# Patient Record
Sex: Female | Born: 1935 | Race: White | Hispanic: No | Smoking: Current every day smoker
Health system: Southern US, Community
[De-identification: ages and names within clinical notes are randomized; demographics above are authoritative.]

## PROBLEM LIST (undated history)

## (undated) DIAGNOSIS — I10 Essential (primary) hypertension: Secondary | ICD-10-CM

## (undated) DIAGNOSIS — R32 Unspecified urinary incontinence: Secondary | ICD-10-CM

## (undated) DIAGNOSIS — I2510 Atherosclerotic heart disease of native coronary artery without angina pectoris: Secondary | ICD-10-CM

## (undated) DIAGNOSIS — E1149 Type 2 diabetes mellitus with other diabetic neurological complication: Secondary | ICD-10-CM

## (undated) DIAGNOSIS — M4800 Spinal stenosis, site unspecified: Secondary | ICD-10-CM

## (undated) DIAGNOSIS — I8390 Asymptomatic varicose veins of unspecified lower extremity: Secondary | ICD-10-CM

## (undated) DIAGNOSIS — K219 Gastro-esophageal reflux disease without esophagitis: Secondary | ICD-10-CM

## (undated) DIAGNOSIS — E785 Hyperlipidemia, unspecified: Secondary | ICD-10-CM

## (undated) DIAGNOSIS — M545 Low back pain: Secondary | ICD-10-CM

## (undated) DIAGNOSIS — G459 Transient cerebral ischemic attack, unspecified: Secondary | ICD-10-CM

## (undated) DIAGNOSIS — E876 Hypokalemia: Secondary | ICD-10-CM

## (undated) DIAGNOSIS — Z720 Tobacco use: Secondary | ICD-10-CM

## (undated) DIAGNOSIS — M1990 Unspecified osteoarthritis, unspecified site: Secondary | ICD-10-CM

## (undated) DIAGNOSIS — F0390 Unspecified dementia without behavioral disturbance: Secondary | ICD-10-CM

## (undated) DIAGNOSIS — F329 Major depressive disorder, single episode, unspecified: Secondary | ICD-10-CM

## (undated) HISTORY — DX: Atherosclerotic heart disease of native coronary artery without angina pectoris: I25.10

## (undated) HISTORY — PX: ABDOMINAL HYSTERECTOMY: SHX81

## (undated) HISTORY — PX: VITRECTOMY: SHX106

## (undated) HISTORY — DX: Major depressive disorder, single episode, unspecified: F32.9

## (undated) HISTORY — DX: Unspecified osteoarthritis, unspecified site: M19.90

## (undated) HISTORY — PX: TOTAL KNEE ARTHROPLASTY: SHX125

## (undated) HISTORY — PX: CORONARY STENT PLACEMENT: SHX1402

## (undated) HISTORY — DX: Low back pain: M54.5

## (undated) HISTORY — DX: Gastro-esophageal reflux disease without esophagitis: K21.9

## (undated) HISTORY — DX: Unspecified urinary incontinence: R32

## (undated) HISTORY — DX: Essential (primary) hypertension: I10

## (undated) HISTORY — DX: Hyperlipidemia, unspecified: E78.5

## (undated) HISTORY — PX: VARICOSE VEIN SURGERY: SHX832

---

## 2010-07-20 ENCOUNTER — Encounter: Payer: Self-pay | Admitting: Internal Medicine

## 2010-07-20 ENCOUNTER — Ambulatory Visit
Admission: RE | Admit: 2010-07-20 | Discharge: 2010-07-20 | Payer: Self-pay | Source: Home / Self Care | Attending: Internal Medicine | Admitting: Internal Medicine

## 2010-07-20 DIAGNOSIS — E119 Type 2 diabetes mellitus without complications: Secondary | ICD-10-CM | POA: Insufficient documentation

## 2010-07-20 DIAGNOSIS — I10 Essential (primary) hypertension: Secondary | ICD-10-CM

## 2010-07-20 DIAGNOSIS — M545 Low back pain, unspecified: Secondary | ICD-10-CM

## 2010-07-20 DIAGNOSIS — M1990 Unspecified osteoarthritis, unspecified site: Secondary | ICD-10-CM | POA: Insufficient documentation

## 2010-07-20 DIAGNOSIS — F329 Major depressive disorder, single episode, unspecified: Secondary | ICD-10-CM | POA: Insufficient documentation

## 2010-07-20 DIAGNOSIS — K219 Gastro-esophageal reflux disease without esophagitis: Secondary | ICD-10-CM | POA: Insufficient documentation

## 2010-07-20 DIAGNOSIS — R32 Unspecified urinary incontinence: Secondary | ICD-10-CM

## 2010-07-20 DIAGNOSIS — F3289 Other specified depressive episodes: Secondary | ICD-10-CM

## 2010-07-20 DIAGNOSIS — E1149 Type 2 diabetes mellitus with other diabetic neurological complication: Secondary | ICD-10-CM

## 2010-07-20 DIAGNOSIS — E785 Hyperlipidemia, unspecified: Secondary | ICD-10-CM

## 2010-07-20 DIAGNOSIS — I2510 Atherosclerotic heart disease of native coronary artery without angina pectoris: Secondary | ICD-10-CM | POA: Insufficient documentation

## 2010-07-20 HISTORY — DX: Unspecified osteoarthritis, unspecified site: M19.90

## 2010-07-20 HISTORY — DX: Type 2 diabetes mellitus with other diabetic neurological complication: E11.49

## 2010-07-20 HISTORY — DX: Essential (primary) hypertension: I10

## 2010-07-20 HISTORY — DX: Hyperlipidemia, unspecified: E78.5

## 2010-07-20 HISTORY — DX: Major depressive disorder, single episode, unspecified: F32.9

## 2010-07-20 HISTORY — DX: Unspecified urinary incontinence: R32

## 2010-07-20 HISTORY — DX: Low back pain, unspecified: M54.50

## 2010-07-20 HISTORY — DX: Other specified depressive episodes: F32.89

## 2010-07-20 HISTORY — DX: Atherosclerotic heart disease of native coronary artery without angina pectoris: I25.10

## 2010-07-20 HISTORY — DX: Gastro-esophageal reflux disease without esophagitis: K21.9

## 2010-07-20 LAB — CONVERTED CEMR LAB: Blood Glucose, Fingerstick: 155

## 2010-08-05 NOTE — Letter (Signed)
Summary: PATIENT HX FORM  PATIENT HX FORM   Imported By: Georgian Co 07/20/2010 15:13:27  _____________________________________________________________________  External Attachment:    Type:   Image     Comment:   External Document

## 2010-08-05 NOTE — Assessment & Plan Note (Signed)
Summary: to be re-est/njr   Vital Signs:  Patient profile:   77 year old female Height:      61.5 inches Weight:      150 pounds BMI:     27.98 O2 Sat:      97 % on Room air Temp:     98.0 degrees F oral Pulse rate:   90 / minute BP sitting:   160 / 90  (right arm) Cuff size:   regular  Vitals Entered By: Duard Brady LPN (July 20, 2010 9:31 AM)  O2 Flow:  Room air CC: re-establish - doing well Is Patient Diabetic? Yes CBG Result 155   CC:  re-establish - doing well.  History of Present Illness: 77 -year-old patient who is seen today to reestablish with our practice.  For the past 12 years.  She has been living in [place] and has relocated with her daughter.  Medical problems include coronary artery disease.  She is status post stent in 2008.  Her chief complaint is arthritis with left knee pain and back pain.  She states that she has a history of spinal stenosis.  She has treated hypertension, type 2 diabetes, and dyslipidemia.  She denies any exertional chest pain.  Her glycemic control has been excellent  Here for Medicare AWV:  1.   Risk factors based on Past M, S, F history:  patient has hypertension, dyslipidemia, and known coronary artery disease 2.   Physical Activities: limited due to her advanced arthritis 3.   Depression/mood: history depression, presently on Cymbalta 4.   Hearing: no serious deficits 5.   ADL's: limited but no serious limitations, and independent in aspects of daily living 6.   Fall Risk: moderate due to arthritis and left knee pain 7.   Home Safety: the problems identified 8.   Height, weight, &visual acuity:height and weight stable.  No change in visual acuity is made exam 9.   Counseling: heart healthy diet modest weight loss, and increase activity.  All encouraged 10.   Labs ordered based on risk factors: will check labs next month, when it is time for a hemoglobin A1c 11.           Referral Coordination- refer for diabetic eye  exam 12.           Care Plan- heart healthy diet modest weight loss and exercise.  All encouraged 13.            Cognitive Assessment- alert and appropriate  with normal affect.  No cognitive dysfunction   Preventive Screening-Counseling & Management  Alcohol-Tobacco     Smoking Status: never  Caffeine-Diet-Exercise     Does Patient Exercise: no  Allergies (verified): 1)  ! Penicillin  Past History:  Past Medical History: Coronary artery disease Hyperlipidemia Hypertension Low back pain Osteoarthritis Urinary incontinence Diabetes mellitus, type II Depression GERD history of TIA  Past Surgical History: status post heart catheterization and stent placement 2008 right total knee replacement surgery hysterectomy 1981 vitrectomy  colonoscopy none  Family History: Reviewed history and no changes required. positive for alcoholism arthritis, dyslipidemia, coronary artery disease, Struble, vascular disease, hypertension, and diabetes  Social History: Reviewed history and no changes required. Retired Conservation officer, nature Regular exercise-no Never Smoked Smoking Status:  never Does Patient Exercise:  no  Review of Systems       The patient complains of weight gain, muscle weakness, difficulty walking, and depression.  The patient denies anorexia, fever, weight loss, vision loss, decreased hearing, hoarseness,  chest pain, syncope, dyspnea on exertion, peripheral edema, prolonged cough, headaches, hemoptysis, abdominal pain, melena, hematochezia, severe indigestion/heartburn, hematuria, incontinence, genital sores, suspicious skin lesions, transient blindness, unusual weight change, abnormal bleeding, enlarged lymph nodes, angioedema, and breast masses.    Physical Exam  General:  overweight-appearing.  140/80 Head:  Normocephalic and atraumatic without obvious abnormalities. No apparent alopecia or balding. Eyes:  No corneal or conjunctival inflammation noted. EOMI. Perrla.  Funduscopic exam benign, without hemorrhages, exudates or papilledema. Vision grossly normal. Ears:  External ear exam shows no significant lesions or deformities.  Otoscopic examination reveals clear canals, tympanic membranes are intact bilaterally without bulging, retraction, inflammation or discharge. Hearing is grossly normal bilaterally. Nose:  External nasal examination shows no deformity or inflammation. Nasal mucosa are pink and moist without lesions or exudates. Mouth:  Oral mucosa and oropharynx without lesions or exudates.  Teeth in good repair. Neck:  No deformities, masses, or tenderness noted. Chest Wall:  No deformities, masses, or tenderness noted. Lungs:  Normal respiratory effort, chest expands symmetrically. Lungs are clear to auscultation, no crackles or wheezes. Heart:  Normal rate and regular rhythm. S1 and S2 normal without gallop, murmur, click, rub or other extra sounds. Abdomen:  Bowel sounds positive,abdomen soft and non-tender without masses, organomegaly or hernias noted. Msk:  No deformity or scoliosis noted of thoracic or lumbar spine.   Pulses:  R and L carotid,radial,femoral,dorsalis pedis and posterior tibial pulses are full and equal bilaterally Extremities:  No clubbing, cyanosis, edema, or deformity noted with normal full range of motion of all joints.   Neurologic:  No cranial nerve deficits noted. Station and gait are normal. Plantar reflexes are down-going bilaterally. DTRs are symmetrical throughout. Sensory, motor and coordinative functions appear intact. Skin:  Intact without suspicious lesions or rashes Cervical Nodes:  No lymphadenopathy noted Axillary Nodes:  No palpable lymphadenopathy Inguinal Nodes:  No significant adenopathy Psych:  Cognition and judgment appear intact. Alert and cooperative with normal attention span and concentration. No apparent delusions, illusions, hallucinations  Diabetes Management Exam:    Foot Exam (with socks and/or  shoes not present):       Sensory-Pinprick/Light touch:          Left medial foot (L-4): normal          Left dorsal foot (L-5): normal          Left lateral foot (S-1): normal          Right medial foot (L-4): normal          Right dorsal foot (L-5): normal          Right lateral foot (S-1): normal       Sensory-Monofilament:          Left foot: normal          Right foot: normal       Inspection:          Left foot: normal          Right foot: normal    Eye Exam:       Eye Exam done here today          Results: normal   Impression & Recommendations:  Problem # 1:  DEPRESSION (ICD-311)  Her updated medication list for this problem includes:    Trazodone Hcl 50 Mg Tabs (Trazodone hcl) .Marland Kitchen... At bedtime as needed sleep    Cymbalta 60 Mg Cpep (Duloxetine hcl) ..... Qam  Problem # 2:  DIABETES MELLITUS, TYPE II (  ICD-250.00)  Her updated medication list for this problem includes:    Aspir-low 81 Mg Tbec (Aspirin) ..... Qd    Metformin Hcl 500 Mg Xr24h-tab (Metformin hcl) ..... Qd    Lisinopril-hydrochlorothiazide 20-25 Mg Tabs (Lisinopril-hydrochlorothiazide) ..... Qd  Problem # 3:  CORONARY ARTERY DISEASE (ICD-414.00)  Her updated medication list for this problem includes:    Felodipine 10 Mg Xr24h-tab (Felodipine) ..... Qd    Aspir-low 81 Mg Tbec (Aspirin) ..... Qd    Metoprolol Tartrate 50 Mg Tabs (Metoprolol tartrate) ..... Qd    Lisinopril-hydrochlorothiazide 20-25 Mg Tabs (Lisinopril-hydrochlorothiazide) ..... Qd  Complete Medication List: 1)  Vitamin D (ergocalciferol) 50000 Unit Caps (Ergocalciferol) .... Qmonth 2)  Felodipine 10 Mg Xr24h-tab (Felodipine) .... Qd 3)  Trazodone Hcl 50 Mg Tabs (Trazodone hcl) .... At bedtime as needed sleep 4)  Vesicare 5 Mg Tabs (Solifenacin succinate) .... Qhs 5)  Aspir-low 81 Mg Tbec (Aspirin) .... Qd 6)  Metoprolol Tartrate 50 Mg Tabs (Metoprolol tartrate) .... Qd 7)  Metformin Hcl 500 Mg Xr24h-tab (Metformin hcl) .... Qd 8)   Lisinopril-hydrochlorothiazide 20-25 Mg Tabs (Lisinopril-hydrochlorothiazide) .... Qd 9)  Cymbalta 60 Mg Cpep (Duloxetine hcl) .... Qam 10)  Simvastatin 20 Mg Tabs (Simvastatin) .... Qd 11)  Meloxicam 15 Mg Tabs (Meloxicam) .... Qd  Other Orders: Medicare -1st Annual Wellness Visit [phone])  Patient Instructions: 1)  Please schedule a follow-up appointment in 1 month. 2)  It is important that you exercise regularly at least 20 minutes 5 times a week. If you develop chest pain, have severe difficulty breathing, or feel very tired , stop exercising immediately and seek medical attention. 3)  You need to lose weight. Consider a lower calorie diet and regular exercise.  4)  Check your blood sugars regularly. If your readings are usually above : or below 70 you should contact our office. 5)  It is important that your Diabetic A1c level is checked every 3 months. 6)  See your eye doctor yearly to check for diabetic eye damage. Prescriptions: MELOXICAM 15 MG TABS (MELOXICAM) qd  #90 x 3   Entered and Authorized by:   Gordy Savers  MD   Signed by:   Gordy Savers  MD on 07/20/2010   Method used:   Print then Give to Patient   RxID:   [id number] SIMVASTATIN 20 MG TABS (SIMVASTATIN) qd  #90 x 3   Entered and Authorized by:   Gordy Savers  MD   Signed by:   Gordy Savers  MD on 07/20/2010   Method used:   Print then Give to Patient   RxID:   [id number] CYMBALTA 60 MG CPEP (DULOXETINE HCL) qam  #90 x 3   Entered and Authorized by:   Gordy Savers  MD   Signed by:   Gordy Savers  MD on 07/20/2010   Method used:   Print then Give to Patient   RxID:   [id number] LISINOPRIL-HYDROCHLOROTHIAZIDE 20-25 MG TABS (LISINOPRIL-HYDROCHLOROTHIAZIDE) qd  #90 x 3   Entered and Authorized by:   Gordy Savers  MD   Signed by:   Gordy Savers  MD on 07/20/2010   Method used:   Print then Give to Patient   RxID:   [id number] METFORMIN HCL  500 MG XR24H-TAB (METFORMIN HCL) qd  #90 x 3   Entered and Authorized by:   Gordy Savers  MD   Signed by:   Gordy Savers  MD on 07/20/2010  Method used:   Print then Give to Patient   RxID:   [id number] METOPROLOL TARTRATE 50 MG TABS (METOPROLOL TARTRATE) qd  #90 x 3   Entered and Authorized by:   Gordy Savers  MD   Signed by:   Gordy Savers  MD on 07/20/2010   Method used:   Print then Give to Patient   RxID:   [id number] VESICARE 5 MG TABS (SOLIFENACIN SUCCINATE) qhs  #90 x 3   Entered and Authorized by:   Gordy Savers  MD   Signed by:   Gordy Savers  MD on 07/20/2010   Method used:   Print then Give to Patient   RxID:   [id number] TRAZODONE HCL 50 MG TABS (TRAZODONE HCL) at bedtime as needed sleep  #90 x 3   Entered and Authorized by:   Gordy Savers  MD   Signed by:   Gordy Savers  MD on 07/20/2010   Method used:   Print then Give to Patient   RxID:   [id number] FELODIPINE 10 MG XR24H-TAB (FELODIPINE) qd  #90 x 3   Entered and Authorized by:   Gordy Savers  MD   Signed by:   Gordy Savers  MD on 07/20/2010   Method used:   Print then Give to Patient   RxID:   [id number]    Orders Added: 1)  Medicare -1st Annual Wellness Visit [G0438] 2)  New Patient Level III [phone]

## 2010-08-18 ENCOUNTER — Encounter: Payer: Self-pay | Admitting: Internal Medicine

## 2010-08-20 ENCOUNTER — Ambulatory Visit: Payer: Self-pay | Admitting: Internal Medicine

## 2011-04-05 ENCOUNTER — Other Ambulatory Visit: Payer: Self-pay

## 2011-04-05 MED ORDER — DULOXETINE HCL 60 MG PO CPEP: 60.0000 mg | ORAL_CAPSULE | Freq: Every day | ORAL | Status: DC

## 2011-04-05 MED ORDER — METFORMIN HCL ER 500 MG PO TB24: 500.0000 mg | ORAL_TABLET | Freq: Every day | ORAL | Status: DC

## 2011-04-05 NOTE — Telephone Encounter (Signed)
[facility] - will need to be seen - last seen 07/2010 with instructions to rov in 1 mos

## 2011-04-05 NOTE — Telephone Encounter (Signed)
[facility] - 90day only - was instructed to make 1 mos rov at 07/2010 appt.

## 2011-07-27 ENCOUNTER — Other Ambulatory Visit: Payer: Self-pay

## 2011-07-27 NOTE — Telephone Encounter (Signed)
Rx request for metoprolol tartarte 50 mg #90.

## 2011-07-28 ENCOUNTER — Ambulatory Visit (INDEPENDENT_AMBULATORY_CARE_PROVIDER_SITE_OTHER): Admitting: Internal Medicine

## 2011-07-28 ENCOUNTER — Encounter: Payer: Self-pay | Admitting: Internal Medicine

## 2011-07-28 DIAGNOSIS — E785 Hyperlipidemia, unspecified: Secondary | ICD-10-CM

## 2011-07-28 DIAGNOSIS — I2510 Atherosclerotic heart disease of native coronary artery without angina pectoris: Secondary | ICD-10-CM

## 2011-07-28 DIAGNOSIS — I10 Essential (primary) hypertension: Secondary | ICD-10-CM

## 2011-07-28 DIAGNOSIS — M545 Low back pain, unspecified: Secondary | ICD-10-CM

## 2011-07-28 DIAGNOSIS — E119 Type 2 diabetes mellitus without complications: Secondary | ICD-10-CM

## 2011-07-28 DIAGNOSIS — M1990 Unspecified osteoarthritis, unspecified site: Secondary | ICD-10-CM

## 2011-07-28 LAB — CBC WITH DIFFERENTIAL/PLATELET
Basophils Relative: 0.4 % (ref 0.0–3.0)
Eosinophils Absolute: 0.2 10*3/uL (ref 0.0–0.7)
Eosinophils Relative: 2.2 % (ref 0.0–5.0)
Hemoglobin: 13.8 g/dL (ref 12.0–15.0)
Lymphocytes Relative: 33.2 % (ref 12.0–46.0)
MCHC: 34.7 g/dL (ref 30.0–36.0)
Monocytes Relative: 7.9 % (ref 3.0–12.0)
Neutro Abs: 5.4 10*3/uL (ref 1.4–7.7)
Neutrophils Relative %: 56.3 % (ref 43.0–77.0)
RBC: 4.75 Mil/uL (ref 3.87–5.11)
WBC: 9.7 10*3/uL (ref 4.5–10.5)

## 2011-07-28 LAB — TSH: TSH: 0.46 u[IU]/mL (ref 0.35–5.50)

## 2011-07-28 LAB — COMPREHENSIVE METABOLIC PANEL
AST: 19 U/L (ref 0–37)
BUN: 21 mg/dL (ref 6–23)
Calcium: 9 mg/dL (ref 8.4–10.5)
Chloride: 102 mEq/L (ref 96–112)
Creatinine, Ser: 0.9 mg/dL (ref 0.4–1.2)
Glucose, Bld: 120 mg/dL — ABNORMAL HIGH (ref 70–99)

## 2011-07-28 MED ORDER — METOPROLOL SUCCINATE ER 100 MG PO TB24: 100.0000 mg | ORAL_TABLET | Freq: Every day | ORAL | Status: DC

## 2011-07-28 MED ORDER — MELOXICAM 15 MG PO TABS: 15.0000 mg | ORAL_TABLET | Freq: Every day | ORAL | Status: DC

## 2011-07-28 MED ORDER — LISINOPRIL-HYDROCHLOROTHIAZIDE 20-25 MG PO TABS: 1.0000 | ORAL_TABLET | Freq: Every day | ORAL | Status: DC

## 2011-07-28 MED ORDER — FELODIPINE ER 10 MG PO TB24: 10.0000 mg | ORAL_TABLET | Freq: Every day | ORAL | Status: DC

## 2011-07-28 MED ORDER — SOLIFENACIN SUCCINATE 10 MG PO TABS: 10.0000 mg | ORAL_TABLET | Freq: Every day | ORAL | Status: DC

## 2011-07-28 MED ORDER — SIMVASTATIN 20 MG PO TABS: 20.0000 mg | ORAL_TABLET | Freq: Every day | ORAL | Status: DC

## 2011-07-28 MED ORDER — TRAMADOL HCL 50 MG PO TABS: 50.0000 mg | ORAL_TABLET | Freq: Three times a day (TID) | ORAL | Status: AC | PRN

## 2011-07-28 MED ORDER — TRAZODONE HCL 50 MG PO TABS: 50.0000 mg | ORAL_TABLET | Freq: Every day | ORAL | Status: DC

## 2011-07-28 MED ORDER — METFORMIN HCL ER 500 MG PO TB24: 500.0000 mg | ORAL_TABLET | Freq: Every day | ORAL | Status: DC

## 2011-07-28 NOTE — Progress Notes (Signed)
  Subjective:    Patient ID: Glenda Underwood, female    DOB: 02-Nov-1932, 78 y.o.   MRN: [id number]  HPI  78 year old patient who resides in [place] and has not been seen here in one year. She has multiple medical problems including coronary artery disease. She has not been taking her aspirin faithfully she denies any chest pain. She has a history of type 2 diabetes but does track her blood sugar daily. She is maintained vasculitis in the control she has treated hypertension and dyslipidemia. Her main complaint today is low back pain which has worsened over the past 2 months. She does have a history of spinal stenosis. She requires medication refills today  Review of Systems  Constitutional: Negative for fever, appetite change, fatigue and unexpected weight change.  HENT: Negative for hearing loss, ear pain, nosebleeds, congestion, sore throat, mouth sores, trouble swallowing, neck stiffness, dental problem, voice change, sinus pressure and tinnitus.   Eyes: Negative for photophobia, pain, redness and visual disturbance.  Respiratory: Negative for cough, chest tightness and shortness of breath.   Cardiovascular: Negative for chest pain, palpitations and leg swelling.  Gastrointestinal: Negative for nausea, vomiting, abdominal pain, diarrhea, constipation, blood in stool, abdominal distention and rectal pain.  Genitourinary: Negative for dysuria, urgency, frequency, hematuria, flank pain, vaginal bleeding, vaginal discharge, difficulty urinating, genital sores, vaginal pain, menstrual problem and pelvic pain.  Musculoskeletal: Positive for back pain and arthralgias.  Skin: Negative for rash.  Neurological: Negative for dizziness, syncope, speech difficulty, weakness, light-headedness, numbness and headaches.  Hematological: Negative for adenopathy. Does not bruise/bleed easily.  Psychiatric/Behavioral: Negative for suicidal ideas, behavioral problems, self-injury, dysphoric mood and agitation. The  patient is not nervous/anxious.        Objective:   Physical Exam  Constitutional: She is oriented to person, place, and time. She appears well-developed and well-nourished.  HENT:  Head: Normocephalic.  Right Ear: External ear normal.  Left Ear: External ear normal.  Mouth/Throat: Oropharynx is clear and moist.  Eyes: Conjunctivae and EOM are normal. Pupils are equal, round, and reactive to light.  Neck: Normal range of motion. Neck supple. No thyromegaly present.       Right supraclavicular bruit  Cardiovascular: Normal rate, regular rhythm, normal heart sounds and intact distal pulses.   Pulmonary/Chest: Effort normal and breath sounds normal.  Abdominal: Soft. Bowel sounds are normal. She exhibits no mass. There is no tenderness.  Musculoskeletal: Normal range of motion.  Lymphadenopathy:    She has no cervical adenopathy.  Neurological: She is alert and oriented to person, place, and time.       Diabetic foot exam unremarkable and intact to monofilament testing and vibratory sensation  Skin: Skin is warm and dry. No rash noted.  Psychiatric: She has a normal mood and affect. Her behavior is normal.          Assessment & Plan:    Low back pain/spinal stenosis. We'll give a prescription for tramadol to assist with her pain. We'll maintain on meloxicam as well as Cymbalta Coronary artery disease. We'll resume aspirin Hypertension well controlled Dyslipidemia simvastatin refilled Diabetes mellitus. We'll check a hemoglobin A1c recheck 3 months

## 2011-07-28 NOTE — Patient Instructions (Signed)
Most patients with low back pain will improve with time over the next two to 6 weeks.  Keep active but avoid any activities that cause pain.  Apply moist heat to the low back area several times daily. Use tramadol for pain   Please check your hemoglobin A1c every 3 months Limit your sodium (Salt) intake

## 2011-07-28 NOTE — Telephone Encounter (Signed)
Rx sent to pharmacy electronically.   

## 2011-11-29 ENCOUNTER — Ambulatory Visit: Payer: Medicare Other | Admitting: Internal Medicine

## 2012-01-10 ENCOUNTER — Ambulatory Visit: Payer: Medicare Other | Admitting: Internal Medicine

## 2012-01-10 ENCOUNTER — Other Ambulatory Visit: Payer: Self-pay | Admitting: Internal Medicine

## 2012-01-10 MED ORDER — DULOXETINE HCL 60 MG PO CPEP: 60.0000 mg | ORAL_CAPSULE | Freq: Every day | ORAL | Status: DC

## 2012-01-10 MED ORDER — METFORMIN HCL ER 500 MG PO TB24: 500.0000 mg | ORAL_TABLET | Freq: Every day | ORAL | Status: DC

## 2012-01-10 MED ORDER — METOPROLOL SUCCINATE ER 100 MG PO TB24: 100.0000 mg | ORAL_TABLET | Freq: Every day | ORAL | Status: DC

## 2012-01-10 NOTE — Telephone Encounter (Signed)
Re sent to [facility] in [place]

## 2012-01-10 NOTE — Telephone Encounter (Signed)
Daughter called back. It is the [facility] on [address] in [place]. Thanks!!

## 2012-01-10 NOTE — Telephone Encounter (Signed)
[facility]s to [facility] [address] - I tried to call number given - VM - unable toleave msg - mailbox full. [facility] are 2 [facility] in [place] - is this really where they need meds sent and if so - which one - I need street name or phone #

## 2012-01-10 NOTE — Addendum Note (Signed)
Addended by: Duard Brady I on: 01/10/2012 12:53 PM   Modules accepted: Orders

## 2012-01-10 NOTE — Telephone Encounter (Signed)
Pt could not make it to appt today but is going to be out of medication. Pt requesting a one month refill on  DULoxetine (CYMBALTA) 60 MG capsule, metoprolol succinate (TOPROL-XL) 100 MG 24 hr tablet And metFORMIN (GLUCOPHAGE-XR) 500 MG 24 hr tablet  Please send to [facility] * pt will call back to sched appt for next week

## 2012-01-20 ENCOUNTER — Telehealth: Payer: Self-pay | Admitting: Internal Medicine

## 2012-01-20 MED ORDER — TRAZODONE HCL 50 MG PO TABS: 50.0000 mg | ORAL_TABLET | Freq: Every evening | ORAL | Status: DC | PRN

## 2012-01-20 NOTE — Telephone Encounter (Signed)
Please advise - last seen 07/28/11  Last written 07/28/11 #90 1RF Please advise

## 2012-01-20 NOTE — Telephone Encounter (Signed)
ok 

## 2012-01-20 NOTE — Telephone Encounter (Signed)
Daughter/Jackie calling today 01/20/12 regarding prescription for Trazadone 50 mg, needs a refill called to [facility] [phone], would like a 90 day supply.  Hereafter she needs all prescriptions sent to [facility].  Daughter said she will bring Mom in for a check up in next couple of months.  Mom lives in [place].  PLEASE CALL DAUGHTER BACK AT [phone] TO LET HER KNOW THIS HAS BEEN DONE.

## 2012-02-02 ENCOUNTER — Telehealth: Payer: Self-pay | Admitting: Internal Medicine

## 2012-02-02 MED ORDER — METOPROLOL SUCCINATE ER 100 MG PO TB24: 100.0000 mg | ORAL_TABLET | Freq: Every day | ORAL | Status: DC

## 2012-02-02 MED ORDER — SOLIFENACIN SUCCINATE 10 MG PO TABS: 10.0000 mg | ORAL_TABLET | Freq: Every day | ORAL | Status: DC

## 2012-02-02 MED ORDER — MELOXICAM 15 MG PO TABS: 15.0000 mg | ORAL_TABLET | Freq: Every day | ORAL | Status: DC

## 2012-02-02 MED ORDER — DULOXETINE HCL 60 MG PO CPEP: 60.0000 mg | ORAL_CAPSULE | Freq: Every day | ORAL | Status: DC

## 2012-02-02 MED ORDER — FELODIPINE ER 10 MG PO TB24: 10.0000 mg | ORAL_TABLET | Freq: Every day | ORAL | Status: DC

## 2012-02-02 MED ORDER — LISINOPRIL-HYDROCHLOROTHIAZIDE 20-25 MG PO TABS: 1.0000 | ORAL_TABLET | Freq: Every day | ORAL | Status: DC

## 2012-02-02 MED ORDER — METFORMIN HCL ER 500 MG PO TB24: 500.0000 mg | ORAL_TABLET | Freq: Every day | ORAL | Status: DC

## 2012-02-02 MED ORDER — SIMVASTATIN 20 MG PO TABS: 20.0000 mg | ORAL_TABLET | Freq: Every day | ORAL | Status: DC

## 2012-02-02 NOTE — Telephone Encounter (Signed)
Spoke with [place] - informed i spoke with [facility] - rx for trazodone is there and on hold - I will sent 1 -90day rx in on other meds - but she needs to be seen for future refills- last seen 07/2011 , she will call back and schedule rov

## 2012-02-02 NOTE — Telephone Encounter (Signed)
Pts daughter called and she checked with the [facility] on [address] in [place] re: traZODone (DESYREL) 50 MG tablet for 90 day supply , and was told that no med had been sent or called in for this.  Pls call in asap.   The following meds, all need refills and to be sent to [facility] 90 day supply.  DULoxetine (CYMBALTA) 60 MG capsule  felodipine (PLENDIL) 10 MG 24 hr tablet  lisinopril-hydrochlorothiazide (PRINZIDE,ZESTORETIC) 20-25 MG per tablet meloxicam (MOBIC) 15 MG tablet  metFORMIN (GLUCOPHAGE-XR) 500 MG 24 hr tablet  metoprolol succinate (TOPROL-XL) 100 MG 24 hr tablet  simvastatin (ZOCOR) 20 MG tablet  solifenacin (VESICARE) 10 MG tablet

## 2012-02-21 ENCOUNTER — Ambulatory Visit: Payer: Medicare Other | Admitting: Internal Medicine

## 2012-02-27 ENCOUNTER — Ambulatory Visit: Payer: Medicare Other | Admitting: Internal Medicine

## 2012-03-13 ENCOUNTER — Ambulatory Visit (INDEPENDENT_AMBULATORY_CARE_PROVIDER_SITE_OTHER): Payer: Medicare Other | Admitting: Internal Medicine

## 2012-03-13 ENCOUNTER — Encounter: Payer: Self-pay | Admitting: Internal Medicine

## 2012-03-13 VITALS — BP 150/80 | Temp 98.1°F | Wt 150.0 lb

## 2012-03-13 DIAGNOSIS — M1990 Unspecified osteoarthritis, unspecified site: Secondary | ICD-10-CM

## 2012-03-13 DIAGNOSIS — E119 Type 2 diabetes mellitus without complications: Secondary | ICD-10-CM

## 2012-03-13 DIAGNOSIS — F329 Major depressive disorder, single episode, unspecified: Secondary | ICD-10-CM

## 2012-03-13 DIAGNOSIS — I2510 Atherosclerotic heart disease of native coronary artery without angina pectoris: Secondary | ICD-10-CM

## 2012-03-13 DIAGNOSIS — E785 Hyperlipidemia, unspecified: Secondary | ICD-10-CM

## 2012-03-13 DIAGNOSIS — I10 Essential (primary) hypertension: Secondary | ICD-10-CM

## 2012-03-13 DIAGNOSIS — R32 Unspecified urinary incontinence: Secondary | ICD-10-CM

## 2012-03-13 LAB — POCT URINALYSIS DIPSTICK
Nitrite, UA: NEGATIVE
pH, UA: 5

## 2012-03-13 LAB — COMPREHENSIVE METABOLIC PANEL
ALT: 17 U/L (ref 0–35)
AST: 20 U/L (ref 0–37)
CO2: 28 mEq/L (ref 19–32)
Creatinine, Ser: 1 mg/dL (ref 0.4–1.2)
GFR: 59.11 mL/min — ABNORMAL LOW (ref >60.00)
Total Bilirubin: 0.3 mg/dL (ref 0.3–1.2)

## 2012-03-13 MED ORDER — SIMVASTATIN 20 MG PO TABS: 20.0000 mg | ORAL_TABLET | Freq: Every day | ORAL | Status: DC

## 2012-03-13 MED ORDER — DULOXETINE HCL 60 MG PO CPEP: 60.0000 mg | ORAL_CAPSULE | Freq: Every day | ORAL | Status: DC

## 2012-03-13 MED ORDER — LISINOPRIL-HYDROCHLOROTHIAZIDE 20-25 MG PO TABS: 1.0000 | ORAL_TABLET | Freq: Every day | ORAL | Status: DC

## 2012-03-13 MED ORDER — SOLIFENACIN SUCCINATE 10 MG PO TABS: 10.0000 mg | ORAL_TABLET | Freq: Every day | ORAL | Status: DC

## 2012-03-13 MED ORDER — TRAZODONE HCL 50 MG PO TABS: 50.0000 mg | ORAL_TABLET | Freq: Every evening | ORAL | Status: DC | PRN

## 2012-03-13 MED ORDER — CIPROFLOXACIN HCL 500 MG PO TABS: 500.0000 mg | ORAL_TABLET | Freq: Two times a day (BID) | ORAL | Status: DC

## 2012-03-13 MED ORDER — FELODIPINE ER 10 MG PO TB24: 10.0000 mg | ORAL_TABLET | Freq: Every day | ORAL | Status: DC

## 2012-03-13 MED ORDER — METOPROLOL SUCCINATE ER 100 MG PO TB24: 100.0000 mg | ORAL_TABLET | Freq: Every day | ORAL | Status: DC

## 2012-03-13 MED ORDER — CIPROFLOXACIN HCL 500 MG PO TABS: 500.0000 mg | ORAL_TABLET | Freq: Two times a day (BID) | ORAL | Status: AC

## 2012-03-13 MED ORDER — TRAMADOL HCL 50 MG PO TABS: 50.0000 mg | ORAL_TABLET | Freq: Three times a day (TID) | ORAL | Status: DC | PRN

## 2012-03-13 MED ORDER — MELOXICAM 15 MG PO TABS: 15.0000 mg | ORAL_TABLET | Freq: Every day | ORAL | Status: DC

## 2012-03-13 MED ORDER — METFORMIN HCL ER 500 MG PO TB24: 500.0000 mg | ORAL_TABLET | Freq: Every day | ORAL | Status: DC

## 2012-03-13 NOTE — Patient Instructions (Addendum)
Please check your hemoglobin A1c every 3 months  Limit your sodium (Salt) intake    It is important that you exercise regularly, at least 20 minutes 3 to 4 times per week.  If you develop chest pain or shortness of breath seek  medical attention.  Take your antibiotic as prescribed until ALL of it is gone, but stop if you develop a rash, swelling, or any side effects of the medication.  Contact our office as soon as possible if  there are side effects of the medication.

## 2012-03-13 NOTE — Progress Notes (Signed)
Subjective:    Patient ID: Glenda Underwood, female    DOB: Nov 09, 1932, 78 y.o.   MRN: [id number]  HPI  78 year old patient who is seen today for followup. She has been living out of town until recently and has not been seen here since January. She has a history of type 2 diabetes and hemoglobin A1c was 6.8 at that time. She has osteoarthritis he complains of worsening left knee pain. She has treated hypertension dyslipidemia and a history of coronary artery disease. He complains of some increasing depression. She also has some urinary frequency and urgency  Past Medical History  Diagnosis Date  . CORONARY ARTERY DISEASE 07/20/2010  . DEPRESSION 07/20/2010  . DIABETES MELLITUS, TYPE II 07/20/2010  . GERD 07/20/2010  . HYPERLIPIDEMIA 07/20/2010  . HYPERTENSION 07/20/2010  . LOW BACK PAIN 07/20/2010  . OSTEOARTHRITIS 07/20/2010  . URINARY INCONTINENCE 07/20/2010    History   Social History  . Marital Status: Widowed    Spouse Name: N/A    Number of Children: N/A  . Years of Education: N/A   Occupational History  . Not on file.   Social History Main Topics  . Smoking status: Former Games developer  . Smokeless tobacco: Never Used  . Alcohol Use: No  . Drug Use: Not on file  . Sexually Active: Not on file   Other Topics Concern  . Not on file   Social History Narrative  . No narrative on file    Past Surgical History  Procedure Date  . Abdominal hysterectomy   . Vitrectomy   . Total knee arthroplasty   . Coronary stent placement     No family history on file.  Allergies  Allergen Reactions  . Penicillins     Current Outpatient Prescriptions on File Prior to Visit  Medication Sig Dispense Refill  . aspirin 81 MG tablet Take 81 mg by mouth daily.        . DULoxetine (CYMBALTA) 60 MG capsule Take 1 capsule (60 mg total) by mouth daily.  90 capsule  0  . ergocalciferol (VITAMIN D2) 50000 UNITS capsule Take 50,000 Units by mouth once a week.        . felodipine (PLENDIL) 10 MG 24 hr  tablet Take 1 tablet (10 mg total) by mouth daily.  90 tablet  0  . lisinopril-hydrochlorothiazide (PRINZIDE,ZESTORETIC) 20-25 MG per tablet Take 1 tablet by mouth daily.  90 tablet  0  . meloxicam (MOBIC) 15 MG tablet Take 1 tablet (15 mg total) by mouth daily.  90 tablet  0  . metFORMIN (GLUCOPHAGE-XR) 500 MG 24 hr tablet Take 1 tablet (500 mg total) by mouth daily with breakfast.  90 tablet  0  . metoprolol succinate (TOPROL-XL) 100 MG 24 hr tablet Take 1 tablet (100 mg total) by mouth daily.  90 tablet  0  . simvastatin (ZOCOR) 20 MG tablet Take 1 tablet (20 mg total) by mouth daily.  90 tablet  0  . solifenacin (VESICARE) 10 MG tablet Take 1 tablet (10 mg total) by mouth daily.  90 tablet  0  . DISCONTD: traZODone (DESYREL) 50 MG tablet Take 1 tablet (50 mg total) by mouth at bedtime as needed for sleep.  90 tablet  0    BP 150/80  Temp 98.1 F (36.7 C) (Oral)  Wt 150 lb (68.04 kg)       Review of Systems  HENT: Negative for hearing loss, congestion, sore throat, rhinorrhea, dental problem, sinus pressure and tinnitus.  Eyes: Negative for pain, discharge and visual disturbance.  Respiratory: Negative for cough and shortness of breath.   Cardiovascular: Negative for chest pain, palpitations and leg swelling.  Gastrointestinal: Negative for nausea, vomiting, abdominal pain, diarrhea, constipation, blood in stool and abdominal distention.  Genitourinary: Positive for urgency and frequency. Negative for dysuria, hematuria, flank pain, vaginal bleeding, vaginal discharge, difficulty urinating, vaginal pain and pelvic pain.  Musculoskeletal: Positive for arthralgias and gait problem. Negative for joint swelling.  Skin: Negative for rash.  Neurological: Positive for weakness. Negative for dizziness, syncope, speech difficulty, numbness and headaches.  Hematological: Negative for adenopathy.  Psychiatric/Behavioral: Positive for dysphoric mood. Negative for behavioral problems and  agitation. The patient is not nervous/anxious.        Objective:   Physical Exam  Constitutional: She is oriented to person, place, and time. She appears well-developed and well-nourished.  HENT:  Head: Normocephalic.  Right Ear: External ear normal.  Left Ear: External ear normal.  Mouth/Throat: Oropharynx is clear and moist.  Eyes: Conjunctivae and EOM are normal. Pupils are equal, round, and reactive to light.  Neck: Normal range of motion. Neck supple. No thyromegaly present.  Cardiovascular: Normal rate, regular rhythm, normal heart sounds and intact distal pulses.   Pulmonary/Chest: Effort normal and breath sounds normal.  Abdominal: Soft. Bowel sounds are normal. She exhibits no mass. There is no tenderness.  Musculoskeletal: Normal range of motion.  Lymphadenopathy:    She has no cervical adenopathy.  Neurological: She is alert and oriented to person, place, and time.  Skin: Skin is warm and dry. No rash noted.  Psychiatric: She has a normal mood and affect. Her behavior is normal.          Assessment & Plan:  Diabetes mellitus. Will check a hemoglobin A1c Hypertension reasonable control. Repeat blood pressure 140/72 Dyslipidemia Osteoarthritis with left knee pain. The patient wishes to defer orthopedic evaluation at this time  Recheck 3 months All medications refilled

## 2012-03-25 ENCOUNTER — Emergency Department (HOSPITAL_COMMUNITY): Payer: Medicare Other

## 2012-03-25 ENCOUNTER — Encounter (HOSPITAL_COMMUNITY): Payer: Self-pay | Admitting: Emergency Medicine

## 2012-03-25 ENCOUNTER — Emergency Department (HOSPITAL_COMMUNITY)
Admission: EM | Admit: 2012-03-25 | Discharge: 2012-03-25 | Disposition: A | Discharge status: Home / Self Care | Payer: Medicare Other | Attending: Emergency Medicine | Admitting: Emergency Medicine

## 2012-03-25 DIAGNOSIS — K219 Gastro-esophageal reflux disease without esophagitis: Secondary | ICD-10-CM | POA: Insufficient documentation

## 2012-03-25 DIAGNOSIS — Z8673 Personal history of transient ischemic attack (TIA), and cerebral infarction without residual deficits: Secondary | ICD-10-CM | POA: Insufficient documentation

## 2012-03-25 DIAGNOSIS — E785 Hyperlipidemia, unspecified: Secondary | ICD-10-CM | POA: Insufficient documentation

## 2012-03-25 DIAGNOSIS — I2510 Atherosclerotic heart disease of native coronary artery without angina pectoris: Secondary | ICD-10-CM | POA: Insufficient documentation

## 2012-03-25 DIAGNOSIS — F17200 Nicotine dependence, unspecified, uncomplicated: Secondary | ICD-10-CM | POA: Insufficient documentation

## 2012-03-25 DIAGNOSIS — M1710 Unilateral primary osteoarthritis, unspecified knee: Secondary | ICD-10-CM | POA: Insufficient documentation

## 2012-03-25 DIAGNOSIS — M25569 Pain in unspecified knee: Secondary | ICD-10-CM

## 2012-03-25 DIAGNOSIS — I10 Essential (primary) hypertension: Secondary | ICD-10-CM | POA: Insufficient documentation

## 2012-03-25 DIAGNOSIS — Z880 Allergy status to penicillin: Secondary | ICD-10-CM | POA: Insufficient documentation

## 2012-03-25 DIAGNOSIS — M19039 Primary osteoarthritis, unspecified wrist: Secondary | ICD-10-CM | POA: Insufficient documentation

## 2012-03-25 DIAGNOSIS — F329 Major depressive disorder, single episode, unspecified: Secondary | ICD-10-CM | POA: Insufficient documentation

## 2012-03-25 DIAGNOSIS — M1990 Unspecified osteoarthritis, unspecified site: Secondary | ICD-10-CM

## 2012-03-25 DIAGNOSIS — M899 Disorder of bone, unspecified: Secondary | ICD-10-CM | POA: Insufficient documentation

## 2012-03-25 DIAGNOSIS — F3289 Other specified depressive episodes: Secondary | ICD-10-CM | POA: Insufficient documentation

## 2012-03-25 DIAGNOSIS — E119 Type 2 diabetes mellitus without complications: Secondary | ICD-10-CM | POA: Insufficient documentation

## 2012-03-25 DIAGNOSIS — M25539 Pain in unspecified wrist: Secondary | ICD-10-CM

## 2012-03-25 HISTORY — DX: Transient cerebral ischemic attack, unspecified: G45.9

## 2012-03-25 MED ORDER — ACETAMINOPHEN 325 MG PO TABS
975.0000 mg | ORAL_TABLET | Freq: Once | ORAL | Status: AC
  Administered 2012-03-25: 975 mg via ORAL
  Filled 2012-03-25: qty 3

## 2012-03-25 NOTE — ED Provider Notes (Signed)
Medical screening examination/treatment/procedure(s) were performed by non-physician practitioner and as supervising physician I was immediately available for consultation/collaboration.   Richardean Canal, MD 03/25/12 [phone]

## 2012-03-25 NOTE — ED Provider Notes (Signed)
History     CSN: [id number]  Arrival date & time 03/25/12  1349   First MD Initiated Contact with Patient 03/25/12 1516      Chief Complaint  Patient presents with  . Knee Injury  . Wrist Pain  . Back Pain    (Consider location/radiation/quality/duration/timing/severity/associated sxs/prior treatment) The history is provided by the patient, a relative and medical records.    Glenda Underwood is a 78 y.o. female presents to the emergency department complaining of right knee and wrist pain.  The onset of the symptoms was  gradual starting 10 hours ago.  The patient has associated stiffness in the joints.  The symptoms have been  persistent, stabilized.  Nothing makes the symptoms worse and nothing makes symptoms better.  The patient denies fever, chills, headache, slurred speech, changes in vision, facial droop, back pain, weakness, syncope, lightheadedness.  Daughter states the patient took a bath in the bathtub last night and rolled onto her hands and knees to push herself up out of the bathtub. Daughter is concerned that this is what aggravated the arthritis in her right hand and knee. Daughter states an intermittent episodes of confusion over the last month, no changes in that today. This is being evaluated by her primary care physician. Daughter states the patient is alert and oriented to her norm today.  Daughter states concerns over the patient not ambulating as well as she should have this morning.  Patient response this by saying that it hurts to walk.  She takes meloxicam, trazodone and tramadol at night for pain. She states this helps her sleep but does not help her during the day.  Past Medical History  Diagnosis Date  . CORONARY ARTERY DISEASE 07/20/2010  . DEPRESSION 07/20/2010  . DIABETES MELLITUS, TYPE II 07/20/2010  . GERD 07/20/2010  . HYPERLIPIDEMIA 07/20/2010  . HYPERTENSION 07/20/2010  . LOW BACK PAIN 07/20/2010  . OSTEOARTHRITIS 07/20/2010  . URINARY INCONTINENCE 07/20/2010    . TIA (transient ischemic attack)     Past Surgical History  Procedure Date  . Abdominal hysterectomy   . Vitrectomy   . Total knee arthroplasty   . Coronary stent placement     No family history on file.  History  Substance Use Topics  . Smoking status: Current Every Day Smoker  . Smokeless tobacco: Never Used  . Alcohol Use: No    OB History    Grav Para Term Preterm Abortions TAB SAB Ect Mult Living                  Review of Systems  Constitutional: Negative for fever, diaphoresis, appetite change, fatigue and unexpected weight change.  HENT: Negative for mouth sores and neck stiffness.   Eyes: Negative for visual disturbance.  Respiratory: Negative for cough, chest tightness, shortness of breath and wheezing.   Cardiovascular: Negative for chest pain.  Gastrointestinal: Negative for nausea, vomiting, abdominal pain, diarrhea and constipation.  Genitourinary: Negative for dysuria, urgency, frequency and hematuria.  Skin: Negative for rash.  Neurological: Negative for syncope, light-headedness and headaches.  Psychiatric/Behavioral: Negative for disturbed wake/sleep cycle. The patient is not nervous/anxious.     Allergies  Penicillins  Home Medications   Current Outpatient Rx  Name Route Sig Dispense Refill  . ASPIRIN 81 MG PO TABS Oral Take 81 mg by mouth daily.      . DULOXETINE HCL 60 MG PO CPEP Oral Take 1 capsule (60 mg total) by mouth daily. 90 capsule 0  Needs to be seen- last seen 07/2010 instructed to b ...  . FELODIPINE ER 10 MG PO TB24 Oral Take 1 tablet (10 mg total) by mouth daily. 90 tablet 0  . LISINOPRIL-HYDROCHLOROTHIAZIDE 20-25 MG PO TABS Oral Take 1 tablet by mouth daily. 90 tablet 0  . MELOXICAM 15 MG PO TABS Oral Take 1 tablet (15 mg total) by mouth daily. 90 tablet 0  . METFORMIN HCL ER 500 MG PO TB24 Oral Take 1 tablet (500 mg total) by mouth daily with breakfast. 90 tablet 0    Needs to be seen  . METOPROLOL SUCCINATE ER 100 MG PO  TB24 Oral Take 1 tablet (100 mg total) by mouth daily. 90 tablet 0  . SIMVASTATIN 20 MG PO TABS Oral Take 1 tablet (20 mg total) by mouth daily. 90 tablet 0  . SOLIFENACIN SUCCINATE 10 MG PO TABS Oral Take 1 tablet (10 mg total) by mouth daily. 90 tablet 0  . TRAMADOL HCL 50 MG PO TABS Oral Take 1 tablet (50 mg total) by mouth every 8 (eight) hours as needed. 60 tablet 4  . TRAZODONE HCL 50 MG PO TABS Oral Take 1 tablet (50 mg total) by mouth at bedtime as needed. 90 tablet 4  . ERGOCALCIFEROL 50000 UNITS PO CAPS Oral Take 50,000 Units by mouth once a week.        BP 115/43  Pulse 64  Temp 99.2 F (37.3 C) (Oral)  Resp 23  SpO2 96%  Physical Exam  Nursing note and vitals reviewed. Constitutional: She appears well-developed and well-nourished. No distress.  HENT:  Head: Normocephalic and atraumatic.  Mouth/Throat: Oropharynx is clear and moist. No oropharyngeal exudate.  Eyes: Conjunctivae normal and EOM are normal. Pupils are equal, round, and reactive to light. No scleral icterus.  Neck: Normal range of motion. Neck supple.       Full range of motion without pain  Cardiovascular: Normal rate, regular rhythm, normal heart sounds and intact distal pulses.  Exam reveals no gallop and no friction rub.   No murmur heard. Pulmonary/Chest: Effort normal and breath sounds normal. No respiratory distress. She has no wheezes.  Abdominal: Soft. Bowel sounds are normal. She exhibits no mass. There is no tenderness. There is no rebound and no guarding.  Musculoskeletal: Normal range of motion. She exhibits no edema.       Full range of motion with mild pain of the knees and wrists  Neurological: She is alert.       Speech is clear and goal oriented, follows commands Major Cranial nerves without deficit, no facial droop Normal strength in upper and lower extremities bilaterally including dorsiflexion and plantar flexion, strong and equal grip strength Sensation normal to light and sharp  touch Moves extremities without ataxia, coordination intact Normal finger to nose and rapid alternating movements Neg romberg, no pronator drift Normal gait with minor assistance  Skin: Skin is warm and dry. She is not diaphoretic.  Psychiatric: She has a normal mood and affect.    ED Course  Procedures (including critical care time)  Labs Reviewed - No data to display Dg Wrist Complete Right  03/25/2012  *RADIOLOGY REPORT*  Clinical Data: Fall.  Wrist pain.  RIGHT WRIST - COMPLETE 3+ VIEW  Comparison: None.  Findings: Degenerative arthropathy noted especially the first carpometacarpal junction, with chronically fragmented osteophytes in this vicinity.  No fracture or acute bony findings observed.  IMPRESSION: 1.  Prominent trapeziometacarpal degenerative arthropathy, with osteophytes and chronically fragmented osteophytes.  No acute bony findings observed.   Original Report Authenticated By: Dellia Cloud, M.D.    Dg Knee Complete 4 Views Right  03/25/2012  *RADIOLOGY REPORT*  Clinical Data: Knee injury, back pain  RIGHT KNEE - COMPLETE 4+ VIEW  Comparison: None.  Findings: Four views of the right knee submitted. Diffuse osteopenia.  There is a right knee prosthesis in anatomic alignment.  No evidence of prosthesis loosening. No acute fracture or subluxation.  IMPRESSION: No acute fracture or subluxation.  Diffuse osteopenia.  Right knee prosthesis  in anatomic alignment.  No evidence of prosthesis loosening.   Original Report Authenticated By: Natasha Mead, M.D.      1. Knee pain   2. Wrist pain   3. Arthritis pain       MDM  Glenda Underwood presents with R knee and wrist pain without injury.  Patient alert and oriented, nontoxic nonseptic appearing, neurologically intact.  X-rays without acute fracture or subluxation of the knee prosthesis in anatomic alignment. Diffuse osteopenia is seen.  Degenerative arthropathy found and the wrist.  This appears to be an exacerbation of the  patient's arthritis. I suggested treatment with Tylenol on a regular basis.  Tylenol given here in the emergency department with significant improvement in pain. Patient able to ambulate without assistance while here in the emergency department.  No evidence of TIA or other neurologic dysfunction.  I discussed these findings and my recommendations with the patient and her daughter. I have also discussed reasons to return immediately to the ER.  Patient expresses understanding and agrees with plan.  1. Medications: usual home medications, take tylenol 650mg  every 6 hours for arthritis pain 2. Treatment: rest, compression, elevation, ice 3. Follow Up: with your primary care doctor       Dierdre Forth, PA-C 03/25/12 1733

## 2012-03-25 NOTE — ED Notes (Signed)
Pt presents to ED c/o R knee pain, R wrist and upper back pain after getting out of bathtub last night. Pt did not fall, just thinks she may have strained. Pt has hx of R knee replacement(2007).

## 2012-03-25 NOTE — ED Notes (Signed)
Pt ambulated in hallway, states "My knee feels better but still hurts, I'm walking better than I was this morning". Pt walking w/o difficulty, steady w/ 1 person assist.

## 2012-03-25 NOTE — ED Notes (Signed)
Pt states she's having bil knee pain, R knee pain worse than L, R wrist pain and upper spine pain. Pt states when getting out of the bath tub last night she got on both knees and put pressure on her R wrist, did not fall though. Pt was ambulating unassisted yesterday and today is having to use walker w/ 1 person assist d/t pain in R knee. Pt is resting in at this time.

## 2012-03-26 ENCOUNTER — Telehealth: Payer: Self-pay | Admitting: Internal Medicine

## 2012-03-26 NOTE — Telephone Encounter (Signed)
[facility] Triage Call Report Triage Record Num: [id number] Operator: Griselda Miner Patient Name: Glenda Underwood Call Date & Time: 03/25/2012 12:34:30PM Patient Phone: [phone] PCP: Gordy Savers Patient Gender: Female PCP Fax : [phone] Patient DOB: [birth year] [facility] Name: [facility]  Reason for Call: Caller: Brynda Rim; PCP: Eleonore Chiquito (Family Practice); CB#: [phone]; Call regarding Unable to walk this morning; Took tub bath and got on knees to get out of tub 03/24/12 and pushed up with both arm. Right knee is the replacement knee-daughter feels like she may have damaged it bearing weight. Also complaining of pain in left knee and both wrists;. Can walk with assistance but very limited and then gives out. Daughter is afraid mother will fall. Thinks the bathtub bath last night and way she got up caused the knee and wrist problems. Notes mother was incontinent during the night (related to pain); Daughter also notes she is a little more confused; Right knee is red and swollen to what it has been this morning some bruising red dots where the pressure would have been. Daughter rates pain at rest as a 7-8/10 and daughter notes pain on knee 10/10. Triaged using Knee Injury with a disposition to go to ED due to knee locked, unbearable pain, and inability to take unassisted weight bearing steps. Care advice given-daughter demonstrated understanding and will take her to the ER.  Protocol(s) Used: Knee Injury Recommended Outcome per Protocol: See ED Immediately  Reason for Outcome: New onset of inability to take unassisted weight-bearing steps

## 2012-03-26 NOTE — Telephone Encounter (Signed)
Caller: Jackie Hildreth/Child; Phone: [phone]; Reason for Call: Daughter calling to speak with Dr.  Amador Cunas regarding a referral to an Orthopaedic doctor.  States mom was seen yesterday in the ED b/c her artificial knee was red, swollen and hurting.  States she was getting out of the tub and put her weight on the artificial knee.  Still having pain today.  Please call daughter back.  Thanks

## 2012-04-02 ENCOUNTER — Telehealth: Payer: Self-pay | Admitting: Family Medicine

## 2012-04-02 DIAGNOSIS — M25562 Pain in left knee: Secondary | ICD-10-CM

## 2012-04-02 NOTE — Telephone Encounter (Signed)
Daughter calling in requesting referral to Ortho for bilat knee OA.  R has been relplaced, L has not. Please put in. Thank you. Pt's daughter spoke with [facility] on 9/23 and was under the impression that there would be an Ortho referral put in and we would call daughter back. I don't see documentation to that effect within the call though. Joni Reining, once referral is in, can you try to get w/ the daughter right away? Her name and # are on this note. Thanks.

## 2012-04-02 NOTE — Telephone Encounter (Signed)
Please schedule orthopedic referral due  to knee arthritis;  patient has had total knee replacement surgery  and orthopedic referral should be to this physician unless family has another preference

## 2012-04-02 NOTE — Telephone Encounter (Signed)
Done and forwarded to [facility]

## 2012-04-02 NOTE — Telephone Encounter (Signed)
Please advise on referral

## 2012-04-10 ENCOUNTER — Telehealth: Payer: Self-pay | Admitting: Internal Medicine

## 2012-04-10 NOTE — Telephone Encounter (Signed)
Caller: Jackie/Child; Patient Name: Glenda Underwood; PCP: Eleonore Chiquito [facility]); Best Callback Phone Number: [phone]; Call regarding: Anxiety; seems like she has always struggled with anxiety and depression; recently moved in with her daughter; is now unable to drive; doesn't want to give up independence and gets upset about that; daughter says she used to take Klonipin; All emergent sxs of Anxiety protocol r/o except "new or worsening sxs and not currently in treatment; not taking meds/therapy; or change in medication or therapy"; disp see within 72hrs; appt scheduled for 04/11/12 aT 0915 with Dr.Kwiatkowski

## 2012-04-11 ENCOUNTER — Ambulatory Visit: Payer: Medicare Other | Admitting: Internal Medicine

## 2012-05-01 ENCOUNTER — Ambulatory Visit (INDEPENDENT_AMBULATORY_CARE_PROVIDER_SITE_OTHER): Payer: Medicare Other | Admitting: Internal Medicine

## 2012-05-01 ENCOUNTER — Encounter: Payer: Self-pay | Admitting: Internal Medicine

## 2012-05-01 ENCOUNTER — Other Ambulatory Visit: Payer: Self-pay

## 2012-05-01 VITALS — BP 160/70 | Temp 98.1°F | Wt 147.0 lb

## 2012-05-01 DIAGNOSIS — I2510 Atherosclerotic heart disease of native coronary artery without angina pectoris: Secondary | ICD-10-CM

## 2012-05-01 DIAGNOSIS — F0390 Unspecified dementia without behavioral disturbance: Secondary | ICD-10-CM

## 2012-05-01 DIAGNOSIS — Z23 Encounter for immunization: Secondary | ICD-10-CM

## 2012-05-01 DIAGNOSIS — I10 Essential (primary) hypertension: Secondary | ICD-10-CM

## 2012-05-01 DIAGNOSIS — E119 Type 2 diabetes mellitus without complications: Secondary | ICD-10-CM

## 2012-05-01 MED ORDER — DONEPEZIL HCL 5 MG PO TABS: 5.0000 mg | ORAL_TABLET | Freq: Every evening | ORAL | Status: DC | PRN

## 2012-05-01 MED ORDER — SERTRALINE HCL 25 MG PO TABS: 25.0000 mg | ORAL_TABLET | Freq: Every day | ORAL | Status: DC

## 2012-05-01 NOTE — Telephone Encounter (Signed)
Was first ordered to [facility] - local - then [facility].

## 2012-05-01 NOTE — Progress Notes (Signed)
  Subjective:    Patient ID: Glenda Underwood, female    DOB: [birth year], 78 y.o.   MRN: [id number]  HPI  78 year old patient who has a history of type 2 diabetes. She has dyslipidemia hypertension and a history of coronary artery disease. She is accompanied by her daughter today who has concerns about her memory. She requires considerable assistance in taking her medications. At times she asks about her deceased husband who has been deceased for quite some time. The patient does reside with her daughter. She has a history of type 2 diabetes which has been stable. A hemoglobin A1c was performed last month and was less than 7. Medical regimen does include VESIcare.  MMSE  performed today revealed a score of 22 out of a possible 30    Review of Systems  Constitutional: Negative.   HENT: Negative for hearing loss, congestion, sore throat, rhinorrhea, dental problem, sinus pressure and tinnitus.   Eyes: Negative for pain, discharge and visual disturbance.  Respiratory: Negative for cough and shortness of breath.   Cardiovascular: Negative for chest pain, palpitations and leg swelling.  Gastrointestinal: Negative for nausea, vomiting, abdominal pain, diarrhea, constipation, blood in stool and abdominal distention.  Genitourinary: Negative for dysuria, urgency, frequency, hematuria, flank pain, vaginal bleeding, vaginal discharge, difficulty urinating, vaginal pain and pelvic pain.  Musculoskeletal: Negative for joint swelling, arthralgias and gait problem.  Skin: Negative for rash.  Neurological: Negative for dizziness, syncope, speech difficulty, weakness, numbness and headaches.  Hematological: Negative for adenopathy.  Psychiatric/Behavioral: Positive for behavioral problems, confusion, decreased concentration and agitation. Negative for dysphoric mood. The patient is nervous/anxious.        Objective:   Physical Exam  Constitutional: She is oriented to person, place, and time. She appears  well-developed and well-nourished.  HENT:  Head: Normocephalic.  Right Ear: External ear normal.  Left Ear: External ear normal.  Mouth/Throat: Oropharynx is clear and moist.  Eyes: Conjunctivae normal and EOM are normal. Pupils are equal, round, and reactive to light.  Neck: Normal range of motion. Neck supple. No thyromegaly present.  Cardiovascular: Normal rate, regular rhythm, normal heart sounds and intact distal pulses.   Pulmonary/Chest: Effort normal and breath sounds normal.  Abdominal: Soft. Bowel sounds are normal. She exhibits no mass. There is no tenderness.  Musculoskeletal: Normal range of motion.  Lymphadenopathy:    She has no cervical adenopathy.  Neurological: She is alert and oriented to person, place, and time.       MMSE 22/30  Skin: Skin is warm and dry. No rash noted.  Psychiatric: She has a normal mood and affect. Her behavior is normal.          Assessment & Plan:  Moderately severe dementia. We'll begin Aricept. Situation discussed with the patient and daughter Diabetes mellitus. Well controlled will continue present regimen Hypertension stable Overactive bladder. Will attempt to minimize use of VESIcare Anxiety disorder. Daughter describes considerable irritability anger and anxiety. The patient does smoke heavily and consumes considerable caffeine throughout the day. Stimulus were discussed. Will start on sertraline 25 mg daily

## 2012-05-01 NOTE — Patient Instructions (Signed)
Return in 3 months for follow-up  

## 2012-05-22 ENCOUNTER — Encounter: Payer: Self-pay | Admitting: Internal Medicine

## 2012-05-22 ENCOUNTER — Ambulatory Visit (INDEPENDENT_AMBULATORY_CARE_PROVIDER_SITE_OTHER): Payer: Medicare Other | Admitting: Internal Medicine

## 2012-05-22 VITALS — BP 170/70 | HR 72 | Temp 98.1°F | Resp 18 | Wt 139.0 lb

## 2012-05-22 DIAGNOSIS — I10 Essential (primary) hypertension: Secondary | ICD-10-CM

## 2012-05-22 DIAGNOSIS — E119 Type 2 diabetes mellitus without complications: Secondary | ICD-10-CM

## 2012-05-22 DIAGNOSIS — F0390 Unspecified dementia without behavioral disturbance: Secondary | ICD-10-CM

## 2012-05-22 NOTE — Progress Notes (Signed)
  Subjective:    Patient ID: Glenda Underwood, female    DOB: [birth year], 78 y.o.   MRN: [id number]  HPI  78 year old patient who is seen today in followup. She has a history of moderate dementia and is accompanied by her daughter. She requires considerable assistance with aspects of daily living and distention of her medications.  The daughter is seeking alternative living arrangements do to the fact that she is becoming overwhelmed. The patient is quite forgetful and at times continues to drive a car. Her daughter has had a difficult time controlling her behavior and there is considerable interpersonal stresses.    Review of Systems  Constitutional: Negative.   HENT: Negative for hearing loss, congestion, sore throat, rhinorrhea, dental problem, sinus pressure and tinnitus.   Eyes: Negative for pain, discharge and visual disturbance.  Respiratory: Negative for cough and shortness of breath.   Cardiovascular: Negative for chest pain, palpitations and leg swelling.  Gastrointestinal: Negative for nausea, vomiting, abdominal pain, diarrhea, constipation, blood in stool and abdominal distention.  Genitourinary: Negative for dysuria, urgency, frequency, hematuria, flank pain, vaginal bleeding, vaginal discharge, difficulty urinating, vaginal pain and pelvic pain.  Musculoskeletal: Negative for joint swelling, arthralgias and gait problem.  Skin: Negative for rash.  Neurological: Negative for dizziness, syncope, speech difficulty, weakness, numbness and headaches.  Hematological: Negative for adenopathy.  Psychiatric/Behavioral: Positive for behavioral problems, confusion and agitation. Negative for dysphoric mood. The patient is not nervous/anxious.        Objective:   Physical Exam  Constitutional: She is oriented to person, place, and time. She appears well-developed and well-nourished. She appears distressed.  HENT:  Head: Normocephalic.  Right Ear: External ear normal.  Left Ear: External  ear normal.  Mouth/Throat: Oropharynx is clear and moist.  Eyes: Conjunctivae normal and EOM are normal. Pupils are equal, round, and reactive to light.  Neck: Normal range of motion. Neck supple. No thyromegaly present.  Cardiovascular: Normal rate, regular rhythm, normal heart sounds and intact distal pulses.   Pulmonary/Chest: Effort normal and breath sounds normal.  Abdominal: Soft. Bowel sounds are normal. She exhibits no mass. There is no tenderness.  Musculoskeletal: Normal range of motion.  Lymphadenopathy:    She has no cervical adenopathy.  Neurological: She is alert and oriented to person, place, and time.  Skin: Skin is warm and dry. No rash noted.  Psychiatric:       Slightly agitated          Assessment & Plan:   Dementia. Considerable information on diagnosis and resources dispensed Patient has an appointment in approximately 3 weeks we'll reassess at that time Hypertension stable

## 2012-05-22 NOTE — Patient Instructions (Signed)
Return as scheduled for followup next month

## 2012-05-25 ENCOUNTER — Emergency Department (HOSPITAL_COMMUNITY)
Admission: EM | Admit: 2012-05-25 | Discharge: 2012-05-29 | Disposition: A | Discharge status: Home / Self Care | Payer: Medicare Other | Attending: Emergency Medicine | Admitting: Emergency Medicine

## 2012-05-25 ENCOUNTER — Encounter (HOSPITAL_COMMUNITY): Payer: Self-pay | Admitting: Emergency Medicine

## 2012-05-25 DIAGNOSIS — Z7982 Long term (current) use of aspirin: Secondary | ICD-10-CM | POA: Insufficient documentation

## 2012-05-25 DIAGNOSIS — E119 Type 2 diabetes mellitus without complications: Secondary | ICD-10-CM | POA: Insufficient documentation

## 2012-05-25 DIAGNOSIS — M1990 Unspecified osteoarthritis, unspecified site: Secondary | ICD-10-CM | POA: Insufficient documentation

## 2012-05-25 DIAGNOSIS — Z8673 Personal history of transient ischemic attack (TIA), and cerebral infarction without residual deficits: Secondary | ICD-10-CM | POA: Insufficient documentation

## 2012-05-25 DIAGNOSIS — E785 Hyperlipidemia, unspecified: Secondary | ICD-10-CM | POA: Insufficient documentation

## 2012-05-25 DIAGNOSIS — Z9861 Coronary angioplasty status: Secondary | ICD-10-CM | POA: Insufficient documentation

## 2012-05-25 DIAGNOSIS — E872 Acidosis, unspecified: Secondary | ICD-10-CM | POA: Insufficient documentation

## 2012-05-25 DIAGNOSIS — Z8719 Personal history of other diseases of the digestive system: Secondary | ICD-10-CM | POA: Insufficient documentation

## 2012-05-25 DIAGNOSIS — F17200 Nicotine dependence, unspecified, uncomplicated: Secondary | ICD-10-CM | POA: Insufficient documentation

## 2012-05-25 DIAGNOSIS — Z8739 Personal history of other diseases of the musculoskeletal system and connective tissue: Secondary | ICD-10-CM | POA: Insufficient documentation

## 2012-05-25 DIAGNOSIS — F329 Major depressive disorder, single episode, unspecified: Secondary | ICD-10-CM | POA: Insufficient documentation

## 2012-05-25 DIAGNOSIS — E876 Hypokalemia: Secondary | ICD-10-CM | POA: Insufficient documentation

## 2012-05-25 DIAGNOSIS — I10 Essential (primary) hypertension: Secondary | ICD-10-CM | POA: Insufficient documentation

## 2012-05-25 DIAGNOSIS — R32 Unspecified urinary incontinence: Secondary | ICD-10-CM | POA: Insufficient documentation

## 2012-05-25 DIAGNOSIS — Z79899 Other long term (current) drug therapy: Secondary | ICD-10-CM | POA: Insufficient documentation

## 2012-05-25 DIAGNOSIS — I2510 Atherosclerotic heart disease of native coronary artery without angina pectoris: Secondary | ICD-10-CM | POA: Insufficient documentation

## 2012-05-25 DIAGNOSIS — F0390 Unspecified dementia without behavioral disturbance: Secondary | ICD-10-CM | POA: Insufficient documentation

## 2012-05-25 DIAGNOSIS — F3289 Other specified depressive episodes: Secondary | ICD-10-CM | POA: Insufficient documentation

## 2012-05-25 LAB — COMPREHENSIVE METABOLIC PANEL
BUN: 19 mg/dL (ref 6–23)
CO2: 16 mEq/L — ABNORMAL LOW (ref 19–32)
Chloride: 97 mEq/L (ref 96–112)
Creatinine, Ser: 1.13 mg/dL — ABNORMAL HIGH (ref 0.50–1.10)
GFR calc Af Amer: 53 mL/min — ABNORMAL LOW (ref >90)
GFR calc non Af Amer: 46 mL/min — ABNORMAL LOW (ref >90)
Glucose, Bld: 128 mg/dL — ABNORMAL HIGH (ref 70–99)
Total Bilirubin: 0.4 mg/dL (ref 0.3–1.2)

## 2012-05-25 LAB — URINALYSIS, ROUTINE W REFLEX MICROSCOPIC
Glucose, UA: NEGATIVE mg/dL
Protein, ur: NEGATIVE mg/dL
Specific Gravity, Urine: 1.006 (ref 1.005–1.030)

## 2012-05-25 LAB — CBC
HCT: 38.3 % (ref 36.0–46.0)
MCH: 27.7 pg (ref 26.0–34.0)
MCV: 82.4 fL (ref 78.0–100.0)
RBC: 4.65 MIL/uL (ref 3.87–5.11)
WBC: 10.6 10*3/uL — ABNORMAL HIGH (ref 4.0–10.5)

## 2012-05-25 LAB — LACTIC ACID, PLASMA: Lactic Acid, Venous: 0.9 mmol/L (ref 0.5–2.2)

## 2012-05-25 LAB — URINE MICROSCOPIC-ADD ON

## 2012-05-25 LAB — ETHANOL: Alcohol, Ethyl (B): 16 mg/dL — ABNORMAL HIGH (ref 0–11)

## 2012-05-25 LAB — RAPID URINE DRUG SCREEN, HOSP PERFORMED: Amphetamines: NOT DETECTED

## 2012-05-25 LAB — SALICYLATE LEVEL: Salicylate Lvl: <2 mg/dL — ABNORMAL LOW (ref 2.8–20.0)

## 2012-05-25 MED ORDER — POTASSIUM CHLORIDE CRYS ER 20 MEQ PO TBCR
40.0000 meq | EXTENDED_RELEASE_TABLET | Freq: Once | ORAL | Status: AC
  Administered 2012-05-25: 40 meq via ORAL
  Filled 2012-05-25: qty 2

## 2012-05-25 MED ORDER — SODIUM CHLORIDE 0.9 % IV SOLN: INTRAVENOUS | Status: DC

## 2012-05-25 MED ORDER — POTASSIUM CHLORIDE 10 MEQ/100ML IV SOLN
10.0000 meq | INTRAVENOUS | Status: AC
Start: 2012-05-25 — End: 2012-05-26
  Administered 2012-05-25 – 2012-05-26 (×2): 10 meq via INTRAVENOUS
  Filled 2012-05-25 (×2): qty 100

## 2012-05-25 MED ORDER — POTASSIUM CHLORIDE CRYS ER 20 MEQ PO TBCR
40.0000 meq | EXTENDED_RELEASE_TABLET | Freq: Once | ORAL | Status: AC
  Administered 2012-05-26: 40 meq via ORAL
  Filled 2012-05-25: qty 2

## 2012-05-25 MED ORDER — SODIUM CHLORIDE 0.9 % IV BOLUS (SEPSIS)
700.0000 mL | Freq: Once | INTRAVENOUS | Status: AC
  Administered 2012-05-25: 700 mL via INTRAVENOUS

## 2012-05-25 NOTE — ED Notes (Signed)
telepsych faxed to consult.
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
## 2012-05-25 NOTE — ED Notes (Signed)
MD at bedside. 

## 2012-05-25 NOTE — ED Notes (Addendum)
Patient's daughter states that patient has had dementia for 2 years which has worsened over the last two months.  Patient has been drinking alcohol, smoking cigarettes in bed, driving with a medically cancelled license, mixing her medicines up, being verbally abusive toward family, and leaving the house and walking away during all hours of the day/night.  Patient lives with daughter and daughter states, "She can't stay with me anymore, she won't be living with me for much longer.  She isn't cooperating with me, and she's been exhibiting persecutory behavior."

## 2012-05-26 LAB — BASIC METABOLIC PANEL
Calcium: 8.6 mg/dL (ref 8.4–10.5)
Creatinine, Ser: 0.89 mg/dL (ref 0.50–1.10)
GFR calc non Af Amer: 61 mL/min — ABNORMAL LOW (ref >90)
Glucose, Bld: 120 mg/dL — ABNORMAL HIGH (ref 70–99)
Sodium: 135 mEq/L (ref 135–145)

## 2012-05-26 LAB — GLUCOSE, CAPILLARY: Glucose-Capillary: 147 mg/dL — ABNORMAL HIGH (ref 70–99)

## 2012-05-26 MED ORDER — LORAZEPAM 2 MG/ML IJ SOLN
2.0000 mg | Freq: Once | INTRAMUSCULAR | Status: AC
  Administered 2012-05-26: 2 mg via INTRAMUSCULAR
  Filled 2012-05-26: qty 1

## 2012-05-26 MED ORDER — SIMVASTATIN 20 MG PO TABS
20.0000 mg | ORAL_TABLET | Freq: Every day | ORAL | Status: DC
  Administered 2012-05-26 – 2012-05-28 (×3): 20 mg via ORAL
  Filled 2012-05-26 (×5): qty 1

## 2012-05-26 MED ORDER — DULOXETINE HCL 60 MG PO CPEP
60.0000 mg | ORAL_CAPSULE | Freq: Every day | ORAL | Status: DC
  Administered 2012-05-26 – 2012-05-29 (×4): 60 mg via ORAL
  Filled 2012-05-26 (×4): qty 1

## 2012-05-26 MED ORDER — SERTRALINE HCL 50 MG PO TABS
25.0000 mg | ORAL_TABLET | Freq: Every day | ORAL | Status: DC
  Administered 2012-05-26 – 2012-05-29 (×4): 25 mg via ORAL
  Filled 2012-05-26 (×4): qty 1

## 2012-05-26 MED ORDER — DONEPEZIL HCL 5 MG PO TABS
5.0000 mg | ORAL_TABLET | Freq: Every evening | ORAL | Status: DC | PRN
  Administered 2012-05-28: 5 mg via ORAL
  Filled 2012-05-26 (×2): qty 1

## 2012-05-26 MED ORDER — LORAZEPAM 1 MG PO TABS
1.0000 mg | ORAL_TABLET | Freq: Three times a day (TID) | ORAL | Status: DC | PRN
  Administered 2012-05-26 – 2012-05-28 (×3): 1 mg via ORAL
  Filled 2012-05-26 (×5): qty 1

## 2012-05-26 MED ORDER — METOPROLOL SUCCINATE ER 100 MG PO TB24
100.0000 mg | ORAL_TABLET | Freq: Every day | ORAL | Status: DC
  Administered 2012-05-26 – 2012-05-29 (×4): 100 mg via ORAL
  Filled 2012-05-26 (×4): qty 1

## 2012-05-26 MED ORDER — TRAZODONE HCL 50 MG PO TABS
150.0000 mg | ORAL_TABLET | Freq: Every day | ORAL | Status: DC
  Administered 2012-05-26: 150 mg via ORAL
  Administered 2012-05-27: 50 mg via ORAL
  Administered 2012-05-27: 100 mg via ORAL
  Administered 2012-05-28: 150 mg via ORAL
  Filled 2012-05-26: qty 3
  Filled 2012-05-26 (×2): qty 2

## 2012-05-26 MED ORDER — DARIFENACIN HYDROBROMIDE ER 7.5 MG PO TB24
7.5000 mg | ORAL_TABLET | Freq: Every day | ORAL | Status: DC
  Administered 2012-05-26 – 2012-05-29 (×4): 7.5 mg via ORAL
  Filled 2012-05-26 (×4): qty 1

## 2012-05-26 MED ORDER — MELOXICAM 15 MG PO TABS
15.0000 mg | ORAL_TABLET | Freq: Every day | ORAL | Status: DC
  Administered 2012-05-26 – 2012-05-29 (×4): 15 mg via ORAL
  Filled 2012-05-26 (×4): qty 1

## 2012-05-26 MED ORDER — FELODIPINE ER 10 MG PO TB24
10.0000 mg | ORAL_TABLET | Freq: Every day | ORAL | Status: DC
  Administered 2012-05-26 – 2012-05-29 (×4): 10 mg via ORAL
  Filled 2012-05-26 (×4): qty 1

## 2012-05-26 MED ORDER — ACETAMINOPHEN 325 MG PO TABS
650.0000 mg | ORAL_TABLET | ORAL | Status: DC | PRN
  Administered 2012-05-28: 650 mg via ORAL
  Filled 2012-05-26: qty 2

## 2012-05-26 MED ORDER — HALOPERIDOL LACTATE 5 MG/ML IJ SOLN
2.0000 mg | Freq: Once | INTRAMUSCULAR | Status: AC
  Administered 2012-05-26: 2 mg via INTRAMUSCULAR
  Filled 2012-05-26: qty 1

## 2012-05-26 MED ORDER — METFORMIN HCL ER 500 MG PO TB24
500.0000 mg | ORAL_TABLET | Freq: Every day | ORAL | Status: DC
  Administered 2012-05-27 – 2012-05-29 (×3): 500 mg via ORAL
  Filled 2012-05-26 (×4): qty 1

## 2012-05-26 MED ORDER — LISINOPRIL-HYDROCHLOROTHIAZIDE 20-25 MG PO TABS: 1.0000 | ORAL_TABLET | Freq: Every day | ORAL | Status: DC

## 2012-05-26 MED ORDER — IBUPROFEN 200 MG PO TABS: 600.0000 mg | ORAL_TABLET | Freq: Three times a day (TID) | ORAL | Status: DC | PRN

## 2012-05-26 MED ORDER — INSULIN ASPART 100 UNIT/ML ~~LOC~~ SOLN
0.0000 [IU] | Freq: Every day | SUBCUTANEOUS | Status: DC
  Filled 2012-05-26: qty 1

## 2012-05-26 MED ORDER — INSULIN ASPART 100 UNIT/ML ~~LOC~~ SOLN
0.0000 [IU] | Freq: Three times a day (TID) | SUBCUTANEOUS | Status: DC
  Administered 2012-05-26 – 2012-05-28 (×3): 2 [IU] via SUBCUTANEOUS
  Filled 2012-05-26: qty 2
  Filled 2012-05-26: qty 1

## 2012-05-26 MED ORDER — LISINOPRIL 20 MG PO TABS
20.0000 mg | ORAL_TABLET | Freq: Every day | ORAL | Status: DC
  Administered 2012-05-26 – 2012-05-29 (×4): 20 mg via ORAL
  Filled 2012-05-26 (×4): qty 1

## 2012-05-26 MED ORDER — TRAMADOL HCL 50 MG PO TABS
50.0000 mg | ORAL_TABLET | Freq: Every day | ORAL | Status: DC
  Administered 2012-05-27 – 2012-05-28 (×2): 50 mg via ORAL
  Filled 2012-05-26 (×2): qty 1

## 2012-05-26 MED ORDER — HYDROCHLOROTHIAZIDE 25 MG PO TABS
25.0000 mg | ORAL_TABLET | Freq: Every day | ORAL | Status: DC
Start: 2012-05-26 — End: 2012-05-29
  Administered 2012-05-26 – 2012-05-29 (×4): 25 mg via ORAL
  Filled 2012-05-26 (×4): qty 1

## 2012-05-26 MED ORDER — LORAZEPAM 1 MG PO TABS
1.0000 mg | ORAL_TABLET | Freq: Once | ORAL | Status: AC
  Administered 2012-05-26: 1 mg via ORAL
  Filled 2012-05-26: qty 1

## 2012-05-26 NOTE — ED Provider Notes (Signed)
Care assumed at sign out. [name] is a 78 yo F hx DM, dementia here with worsening dementia and depression. Initially was under IVC and psych consulted and rescinded the IVC but recommend social work eval. Social work talked to her and the daughter. The daughter said that no one can take care of her at home and she needs assisted living. Patient now became agitated and wanted to leave. I feel that she needs another IVC as she is not safe to go home. Will re consult psych for capacity eval. She is pending placement for assisted living.   Richardean Canal, MD 05/26/12 1316

## 2012-05-26 NOTE — ED Provider Notes (Signed)
  Physical Exam  BP 178/60  Pulse 68  Temp 98.4 F (36.9 C) (Oral)  Resp 16  Ht 5\' 1"  (1.549 m)  Wt 150 lb (68.04 kg)  BMI 28.34 kg/m2  SpO2 98%  Physical Exam  ED Course  Procedures  MDM BMP is improved. Telepsych has been done and IVC is been removed. The recommendation was for social work to see patient.      Juliet Rude. Rubin Payor, MD 05/26/12 [phone]

## 2012-05-26 NOTE — ED Notes (Signed)
Patient is continuously trying to leave unit. Very agitated. Loud and boisterous. Not following commands. Security had to be called to help her redirect her into room. Not cooperating.

## 2012-05-26 NOTE — ED Notes (Signed)
Patient tried to leave unit. Was stopped by Producer, television/film/video.Patient is uncooperative.  Patient got loud and yelled at staff. Stated "I just want to get out of here." Patient needs continuous monitoring and redirecting.

## 2012-05-26 NOTE — ED Notes (Signed)
Pt's daughter stated she will be here tomorrow to visit.  Pt has been combative and non- compliant.  Pt wanders in the hallway and has been redirected several time by staff and security.  Pt raises her voice at staff.

## 2012-05-26 NOTE — ED Notes (Signed)
Patient impatiently pacing halls, trying to get to side doors of TCU- sts she is thinking and trying to think of phone numbers. Sitter, security and RN informed her that she needs to stay in her room.

## 2012-05-26 NOTE — ED Notes (Signed)
[id number]<ZH> Expected date:<BR> Expected time:<BR> Means of arrival:<BR> Comments:<BR> Hold for 6

## 2012-05-26 NOTE — ED Notes (Addendum)
Patient is noncompliant with verbal commands and is verbally abusive to staff. States she " want to look in a closet for a jacket" so that she "can walk down the road." Patient gets visibly agitated when we talk to her and do not give in to her demands, becomes loud, disruptive and uncooperative. Patient is repeatedly redirected and reoriented to place and time, very confused. Forgets things that were recently told to her and asks the same questions repeatedly.  -sitter CS

## 2012-05-26 NOTE — ED Notes (Signed)
ZOX:[id number]<AV> Expected date:05/26/12<BR> Expected time:12:30 PM<BR> Means of arrival:Ambulance<BR> Comments:<BR> Urosepsis from Urgent Care

## 2012-05-26 NOTE — ED Notes (Signed)
Patient having auditory hallucinations, states she hears her dogs barking. Also states she hears her daughter and son and that they are in the hallway talking. No one is in the hallway.

## 2012-05-26 NOTE — ED Notes (Signed)
Pt is unsteady on feet. Continuously coming out of room. Not listening to verbal commands to return to room. Stated to sitter "I'll give you $200 to let me go." Verbally abusive toward staff. Restless and agitated.

## 2012-05-26 NOTE — ED Notes (Signed)
Tele-psych initiated, spoke with Marcelino Duster for specialist on call.  Paperwork faxed.

## 2012-05-26 NOTE — ED Notes (Signed)
Social worker here conversing with patient. Will get in contact with daughter & converse with MD

## 2012-05-26 NOTE — ED Notes (Signed)
Patient tried to crawl into bed while the bed rails were up, tried to assist patient by lowering bed rail. Explained to patient what I was doing, patient became belligerent, loud and verbally abusive. Does not respond to verbal commands. Repeatedly having to be redirected. Not cooperative with staff.

## 2012-05-26 NOTE — ED Notes (Signed)
'  Departure condition' charted on wrong pt, please disregard, unable to delete.

## 2012-05-26 NOTE — ED Notes (Signed)
Patient is restless. Lays down, gets up out of bed, walks around room, and sits down again. Appears anxious. States repeatedly that she hears her daughters voice and that she is here someplace. Keeps getting up and says "I need to find [place]. I hear Annice Pih. I am going to go out and look for them." Patient is reoriented frequently to her location.

## 2012-05-26 NOTE — ED Notes (Signed)
Pt states that she "needs to get her purse and medications". Pt was advised that we have everything and it is safely locked up. Pt continues to ask for medications. Pt becomes agitated, demanding, and loud toward staff. Pt trying walk through doorways and leave pt area.

## 2012-05-26 NOTE — ED Notes (Signed)
Patient left her room and went into hallway. Was redirected by staff. Patient got upset. She was shaking from how mad patient got. Patient sat herself on the floor. Was loud when speaking to staff.

## 2012-05-26 NOTE — ED Notes (Addendum)
Patient is resting comfortably. Sitter at bedside will monitor.

## 2012-05-26 NOTE — ED Notes (Signed)
Patient is uncooperative. Not listening or following commands. Nurse was going to give patient medicine and patient stated "I know you're trying to kill me. Enjoy yourself. Smile while you do it."

## 2012-05-26 NOTE — ED Notes (Signed)
Patient is again verbally abusive. Called sitter "hateful asshole." Patient is getting loud and belligerent with staff members. Continuously stepping out of room and not following commands to stay in her room.

## 2012-05-26 NOTE — ED Notes (Signed)
[id number]<AV> Expected date:<BR> Expected time:<BR> Means of arrival:<BR> Comments:<BR> Hold for room 3 per Charge.
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
## 2012-05-27 LAB — GLUCOSE, CAPILLARY
Glucose-Capillary: 120 mg/dL — ABNORMAL HIGH (ref 70–99)
Glucose-Capillary: 104 mg/dL — ABNORMAL HIGH (ref 70–99)
Glucose-Capillary: 120 mg/dL — ABNORMAL HIGH (ref 70–99)

## 2012-05-27 NOTE — ED Provider Notes (Signed)
BP 179/72  Pulse 56  Temp 98.5 F (36.9 C) (Axillary)  Resp 20  Ht 5\' 1"  (1.549 m)  Wt 150 lb (68.04 kg)  BMI 28.34 kg/m2  SpO2 100%  Pt rested well overnight. IVC papers in place. Pending SW eval today for placement  Loren Racer, MD 05/27/12 0730

## 2012-05-27 NOTE — ED Notes (Signed)
Psychiatrist from [facility] evaluated pt yesterday and deemed pt to have capacity to make her medical decisions. CSW notified pt's daughter of results from psychiatric evaluation. Pt's daughter verbalized her disagreement in regard to pt having capacity to make her decisions, evidenced by pt's daughter stating that pt can "present" appropriate when she needs to. Pt's daughter desires for pt to be evaluated by on-site psychiatrist. CSW explained that on-site psychiatrist is not available on weekends. Pt's daughter reported to CSW that she will be at [facility] today to see her mother and also speak with MD if possible. CSW will notify MD of pt's daughter concerns and desire for further evaluation by on-site psychiatrist. Disposition pending.  Janann Colonel., MSW, [facility] Weekend ED Clinical Social Worker [phone]

## 2012-05-27 NOTE — ED Notes (Signed)
CBG 120 

## 2012-05-28 ENCOUNTER — Telehealth: Payer: Self-pay | Admitting: Internal Medicine

## 2012-05-28 NOTE — Progress Notes (Signed)
CSW received call from pt PCP, and discussed patient disposition and discharge plan. At this time pt is awaiting another telepsych consultation regarding capacity. Pt PCP agreed regarding another consultation for capacity at this time.   [name], LCSWA  [phone] .05/28/2012 1711pm

## 2012-05-28 NOTE — ED Notes (Signed)
Made attending ED doctor aware of elevated BP - reviewed trends and will make adjustment for daily BP medications

## 2012-05-28 NOTE — Progress Notes (Signed)
CSW spoke with pt daughter by phone to discuss patient disposition needs. At this time, pt does not have capacity to make her own decisions. When CSW met with pt after telepsych consultation, pt was determined that she was going to go home with nurse tech and that he did not work here and was a friend of hers from [place].   CSW informed patient daughter, that alf placement will be initiated. However depending upon process of alf patient may not be able to complete placement while staying in the hospital. Pt daughter verbalized understanding.   CSW will complete fl2 and placed in pt chart for MD signature.  CSW will submit pasarr for patient.    Day CSW will follow up with pt daughter regarding alf process. Pt daughter requested [facility] in [place] but open to others in [place].    [name], LCSWA  [phone] .05/28/2012 8:30pm
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
## 2012-05-28 NOTE — Progress Notes (Signed)
[facility] ED CM spoke with pt's son when he came to visit pt.  He states pt can not be d/c home.  He reports he was dropped off by his sister and he does not drive. Cm reviewed with son that pt was found to have capacity to make her own decisions Son informed that pcp is aware of plan of d/c home with home health services including a SW to assist with placement if pt agrees to placement.  Son received home health resources and private duty nursing services.  Son left TCU to stating he was going to call to find a ride home. Son observed removing keys from his pocket,  leaving out [facility] ED to a white pick up truck and leaving the visitors parking lot.

## 2012-05-28 NOTE — Telephone Encounter (Signed)
Received call from Kim case manager(# [phone]) for Glenda Underwood. Pt is in the ED since 05/25/2012 does not approve for admission. Selena Batten wants to know if you want her to initiate FL2 and Home Health? The Social Worker assigned to her case is [name] [phone]) Please advise.

## 2012-05-28 NOTE — Progress Notes (Signed)
CSW met with pt and pt son at bedside to discuss pt disposition and discharge plans. At this time, pt has been psychiatrically and medically cleared for discharge. Pt was seen by telepsych who stated that patient has capacity at this time. Pt adamantly refused assisted living placement as suggested by pt family. Pt family feel that pt is risk to self at home. However at this time, pt does not present behaviors that would make pt a threat to self or others.   CSW and pt did discuss driving without a license and consequences. Pt agreed to no longer drive without a license.   Pt son stated that something needed to be done in order to ensure safety of patient at home. Pt son stated that family members would have to collaborate in some way. Pt son stated that pt family would follow up with home health services and explore private duty services.   At that time, pt son stated that he would call patient daughter. CSW offered patient son a phone to use to call. Pt son refused to use phone in emergency department. CSW asked pt son to provide transportation home. Pt son stated that he was brought to the hosptial by patient sister and was waiting to be picked up claiming that he was unable to drive. Pt son left the TCU area. CSW asked security to follow patient son to see if patient son left the emergency department. Pt son got in a white pick up truck independently and drove away from the hosptial. CSW does not have a contact number for patient son. CSW left message with pt daughter Jeryl Columbia at [phone]. From discussion with CSW colleague, pt daughter Jeryl Columbia, has been refusing to answer phone calls.   CSW will make APS report. At this time Pt is felt to have capacity. [facility] and SW will be working with patient to see if patient is able to provide safety for pt self at home.   At this time patient is being discharged home.   CSW met with pt at bedside again to discuss transportation home. CSW was  concerned because patient stated, "Now this man (pointing to patient sitter) is in my room all the time, but he does not sleep in the bed with me. I am going to lay down and get some more sleep before going home tomorrow. But I do not want this to be the topic of conversation because he will not be laying in the bed with me." CSW asked patient where she was. Pt stated, "I'm in [place]. CSW asked patient where in [place]. Patient stated, "I'm in a motel." CSW asked patient what the date was. Pt stated, "if you weren't standing here asking me, I could tell you." Patient stated November 15. CSW asked patient what year. Pt replied, "21....and began to laugh."   CSW is concerned regarding patient capacity due to reasoning of wanting to lay down and sleep so she could travel, even though patient lives in [place] not far from the hospital. CSW is also concerned because patient felt she was in a hotel. CSW spoke with pt tech who sat with patient during lunch. The Nurse tech stated that pt was believing that there was another man in her room in another bed.     CSW is requesting another tele psych regarding capacity and possible confusin rule out hallucinations.   [name], LCSWA  [phone] .05/28/2012 1556pm

## 2012-05-28 NOTE — Progress Notes (Signed)
Home health services choices offered as  HOME HEALTH AGENCIES SERVING [place]   Agencies that are Medicare-Certified and are affiliated with The [facility] Agency  Telephone Number Address  [facility]   The [facility] has ownership interest in this company; however, you are under no obligation to use this agency. [phone] or  [phone] [address] [place], [place] [address] [email or web address]   Agencies that are Medicare-Certified and are not affiliated with The [facility] Agency Telephone Number Address  [facility] [phone] Fax [phone] [address] [place], [place]  [address] [email or web address]  [facility] [phone] or [phone] Fax [phone] [address] [place], [place] [address] [email or web address]  [facility] [phone] Fax [phone] [address] [place], [place] [address] [email or web address]  [facility] [phone] Fax [phone] [address] [place], [place]  [address] [email or web address]  [facility] [phone] Fax [phone] [address] Suite [place], [place] [address] [email or web address]  [facility] [phone] [address] [place], [place] [address] [email or web address]  [facility] [phone]  [address] [place], [place] [address] [email or web address]  [facility] [phone] or [phone] Fax number [phone] [address] [place], [place]  [address] [email or web address]  [facility] [phone] Fax [phone] [address] [place], [place]  [address]  [facility]  [phone] Fax [phone] [address] [place], [place] [address] [email or web address]

## 2012-05-28 NOTE — ED Provider Notes (Addendum)
7:31 AM Patient sleeping on AM rounds.  Per BH, IVC papers have been rescinded.  The patient (and her family) is working with SW for placement.  Gerhard Munch, MD 05/28/12 Jesusita Oka  SW advises D/C w home health F/U  Gerhard Munch, MD 05/28/12 [phone]

## 2012-05-28 NOTE — Care Management ED Note (Signed)
       [facility] MANAGEMENT ED NOTE 05/28/2012  Patient:  Glenda [facility], [facility]   Account Number:  [id number]  Date Initiated:  05/28/2012  Documentation initiated by:  [name] Assessment:   78 yr old female medicare/tricare In [facility] ED since 05/25/12 IVC'd rescinded on 05/27/12 pcp [facility] [phone]  pcp lists dementia in EPIC note for 05/22/12     Subjective/Objective Assessment Detail:   pt reports having 2 canes & a walker at home Pt reports daughter who is a nurse who "does not visit me much" Pt states "I do not feel that a daughter cares if she is trying to put her mother in a facility"  05/26/12 telepsych report states pt is currently capacitatrd to make decisions about her current medical [facility] Patient is currently psychiatrically stable patient does not appears to be a danger to self or others Discharge patient to family or adult facility if medically clear Will discontinue involuntary commitment Continue current psyhiatry medications     Action/Plan:   CM spoke with ED SW, [facility], about pt case info,[facility] about d/c plans home w/HHRN-SW, Glenda Leash, RN at [facility] about plan for home health RN & SW with D/c home and initiated [facility] by ED SW   Action/Plan Detail:   CM reviewed ED clinicals since 05/25/12 meeting outpatient status wbc 10.6, K 3.2 increased to 4.3 since admission Provided pt a list of [place] home [facility] agencies to make a choice   Anticipated DC Date:  05/28/2012     Status Recommendation to Physician:   Result of Recommendation:    Other ED Services  Consult Working Plan   In-house referral  Clinical Social Worker   DC Associate Professor  Other    Choice offered to / List presented to:  C-1 Patient     HH arranged  HH-1 RN  HH-6 SOCIAL WORKER      HH agency  [facility]    Status of service:  Completed, signed off  ED Comments:   ED Comments Detail:  05/28/12 1150 [facility] ED CM spoke with pt about her  ED Visit since 05/25/12 and her preference for disposition plan.  Pt states she prefers to d/c home with her 2 dogs and not to d/c to a "facility" Pt reports seeing pcp recently and not discussing with anyone about going to a facility.  Pt able to tell CM her name, she has been at [facility] since Friday, She is in [facility] but moved from [facility].  Pt agreed to d/c home w/home [facility] services She can not recall agency used after her knee surgery See above notes--Pending calls from [facility] of [facility] to see if pt used services before and PCP [facility] 1350 Return call from [facility] Pt is not active with [facility] 1358 confirmed pt has been previously seen by [facility] 1402 Pt updated and chooses [facility] to assist her with home services Cm provided referral to [facility], home [facility] coordinator for [facility] for services Pt states she has keys to her home in her purse in locker in [place]  States she brought her purse with her to [facility] ED

## 2012-05-28 NOTE — ED Provider Notes (Signed)
Patient didn't want to go home. Telepsych reconsulted. Patient now has no capacity. Social work will get her placed in an assisted living.   Richardean Canal, MD 05/28/12 [phone]

## 2012-05-28 NOTE — Progress Notes (Signed)
CSW met with pt and rn tech at the nurses station. CSW attempted to redirect patient to her room, as report was being given at the nurses sation. Pt stated that she was going home with the nurse tech, and pointed to the nurse tech that had been her sitter. CSW explained to patient that she was not cleared by the doctor at this time and that patient will need to stay here in the hosptial. At that time, pt was called for telepsych. Pt agreed to return to room and complete telepsych consult. Pt stated, "he better not expect to come back and stay with me being on drugs and stuff and expect me to take care of him."   .[name], [name]  [phone] .05/28/2012 7:10pm

## 2012-05-28 NOTE — Telephone Encounter (Signed)
Emergent call: Daughter, Adela Lank, [phone].  Called to request placement at nursing home.  History of psychiatric problems and dementia.  Recently  Started on Zoloft and Aricept.  Had Mayla committed to [facility] over weekend 05/26/12-05/28/12 for erratic/reckless behavior, moodiness, driving without a drivers license, & difficulty walking.  Currently at ED now.  Teleconference psychiatric assessment done; Alishea found to be competent.  Social worker says she has to come take her home.  Daughter asking about possible drug interactions;  feels Kennede is not safe at home (and does not want her at home.) Daughter moving in 1 week.  Would like her sent to a nursing facility.  Please call back ASAP.

## 2012-05-28 NOTE — Telephone Encounter (Signed)
Called and discussed with SW who will ask for another psych eval

## 2012-05-28 NOTE — Clinical Social Work Note (Signed)
CSW attempted to contact pt daughter to follow up with d/c plans.  CSW left message with call back information. Vickii Penna, LCSWA [phone]  Clinical Social Work

## 2012-05-28 NOTE — Clinical Social Work Note (Signed)
CSW spoke with pt daughter, Larena Sox re: d/c plans.  Pt has been deemed to have capacity and pt wishes to return home.  Pt daughter is stating that "she has no where to go" because daughter cleaned out pt home (without pt permission) while pt was in ED.  CSW stated to dtr that pt will be d/c today and that appropriate arrangements would need to be made by pt and daughter (since daughter will not allow pt to return to her home).  Pt daughter stated that w/e CSW stated that pt daughter would need to get another IVC and an HCPOA.  This CSW stated to daughter that HCPOA does not override the pt right to choose.  CSW also advised Larena Sox that MD rescinded IVC paperwork and that pt endorsed no criteria for IVC.    Pt daughter concerned about pt "risky" behavior (i.e. Drinking with cancelled license and with an open container of ETOH in the car).  CSW validated her concerns and agreed this behavior seemed risky, but did not necessarily deem pt without capacity.  CSW reiterated the point that pt has a right to choose her behaviors and driving under those conditions was a Patent examiner issue and not a reason for an ED stay.  Daughter has been made aware of disposition for pt to return home and d/c today.  Pt daughter stated that she will need to call me back.   CSW will continue to follow.  Vickii Penna, LCSWA [phone]  Clinical Social Work

## 2012-05-28 NOTE — Progress Notes (Signed)
CARE MANAGEMENT ED NOTE 05/28/2012  Patient:  Glenda [facility], Glenda [facility]   Account Number:  [id number]  Date Initiated:  05/28/2012  Documentation initiated by:  [facility] Assessment:   78 yr old female medicare/tricare In [facility] ED since 05/25/12 IVC'd rescinded on 05/27/12 pcp [facility] [phone]  pcp lists dementia in EPIC note for 05/22/12     Subjective/Objective Assessment Detail:   pt reports having 2 canes & a walker at home Pt reports daughter who is a nurse who "does not visit me much" Pt states "I do not feel that a daughter cares if she is trying to put her mother in a facility"  05/26/12 telepsych report states pt is currently capacitatrd to make decisions about her current medical care Patient is currently psychiatrically stable patient does not appears to be a danger to self or others Discharge patient to family or adult facility if medically clear Will discontinue involuntary commitment Continue current psyhiatry medications     Action/Plan:   CM spoke with ED SW, [facility], about pt case info,[facility]P-[name] about d/c plans home w/HHRN-SW, Glenda Leash, RN at [facility] office about plan for home health RN & SW with D/c home and initiated [facility] by ED SW   Action/Plan Detail:   CM reviewed ED clinicals since 05/25/12 meeting outpatient status wbc 10.6, K 3.2 increased to 4.3 since admission Provided pt a list of [place] home [facility] agencies to make a choice   Anticipated DC Date:  05/28/2012     Status Recommendation to Physician:   Result of Recommendation:    Other ED Services  Consult Working Plan   In-house referral  Clinical Social Worker   DC Associate Professor  Other    Choice offered to / List presented to:  C-1 Patient     HH arranged  HH-1 RN  HH-6 SOCIAL WORKER      HH agency  [facility]    Status of service:  Completed, signed off  ED Comments:   ED Comments Detail:  05/28/12 1150 [facility] ED CM spoke with pt about her ED Visit  since 05/25/12 and her preference for disposition plan.  Pt states she prefers to d/c home with her 2 dogs and not to d/c to a "facility" Pt reports seeing pcp recently and not discussing with anyone about going to a facility.  Pt able to tell CM her name, she has been at [facility] since Friday, She is in [place] but moved from [facility].  Pt agreed to d/c home w/home [facility] services She can not recall agency used after her knee surgery See above notes--Pending calls from [facility] to see if pt used services before and PCP office 1350 Return call from [facility] Pt is not active with [facility] 1358 confirmed pt has been previously seen by [facility] 1402 Pt updated and chooses [facility] to assist her with home services Cm provided referral to [facility], home [facility] coordinator for [facility] for services Pt states she has keys to her home in her purse in locker in [facility]  States she brought her purse [facility] to [facility]

## 2012-05-28 NOTE — Progress Notes (Signed)
CSW spoke with psychiatrist from telepscyh, specialist on call. Per discussion with psychiatrist, pt does not have capacity at this time. CSW will initiate alf search and inform pt daughter. CSW will discuss initiate alf search and patient returning home to complete process if appropriate.   [name], LCSWA  [phone] 05/28/2012 1927pm

## 2012-05-28 NOTE — ED Notes (Signed)
When pt got her dinner tray she refused to eat it and staff encouraged her to eat, but pt refused so staff encouraged pt to keep tray for later and that night shift sitter could help her. Pt stated that "I dont want anyone else sitting with me except you," Staff explained to pt that night shift would be coming soon and that she would have another sitter. Pt said that she was coming home with staff, social worker redirected pt to room for telepsych.

## 2012-05-28 NOTE — Clinical Social Work Note (Signed)
CSW spoke to ED RNCM, Selena Batten in re: pt disposition.  CSW is in agreement with d/c plans home with home health and social work in place.  Per chart review, RNCM confirmed that pt has access to keys to her home.  CSW will continue to assist where necessary. Vickii Penna, LCSWA [phone]  Clinical Social Work

## 2012-05-29 LAB — GLUCOSE, CAPILLARY: Glucose-Capillary: 120 mg/dL — ABNORMAL HIGH (ref 70–99)

## 2012-05-29 NOTE — Clinical Social Work Note (Signed)
CSW spoke to [place] (pt daughter).  CSW advised Larena Sox of the same things CSW explained yesterday... Pt can be placed at ALF, but Medicare does not pay.  Pt can be placed at SNF, but again pt would need to pay out of pocket because Medicare requires a 3 day qualifying stay in order to pay for SNF.  Pt and daughter not willing to pay out of pocket.  Daughter agreeable to d/c patient into daughter's care with home health services in place and community SW services.  Explained to daughter that the placement process could be initiated  by either the community SW or the pt PCP.    Daughter will be here around 1:30 to pick pt up.  EDP and RN made aware.  Vickii Penna, LCSWA [phone]  Clinical Social Work

## 2012-05-29 NOTE — Progress Notes (Signed)
CSW received call from pt daughter regarding pt discharge. Pt daughter voiced concerns that she did not receive any discharge paperwork. Pt daughter stated that there was no information given regarding the home health resources and services. CSW explained to pt daughter that pt son was given information, as well as the patient per discussion with ED RN CM. CSW offered to give pt daughter contact information to [name] [phone]) and information regarding private duty services however pt daughter declined.  Pt daughter stated she would be coming to the hospital. This CSW informed pt daughter that this CSW will not be out of the officer tomorrow however offered a contact number for a CSW colleague to provide pt daughter with information regarding resources. Pt daughter declined the contact number in order to receive referral process. Pt daughter stated, "I'll be coming up to the hospital tomorrow to talk a few people, I'm sure I will be able to find the social work office at that time." Pt daughter stated she had concerns regarding pt discharge and signing pt self out after pt was found to not have capacity to make her own decisions. CSW offered for patient to speak with pt RN to discuss discharge as pt was discharged prior to this CSW shift. Pt daughter declined. Pt daughter asked if patient medications have been changed, CSW stated that this CSW is not aware of any medication changes and spoke with pt PCP yesterday regarding medications and it did not appear that medications were changed. CSW once again attempted to direct patient daughter to nurse that would be able to answer questions regarding medications more thoroughly. Pt daughter declined to speak with pt RN.  Pt thanked csw for taking the time to speak with pt daughter.    [name], LCSWA  [phone] .05/29/2012 1823pm

## 2012-05-29 NOTE — Progress Notes (Signed)
[facility] ED CM left a copy of [place] home health agencies and private duty nursing service in an envelope with triage and ED Secretaries in case dtr arrives to ED

## 2012-05-29 NOTE — Progress Notes (Signed)
[facility] ED SW consulted CM pt's daughter picked pt up today for d/c home.  Daughter requesting home health agency list. This list was provided to the patient and son along with private duty nursing services on 05/28/12.  CM offered to provide dtr with [name] contact information, fax, email, mail and/or have a copy at [facility] ED for her Daughter refused all offers.  Cm called [name] [phone] to leave a voice message requesting the on call RN nurse place a call to pt and/or her daughter as listed on the face sheet provided for the initial referral.  At [employer] [facility] ED CM finished a conversation with Clydie Braun the on call RN for [name] who confirms the pt is active with [name] and should  Be receiving a call on 05/30/12 from her assigned RN. CM inquired if a call could be placed to dtr on this evening but on call RN states she does not have the complete [name] file and prefers for assigned Rn to call pt in am Confirmed daughter's name and contact number with Clydie Braun.

## 2012-06-02 ENCOUNTER — Inpatient Hospital Stay (HOSPITAL_COMMUNITY)
Admission: EM | Admit: 2012-06-02 | Discharge: 2012-06-03 | DRG: 313 | Disposition: A | Discharge status: Home / Self Care | Payer: Medicare Other | Attending: Internal Medicine | Admitting: Internal Medicine

## 2012-06-02 ENCOUNTER — Encounter (HOSPITAL_COMMUNITY): Payer: Self-pay | Admitting: Emergency Medicine

## 2012-06-02 ENCOUNTER — Other Ambulatory Visit: Payer: Self-pay | Admitting: Internal Medicine

## 2012-06-02 ENCOUNTER — Emergency Department (HOSPITAL_COMMUNITY): Payer: Medicare Other

## 2012-06-02 DIAGNOSIS — F329 Major depressive disorder, single episode, unspecified: Secondary | ICD-10-CM | POA: Diagnosis present

## 2012-06-02 DIAGNOSIS — F0390 Unspecified dementia without behavioral disturbance: Secondary | ICD-10-CM | POA: Diagnosis present

## 2012-06-02 DIAGNOSIS — R079 Chest pain, unspecified: Principal | ICD-10-CM | POA: Diagnosis present

## 2012-06-02 DIAGNOSIS — E785 Hyperlipidemia, unspecified: Secondary | ICD-10-CM | POA: Diagnosis present

## 2012-06-02 DIAGNOSIS — F3289 Other specified depressive episodes: Secondary | ICD-10-CM | POA: Diagnosis present

## 2012-06-02 DIAGNOSIS — E119 Type 2 diabetes mellitus without complications: Secondary | ICD-10-CM | POA: Diagnosis present

## 2012-06-02 DIAGNOSIS — R32 Unspecified urinary incontinence: Secondary | ICD-10-CM | POA: Diagnosis present

## 2012-06-02 DIAGNOSIS — K219 Gastro-esophageal reflux disease without esophagitis: Secondary | ICD-10-CM | POA: Diagnosis present

## 2012-06-02 DIAGNOSIS — I2510 Atherosclerotic heart disease of native coronary artery without angina pectoris: Secondary | ICD-10-CM | POA: Diagnosis present

## 2012-06-02 DIAGNOSIS — I10 Essential (primary) hypertension: Secondary | ICD-10-CM | POA: Diagnosis present

## 2012-06-02 LAB — RAPID URINE DRUG SCREEN, HOSP PERFORMED
Amphetamines: NOT DETECTED
Barbiturates: NOT DETECTED
Benzodiazepines: NOT DETECTED
Cocaine: NOT DETECTED
Opiates: NOT DETECTED
Tetrahydrocannabinol: NOT DETECTED

## 2012-06-02 LAB — CBC
HCT: 39.5 % (ref 36.0–46.0)
MCV: 84 fL (ref 78.0–100.0)
RBC: 4.7 MIL/uL (ref 3.87–5.11)
RDW: 13.7 % (ref 11.5–15.5)
WBC: 10.3 10*3/uL (ref 4.0–10.5)

## 2012-06-02 LAB — MAGNESIUM: Magnesium: 2.2 mg/dL (ref 1.5–2.5)

## 2012-06-02 LAB — ETHANOL: Alcohol, Ethyl (B): <11 mg/dL (ref 0–11)

## 2012-06-02 LAB — BASIC METABOLIC PANEL
BUN: 17 mg/dL (ref 6–23)
CO2: 26 mEq/L (ref 19–32)
Chloride: 99 mEq/L (ref 96–112)
Creatinine, Ser: 0.91 mg/dL (ref 0.50–1.10)
Glucose, Bld: 139 mg/dL — ABNORMAL HIGH (ref 70–99)

## 2012-06-02 LAB — HEMOGLOBIN A1C: Hgb A1c MFr Bld: 6.1 % — ABNORMAL HIGH (ref <5.7)

## 2012-06-02 LAB — TROPONIN I: Troponin I: <0.3 ng/mL

## 2012-06-02 LAB — APTT: aPTT: 27 seconds (ref 24–37)

## 2012-06-02 LAB — GLUCOSE, CAPILLARY: Glucose-Capillary: 131 mg/dL — ABNORMAL HIGH (ref 70–99)

## 2012-06-02 LAB — PROTIME-INR: INR: 0.92 (ref 0.00–1.49)

## 2012-06-02 LAB — PHOSPHORUS: Phosphorus: 2.5 mg/dL (ref 2.3–4.6)

## 2012-06-02 LAB — TSH: TSH: 1.067 u[IU]/mL (ref 0.350–4.500)

## 2012-06-02 MED ORDER — LISINOPRIL 20 MG PO TABS
20.0000 mg | ORAL_TABLET | Freq: Every day | ORAL | Status: DC
  Administered 2012-06-03: 20 mg via ORAL
  Filled 2012-06-02: qty 1

## 2012-06-02 MED ORDER — SERTRALINE HCL 25 MG PO TABS
25.0000 mg | ORAL_TABLET | Freq: Every day | ORAL | Status: DC
  Administered 2012-06-03: 25 mg via ORAL
  Filled 2012-06-02 (×2): qty 1

## 2012-06-02 MED ORDER — ASPIRIN 81 MG PO CHEW
324.0000 mg | CHEWABLE_TABLET | Freq: Once | ORAL | Status: AC
  Administered 2012-06-02: 324 mg via ORAL
  Filled 2012-06-02: qty 4

## 2012-06-02 MED ORDER — SODIUM CHLORIDE 0.9 % IJ SOLN
3.0000 mL | Freq: Two times a day (BID) | INTRAMUSCULAR | Status: DC
  Administered 2012-06-02 (×2): 3 mL via INTRAVENOUS

## 2012-06-02 MED ORDER — ACETAMINOPHEN 500 MG PO TABS: 1000.0000 mg | ORAL_TABLET | Freq: Four times a day (QID) | ORAL | Status: DC | PRN

## 2012-06-02 MED ORDER — DARIFENACIN HYDROBROMIDE ER 7.5 MG PO TB24
7.5000 mg | ORAL_TABLET | Freq: Every day | ORAL | Status: DC
  Administered 2012-06-03: 7.5 mg via ORAL
  Filled 2012-06-02: qty 1

## 2012-06-02 MED ORDER — LISINOPRIL-HYDROCHLOROTHIAZIDE 20-25 MG PO TABS: 1.0000 | ORAL_TABLET | Freq: Every day | ORAL | Status: DC

## 2012-06-02 MED ORDER — METFORMIN HCL ER 500 MG PO TB24
500.0000 mg | ORAL_TABLET | Freq: Every day | ORAL | Status: DC
  Administered 2012-06-03: 500 mg via ORAL
  Filled 2012-06-02 (×2): qty 1

## 2012-06-02 MED ORDER — TRAZODONE HCL 150 MG PO TABS
150.0000 mg | ORAL_TABLET | Freq: Every day | ORAL | Status: DC
  Administered 2012-06-02: 150 mg via ORAL
  Filled 2012-06-02 (×2): qty 1

## 2012-06-02 MED ORDER — ASPIRIN 81 MG PO CHEW
81.0000 mg | CHEWABLE_TABLET | Freq: Every day | ORAL | Status: DC
  Administered 2012-06-03: 81 mg via ORAL
  Filled 2012-06-02: qty 1

## 2012-06-02 MED ORDER — INSULIN ASPART 100 UNIT/ML ~~LOC~~ SOLN
0.0000 [IU] | Freq: Three times a day (TID) | SUBCUTANEOUS | Status: DC
  Administered 2012-06-03: 3 [IU] via SUBCUTANEOUS

## 2012-06-02 MED ORDER — ASPIRIN 81 MG PO TABS: 81.0000 mg | ORAL_TABLET | Freq: Every day | ORAL | Status: DC

## 2012-06-02 MED ORDER — FELODIPINE ER 10 MG PO TB24
10.0000 mg | ORAL_TABLET | Freq: Every day | ORAL | Status: DC
  Administered 2012-06-03: 10 mg via ORAL
  Filled 2012-06-02: qty 1

## 2012-06-02 MED ORDER — HYDROCHLOROTHIAZIDE 25 MG PO TABS
25.0000 mg | ORAL_TABLET | Freq: Every day | ORAL | Status: DC
  Administered 2012-06-03: 25 mg via ORAL
  Filled 2012-06-02: qty 1

## 2012-06-02 MED ORDER — INSULIN ASPART 100 UNIT/ML ~~LOC~~ SOLN
0.0000 [IU] | SUBCUTANEOUS | Status: DC
  Administered 2012-06-02: 1 [IU] via SUBCUTANEOUS

## 2012-06-02 MED ORDER — DULOXETINE HCL 60 MG PO CPEP
60.0000 mg | ORAL_CAPSULE | Freq: Every day | ORAL | Status: DC
  Administered 2012-06-03: 60 mg via ORAL
  Filled 2012-06-02: qty 1

## 2012-06-02 MED ORDER — TRAMADOL HCL 50 MG PO TABS
50.0000 mg | ORAL_TABLET | Freq: Every day | ORAL | Status: DC
  Administered 2012-06-02: 50 mg via ORAL
  Filled 2012-06-02 (×2): qty 1

## 2012-06-02 MED ORDER — HYDROCODONE-ACETAMINOPHEN 5-325 MG PO TABS: 1.0000 | ORAL_TABLET | ORAL | Status: DC | PRN

## 2012-06-02 MED ORDER — ENOXAPARIN SODIUM 30 MG/0.3ML ~~LOC~~ SOLN
30.0000 mg | SUBCUTANEOUS | Status: DC
  Administered 2012-06-02: 30 mg via SUBCUTANEOUS
  Filled 2012-06-02 (×2): qty 0.3

## 2012-06-02 MED ORDER — MORPHINE SULFATE 2 MG/ML IJ SOLN: 1.0000 mg | INTRAMUSCULAR | Status: DC | PRN

## 2012-06-02 MED ORDER — MELOXICAM 15 MG PO TABS
15.0000 mg | ORAL_TABLET | Freq: Every day | ORAL | Status: DC
  Administered 2012-06-03: 15 mg via ORAL
  Filled 2012-06-02: qty 1

## 2012-06-02 MED ORDER — SODIUM CHLORIDE 0.9 % IV SOLN
1000.0000 mL | INTRAVENOUS | Status: DC
  Administered 2012-06-02: 1000 mL via INTRAVENOUS

## 2012-06-02 MED ORDER — METOPROLOL SUCCINATE ER 100 MG PO TB24
100.0000 mg | ORAL_TABLET | Freq: Every day | ORAL | Status: DC
  Filled 2012-06-02: qty 1

## 2012-06-02 MED ORDER — ONDANSETRON HCL 4 MG/2ML IJ SOLN: 4.0000 mg | Freq: Four times a day (QID) | INTRAMUSCULAR | Status: DC | PRN

## 2012-06-02 MED ORDER — SIMVASTATIN 20 MG PO TABS
20.0000 mg | ORAL_TABLET | Freq: Every day | ORAL | Status: DC
  Filled 2012-06-02: qty 1

## 2012-06-02 MED ORDER — ONDANSETRON HCL 4 MG PO TABS: 4.0000 mg | ORAL_TABLET | Freq: Four times a day (QID) | ORAL | Status: DC | PRN

## 2012-06-02 NOTE — ED Notes (Signed)
md at bedside  Pt alert and oriented x4. Respirations even and unlabored, bilateral symmetrical rise and fall of chest. Skin warm and dry. In no acute distress. Denies needs.   

## 2012-06-02 NOTE — H&P (Signed)
[facility] History and Physical  Glenda Underwood QMV:[id number] DOB: Mar 15, 1933 DOA: 06/02/2012  Referring physician: ER physician PCP: Rogelia Boga, MD   Chief Complaint: chest pain  HPI:  78 year old female with history of HTN, dyslipidemia, diabetes, depression who presented to ED for acute onset left sided chest pain that started at rest 1 day prior to this admission.  Patient reported pain to last few minutes and is resolved at this time but because she felt weak she decided to come to ED for evaluation. No reports of palpitations, shortness of breath, no cough, fever or chills. No abdominal pain, no nausea or vomiting. No reports of blood in stool or urine.  Assessment and Plan:  Principal Problem:  *Chest pain  obviously concerning for an acute coronary syndrome considering her previous medical history and risk factors however less likely ACS, rather related to GI/ refulx  cycle cardiac enzymes  get 2 D ECHO, A1c and TSH, lipid panel  get UDS and alcohol level   IVfluids and pain control  Active Problems:  DIABETES MELLITUS, TYPE II  1st admission A1c shows good control  Will get this admission A1c  Continue metformin  HYPERLIPIDEMIA  Continue simvastatin  DEPRESSION  Continue cymbalta, zoloft Dementia  Continue aricept  HYPERTENSION  Continue current home meds: metoprolol, lisinopril, Hctz and felodipine  CORONARY ARTERY DISEASE  Continue aspirin  Cycle cardiac enzymes  Get 2 D ECHO  GERD  Continue protonix  Urinary incontinence  Continue vesicare  Code Status: Full Family Communication: Pt at bedside Disposition Plan: Admit for further evaluation  Manson Passey, MD  [facility] Pager [phone]  If 7PM-7AM, please contact night-coverage [email or web address] Password [email or web address] 06/02/2012, 2:22 PM   Review of Systems:  Constitutional: Negative for fever, chills and malaise/fatigue. Negative for diaphoresis.  HENT: Negative for hearing loss,  ear pain, nosebleeds, congestion, sore throat, neck pain, tinnitus and ear discharge.   Eyes: Negative for blurred vision, double vision, photophobia, pain, discharge and redness.  Respiratory: Negative for cough, hemoptysis, sputum production, shortness of breath, wheezing and stridor.   Cardiovascular: per hpi Gastrointestinal: Negative for nausea, vomiting and abdominal pain. Negative for heartburn, constipation, blood in stool and melena.  Genitourinary: Negative for dysuria, urgency, frequency, hematuria and flank pain.  Musculoskeletal: Negative for myalgias, back pain, joint pain and falls.  Skin: Negative for itching and rash.  Neurological: Negative for dizziness and weakness. Negative for tingling, tremors, sensory change, speech change, focal weakness, loss of consciousness and headaches.  Endo/Heme/Allergies: Negative for environmental allergies and polydipsia. Does not bruise/bleed easily.  Psychiatric/Behavioral: Negative for suicidal ideas. The patient is not nervous/anxious.      Past Medical History  Diagnosis Date  . CORONARY ARTERY DISEASE 07/20/2010  . DEPRESSION 07/20/2010  . DIABETES MELLITUS, TYPE II 07/20/2010  . GERD 07/20/2010  . HYPERLIPIDEMIA 07/20/2010  . HYPERTENSION 07/20/2010  . LOW BACK PAIN 07/20/2010  . OSTEOARTHRITIS 07/20/2010  . URINARY INCONTINENCE 07/20/2010  . TIA (transient ischemic attack)    Past Surgical History  Procedure Date  . Abdominal hysterectomy   . Vitrectomy   . Total knee arthroplasty   . Coronary stent placement    Social History:  reports that she has been smoking.  She has never used smokeless tobacco. She reports that she drinks alcohol. She reports that she does not use illicit drugs.  Allergies  Allergen Reactions  . Penicillins Anaphylaxis    Family History: htn in mother  Prior to Admission medications   Medication  Sig Start Date End Date Taking? Authorizing Provider  acetaminophen (TYLENOL) 500 MG tablet Take 1,000 mg  by mouth every 6 (six) hours as needed. For pain   Yes Historical Provider, MD  aspirin 81 MG tablet Take 81 mg by mouth daily.     Yes Historical Provider, MD  donepezil (ARICEPT) 5 MG tablet Take 5 mg by mouth at bedtime as needed. DEMENTIA 05/01/12  Yes Gordy Savers, MD  DULoxetine (CYMBALTA) 60 MG capsule Take 60 mg by mouth daily. 03/13/12  Yes Gordy Savers, MD  felodipine (PLENDIL) 10 MG 24 hr tablet Take 10 mg by mouth daily. 03/13/12  Yes Gordy Savers, MD  lisinopril-hydrochlorothiazide (PRINZIDE,ZESTORETIC) 20-25 MG per tablet Take 1 tablet by mouth daily. 03/13/12  Yes Gordy Savers, MD  meloxicam (MOBIC) 15 MG tablet Take 15 mg by mouth daily. 03/13/12  Yes Gordy Savers, MD  metFORMIN (GLUCOPHAGE-XR) 500 MG 24 hr tablet Take 500 mg by mouth daily with breakfast. 03/13/12  Yes Gordy Savers, MD  metoprolol succinate (TOPROL-XL) 100 MG 24 hr tablet Take 100 mg by mouth daily. 03/13/12  Yes Gordy Savers, MD  sertraline (ZOLOFT) 25 MG tablet Take 25 mg by mouth daily. 05/01/12  Yes Gordy Savers, MD  simvastatin (ZOCOR) 20 MG tablet Take 20 mg by mouth daily. 03/13/12  Yes Gordy Savers, MD  solifenacin (VESICARE) 10 MG tablet Take 10 mg by mouth daily. 03/13/12 03/13/13 Yes Gordy Savers, MD  traMADol (ULTRAM) 50 MG tablet Take 50 mg by mouth at bedtime. 03/13/12  Yes Gordy Savers, MD  traZODone (DESYREL) 50 MG tablet Take 150 mg by mouth at bedtime. 03/13/12  Yes Gordy Savers, MD   Physical Exam: Filed Vitals:   06/02/12 1200 06/02/12 1204 06/02/12 1235  BP: 111/90    Pulse: 63  51  Temp: 97.7 F (36.5 C)    TempSrc: Oral    Resp: 19  12  Weight:  65.46 kg (144 lb 5 oz)   SpO2: 98%  98%    Physical Exam  Constitutional: Appears well-developed and well-nourished. No distress.  HENT: Normocephalic. Oropharynx is clear and moist.  Eyes: Conjunctivae and EOM are normal. PERRLA, no scleral icterus.  Neck: Normal  ROM. Neck supple. No JVD. No tracheal deviation. No thyromegaly.  CVS: RRR, S1/S2 +, no murmurs, no gallops, no carotid bruit.  Pulmonary: Effort and breath sounds normal, no stridor, rhonchi, wheezes, rales.  Abdominal: Soft. BS +,  no distension, tenderness, rebound or guarding.  Musculoskeletal: Normal range of motion. No edema and no tenderness.  Lymphadenopathy: No lymphadenopathy noted, cervical, inguinal. Neuro: Alert. Normal reflexes, muscle tone coordination. No cranial nerve deficit. Skin: Skin is warm and dry. No rash noted. Not diaphoretic. No erythema. No pallor.   Labs on Admission:  Basic Metabolic Panel:  Lab 06/02/12 6578  NA 136  K 3.8  CL 99  CO2 26  GLUCOSE 139*  BUN 17  CREATININE 0.91  CALCIUM 9.3   CBC:  Lab 06/02/12 1225  WBC 10.3  HGB 13.6  HCT 39.5  MCV 84.0  PLT 287   CBG:  Lab 05/29/12 1232 05/29/12 0827 05/28/12 2050 05/28/12 1746 05/28/12 1154  GLUCAP 120* 114* 119* 131* 102*    Radiological Exams on Admission: Dg Chest 2 View 06/02/2012    IMPRESSION: Borderline heart size.  No acute cardiopulmonary disease.      EKG: Normal sinus rhythm, no ST/T wave changes  Time  spent:75 minutes

## 2012-06-02 NOTE — ED Notes (Signed)
I stat troponin already been drawn and completed

## 2012-06-02 NOTE — ED Provider Notes (Signed)
History    CSN: [id number] Arrival date & time 06/02/12  1154 First MD Initiated Contact with Patient 06/02/12 1225    Chief Complaint  Patient presents with  . Chest Pain   HPI Pt had taken a nap today and when she woke up she felt poorly.  She was holding her chest and said she did not feel well.  Family checked her heart rate and it was in the 50s.  This is unusual for her.  SHe remembers feeling "rotten" for a few minutes.  It was painful in her chest but not real bad.  She does know it was not normal.  No shortness of breath, nauseaThat has resolved but she does still feel a little weak.  She does have history of CAD with a stent in 2005.  She has been symptom free with her heart since then.  Past Medical History  Diagnosis Date  . CORONARY ARTERY DISEASE 07/20/2010  . DEPRESSION 07/20/2010  . DIABETES MELLITUS, TYPE II 07/20/2010  . GERD 07/20/2010  . HYPERLIPIDEMIA 07/20/2010  . HYPERTENSION 07/20/2010  . LOW BACK PAIN 07/20/2010  . OSTEOARTHRITIS 07/20/2010  . URINARY INCONTINENCE 07/20/2010  . TIA (transient ischemic attack)     Past Surgical History  Procedure Date  . Abdominal hysterectomy   . Vitrectomy   . Total knee arthroplasty   . Coronary stent placement     No family history on file.  History  Substance Use Topics  . Smoking status: Current Every Day Smoker  . Smokeless tobacco: Never Used  . Alcohol Use: Yes    OB History    Grav Para Term Preterm Abortions TAB SAB Ect Mult Living                  Review of Systems  All other systems reviewed and are negative.    Allergies  Penicillins  Home Medications   Current Outpatient Rx  Name  Route  Sig  Dispense  Refill  . ACETAMINOPHEN 500 MG PO TABS   Oral   Take 1,000 mg by mouth every 6 (six) hours as needed. For pain         . ASPIRIN 81 MG PO TABS   Oral   Take 81 mg by mouth daily.           . DONEPEZIL HCL 5 MG PO TABS   Oral   Take 5 mg by mouth at bedtime as needed. DEMENTIA         . DULOXETINE HCL 60 MG PO CPEP   Oral   Take 60 mg by mouth daily.         Marland Kitchen FELODIPINE ER 10 MG PO TB24   Oral   Take 10 mg by mouth daily.         Marland Kitchen LISINOPRIL-HYDROCHLOROTHIAZIDE 20-25 MG PO TABS   Oral   Take 1 tablet by mouth daily.         . MELOXICAM 15 MG PO TABS   Oral   Take 15 mg by mouth daily.         Marland Kitchen METFORMIN HCL ER 500 MG PO TB24   Oral   Take 500 mg by mouth daily with breakfast.         . METOPROLOL SUCCINATE ER 100 MG PO TB24   Oral   Take 100 mg by mouth daily.         . SERTRALINE HCL 25 MG PO TABS   Oral  Take 25 mg by mouth daily.         Marland Kitchen SIMVASTATIN 20 MG PO TABS   Oral   Take 20 mg by mouth daily.         Marland Kitchen SOLIFENACIN SUCCINATE 10 MG PO TABS   Oral   Take 10 mg by mouth daily.         . TRAMADOL HCL 50 MG PO TABS   Oral   Take 50 mg by mouth at bedtime.         . TRAZODONE HCL 50 MG PO TABS   Oral   Take 150 mg by mouth at bedtime.           BP 111/90  Pulse 51  Temp 97.7 F (36.5 C) (Oral)  Resp 12  Wt 144 lb 5 oz (65.46 kg)  SpO2 98%  Physical Exam  Nursing note and vitals reviewed. Constitutional: She appears well-developed and well-nourished. No distress.  HENT:  Head: Normocephalic and atraumatic.  Right Ear: External ear normal.  Left Ear: External ear normal.  Eyes: Conjunctivae normal are normal. Right eye exhibits no discharge. Left eye exhibits no discharge. No scleral icterus.  Neck: Neck supple. No tracheal deviation present.  Cardiovascular: Normal rate, regular rhythm and intact distal pulses.   Pulmonary/Chest: Effort normal and breath sounds normal. No stridor. No respiratory distress. She has no wheezes. She has no rales.  Abdominal: Soft. Bowel sounds are normal. She exhibits no distension. There is no tenderness. There is no rebound and no guarding.  Musculoskeletal: She exhibits no edema and no tenderness.  Neurological: She is alert. She has normal strength. No sensory  deficit. Cranial nerve deficit:  no gross defecits noted. She exhibits normal muscle tone. She displays no seizure activity. Coordination normal.  Skin: Skin is warm and dry. No rash noted.  Psychiatric: She has a normal mood and affect.    ED Course  Procedures (including critical care time) EKG Rate 63, left axis, no st elevation SINUS RHYTHM ~ normal P axis, V-rate 50- 99 NONSPECIFIC INTRAVENTRICULAR CONDUCTION DELAY ~ QRSd >172mS, not LBBB/RBBB ANTERIOR INFARCT, OLD ~ Q >41mS, abnormal ST-T, V2-V5 No old EKG for comparison Labs Reviewed  BASIC METABOLIC PANEL - Abnormal; Notable for the following:    Glucose, Bld 139 (*)     GFR calc non Af Amer 59 (*)     GFR calc Af Amer 69 (*)     All other components within normal limits  CBC  POCT I-STAT TROPONIN I  PROTIME-INR  APTT   Dg Chest 2 View  06/02/2012  *RADIOLOGY REPORT*  Clinical Data: Cough.  Chest pain.  Generalized weakness.  Current history of hypertension and diabetes.  CHEST - 2 VIEW  Comparison: None.  Findings: Cardiac silhouette upper normal in size.  Hilar and mediastinal contours unremarkable.  Lungs clear.  Bronchovascular markings normal.  Pulmonary vascularity normal.  No pneumothorax. No pleural effusions.  Degenerative changes throughout the thoracic spine.  IMPRESSION: Borderline heart size.  No acute cardiopulmonary disease.   Original Report Authenticated By: Hulan Saas, M.D.      1. Chest pain   2. DIABETES MELLITUS, TYPE II   3. Dementia   4. CORONARY ARTERY DISEASE       MDM  Pt has history of CAD.  Presents with chest pain.  No signs of acute ischemia at this time but considering her medical history, I recommended to the patient that we admit for observation, serial cardiac enzymes,  and possible stress testing.  Pt agrees.  Of note, daughter asked about a healthcare power of attorney.  We will check with social worker for their help.        Celene Kras, MD 06/02/12 [id number]

## 2012-06-02 NOTE — ED Notes (Signed)
Pt states she woke up about 15-31mins ago and was having central chest pain, dizziness, pt states she is weak. Her Heart rate at home was 50 and stated that is low for her.

## 2012-06-02 NOTE — ED Notes (Signed)
Pt alert and oriented x4. Respirations even and unlabored, bilateral symmetrical rise and fall of chest. Skin warm and dry. In no acute distress. Denies needs.   

## 2012-06-02 NOTE — ED Notes (Signed)
Report given to floor

## 2012-06-02 NOTE — ED Notes (Signed)
Pt to xray

## 2012-06-03 ENCOUNTER — Encounter (HOSPITAL_COMMUNITY): Payer: Self-pay | Admitting: Cardiology

## 2012-06-03 ENCOUNTER — Other Ambulatory Visit: Payer: Self-pay | Admitting: Internal Medicine

## 2012-06-03 DIAGNOSIS — E785 Hyperlipidemia, unspecified: Secondary | ICD-10-CM

## 2012-06-03 DIAGNOSIS — F329 Major depressive disorder, single episode, unspecified: Secondary | ICD-10-CM

## 2012-06-03 DIAGNOSIS — K219 Gastro-esophageal reflux disease without esophagitis: Secondary | ICD-10-CM

## 2012-06-03 DIAGNOSIS — I10 Essential (primary) hypertension: Secondary | ICD-10-CM

## 2012-06-03 LAB — GLUCOSE, CAPILLARY: Glucose-Capillary: 201 mg/dL — ABNORMAL HIGH (ref 70–99)

## 2012-06-03 LAB — BASIC METABOLIC PANEL
Chloride: 101 mEq/L (ref 96–112)
GFR calc Af Amer: 68 mL/min — ABNORMAL LOW (ref >90)
Potassium: 3.5 mEq/L (ref 3.5–5.1)

## 2012-06-03 LAB — CBC
HCT: 35.5 % — ABNORMAL LOW (ref 36.0–46.0)
Hemoglobin: 12 g/dL (ref 12.0–15.0)
RBC: 4.21 MIL/uL (ref 3.87–5.11)
WBC: 10.4 10*3/uL (ref 4.0–10.5)

## 2012-06-03 MED ORDER — ENOXAPARIN SODIUM 40 MG/0.4ML ~~LOC~~ SOLN
40.0000 mg | SUBCUTANEOUS | Status: DC
  Filled 2012-06-03: qty 0.4

## 2012-06-03 MED ORDER — METOPROLOL SUCCINATE ER 100 MG PO TB24: 100.0000 mg | ORAL_TABLET | Freq: Every day | ORAL | Status: DC

## 2012-06-03 NOTE — Consult Note (Signed)
CARDIOLOGY CONSULT NOTE  Patient ID: Glenda Underwood MRN: [id number] DOB/AGE: 1932-10-07 78 y.o.  Admit date: 06/02/2012 Primary Physician Rogelia Boga, MD Primary Cardiologist  None Chief Complaint  Chest pain  HPI:   The patient presents for evaluation of chest pain.  She has dementia and poor short term memory and she cannot give me much in the way of details.  I tried to call her daughter but could not get in contact.  She apparently has a history of CAD with stenting in 2008 but I have no details.  She has lived in [place] and in elsewhere in [place] before moving here.  She reports that she lives alone but I read from Rogelia Boga, MD that her daughter helps to care for her and has found this more difficult because of the patient's memory problems.    The patient describes a sharp pain radiating across her upper chest.  She does not describe associated symptoms.  The patient denies any new symptoms such as neck or arm discomfort. There has been no new shortness of breath, PND or orthopnea. There have been no reported palpitations, presyncope or syncope.  During this admission she has had IVCD on EKG without acute ST T wave changes but no old EKG for comparison.  Enzymes have been negative.   Past Medical History  Diagnosis Date  . CORONARY ARTERY DISEASE 07/20/2010  . DEPRESSION 07/20/2010  . DIABETES MELLITUS, TYPE II 07/20/2010  . GERD 07/20/2010  . HYPERLIPIDEMIA 07/20/2010  . HYPERTENSION 07/20/2010  . LOW BACK PAIN 07/20/2010  . OSTEOARTHRITIS 07/20/2010  . URINARY INCONTINENCE 07/20/2010  . TIA (transient ischemic attack)     Past Surgical History  Procedure Date  . Abdominal hysterectomy   . Vitrectomy   . Total knee arthroplasty   . Coronary stent placement     Allergies  Allergen Reactions  . Penicillins Anaphylaxis   Prescriptions prior to admission  Medication Sig Dispense Refill  . acetaminophen (TYLENOL) 500 MG tablet Take 1,000 mg by mouth every  6 (six) hours as needed. For pain      . aspirin 81 MG tablet Take 81 mg by mouth daily.        Marland Kitchen donepezil (ARICEPT) 5 MG tablet Take 5 mg by mouth at bedtime as needed. DEMENTIA      . DULoxetine (CYMBALTA) 60 MG capsule Take 60 mg by mouth daily.      . felodipine (PLENDIL) 10 MG 24 hr tablet Take 10 mg by mouth daily.      Marland Kitchen lisinopril-hydrochlorothiazide (PRINZIDE,ZESTORETIC) 20-25 MG per tablet Take 1 tablet by mouth daily.      . meloxicam (MOBIC) 15 MG tablet Take 15 mg by mouth daily.      . metFORMIN (GLUCOPHAGE-XR) 500 MG 24 hr tablet Take 500 mg by mouth daily with breakfast.      . metoprolol succinate (TOPROL-XL) 100 MG 24 hr tablet Take 100 mg by mouth daily.      . sertraline (ZOLOFT) 25 MG tablet Take 25 mg by mouth daily.      . simvastatin (ZOCOR) 20 MG tablet Take 20 mg by mouth daily.      . solifenacin (VESICARE) 10 MG tablet Take 10 mg by mouth daily.      . traMADol (ULTRAM) 50 MG tablet Take 50 mg by mouth at bedtime.      . traZODone (DESYREL) 50 MG tablet Take 150 mg by mouth at bedtime.  Family History  Problem Relation Age of Onset  . Hypertension Mother     History   Social History  . Marital Status: Widowed    Spouse Name: N/A    Number of Children: 5  . Years of Education: N/A   Occupational History  . Not on file.   Social History Main Topics  . Smoking status: Current Every Day Smoker    Types: Cigarettes  . Smokeless tobacco: Never Used     Comment: She says she will quit now.   . Alcohol Use: Yes  . Drug Use: No  . Sexually Active: Not Currently   Other Topics Concern  . Not on file   Social History Narrative   She lives alone.    She has two dogs.       ROS:  As stated in the HPI and negative for all other systems.  Physical Exam: Blood pressure 138/47, pulse 52, temperature 97.9 F (36.6 C), temperature source Oral, resp. rate 20, height 4\' 11"  (1.499 m), weight 143 lb 8 oz (65.091 kg), SpO2 100.00%.  GENERAL:  Well  appearing HEENT:  Pupils equal round and reactive, fundi not visualized, oral mucosa unremarkable, poor dentition NECK:  No jugular venous distention, waveform within normal limits, carotid upstroke brisk and symmetric, no bruits, no thyromegaly LYMPHATICS:  No cervical, inguinal adenopathy LUNGS:  Clear to auscultation bilaterally BACK:  No CVA tenderness CHEST:  Unremarkable HEART:  PMI not displaced or sustained,S1 and S2 within normal limits, no S3, no S4, no clicks, no rubs, soft outflow tract systolic murmur without diastolic murmurs ABD:  Flat, positive bowel sounds normal in frequency in pitch, no bruits, no rebound, no guarding, no midline pulsatile mass, no hepatomegaly, no splenomegaly EXT:  2 plus pulses throughout, no edema, no cyanosis no clubbing SKIN:  No rashes no nodules NEURO:  Cranial nerves II through XII grossly intact, motor grossly intact throughout PSYCH:  Cognitively intact, oriented to person place and time, poor memory   Labs: Lab Results  Component Value Date   BUN 19 06/03/2012   Lab Results  Component Value Date   CREATININE 0.92 06/03/2012   Lab Results  Component Value Date   NA 136 06/03/2012   K 3.5 06/03/2012   CL 101 06/03/2012   CO2 28 06/03/2012   Lab Results  Component Value Date   TROPONINI <0.30 06/03/2012   Lab Results  Component Value Date   WBC 10.4 06/03/2012   HGB 12.0 06/03/2012   HCT 35.5* 06/03/2012   MCV 84.3 06/03/2012   PLT 229 06/03/2012   No results found for this basename: CHOL,  HDL,  LDLCALC,  LDLDIRECT,  TRIG,  CHOLHDL   Lab Results  Component Value Date   ALT 14 05/25/2012   AST 23 05/25/2012   ALKPHOS 81 05/25/2012   BILITOT 0.4 05/25/2012    Radiology: CXR:  Borderline heart size. No acute cardiopulmonary disease.  EKG:  NSR, rate 73, IVCD.  06/03/2012  ASSESSMENT AND PLAN:   Chest pain:  No objective evidence of ischemia.  I think that she should have an ischemia work up given her previous history.  However,  this could be an out patient stress test.  I don't think that she would be able to walk on a treadmill so she will need a Lexiscan Myoview.  We will call her with these details  HTN:  Her BP is well controlled.  Continue current medications.    Diabetes:  Her A1C  is at target.  Continue current therapy.  Murmur:  I suspect non hemodynamically significant aortic sclerosis/stenosis.    SignedRollene Rotunda 06/03/2012, 3:20 PM

## 2012-06-03 NOTE — Discharge Summary (Signed)
Physician Discharge Summary  Glenda Underwood ZOX:[id number] DOB: 10/20/1932 DOA: 06/02/2012  PCP: Rogelia Boga, MD  Admit date: 06/02/2012 Discharge date: 06/03/2012  Recommendations for Outpatient Follow-up:   Follow-up Information    Follow up with Rogelia Boga, MD. In 1 week.   Contact information:   [address] [place] [address] [phone]       Follow up with Rollene Rotunda, MD. (they will call you with an appointment)    Contact information:   [address] [place] [address] [phone]         Discharge Diagnoses:  Principal Problem:  *Chest pain Active Problems:  DIABETES MELLITUS, TYPE II  HYPERLIPIDEMIA  DEPRESSION  HYPERTENSION  CORONARY ARTERY DISEASE  GERD  Dementia  Urinary incontinence   Discharge Condition:Stable Disposition: Home  Diet recommendation: Diabetic  Filed Weights   06/02/12 1204 06/02/12 1545  Weight: 65.46 kg (144 lb 5 oz) 65.091 kg (143 lb 8 oz)    History of present illness:  78 year old female with history of HTN, dyslipidemia, diabetes, depression who presented to ED for acute onset left sided chest pain that started at rest 1 day prior to this admission. Patient reported pain to last few minutes and is resolved at this time but because she felt weak she decided to come to ED for evaluation. No reports of palpitations, shortness of breath, no cough, fever or chills. No abdominal pain, no nausea or vomiting. No reports of blood in stool or urine.    Hospital Course:  Principal Problem:  *Chest pain /CORONARY ARTERY DISEASE Patient ruled out with negative cardiac markers. Patient was evaluated by cardiology. Patient will be called by [facility] cardiology for outpatient stress test. Patient had no further chest pain prior to discharge.  Active Problems:  DIABETES MELLITUS, TYPE II To continue her home medication   HYPERLIPIDEMIA Continue statin  medication   DEPRESSION Continue Cymbalta   HYPERTENSION Stable      Discharge Instructions  Discharge Orders    Future Appointments: Provider: Department: Dept Phone: Center:   06/12/2012 8:30 AM Gordy Savers, MD  [facility] [phone] [facility]     Future Orders Please Complete By Expires   Diet - low sodium heart healthy      Increase activity slowly          Medication List     As of 06/03/2012  7:06 PM    TAKE these medications         acetaminophen 500 MG tablet   Commonly known as: TYLENOL   Take 1,000 mg by mouth every 6 (six) hours as needed. For pain      aspirin 81 MG tablet   Take 81 mg by mouth daily.      donepezil 5 MG tablet   Commonly known as: ARICEPT   Take 5 mg by mouth at bedtime as needed. DEMENTIA      DULoxetine 60 MG capsule   Commonly known as: CYMBALTA   Take 60 mg by mouth daily.      felodipine 10 MG 24 hr tablet   Commonly known as: PLENDIL   Take 10 mg by mouth daily.      lisinopril-hydrochlorothiazide 20-25 MG per tablet   Commonly known as: PRINZIDE,ZESTORETIC   Take 1 tablet by mouth daily.      meloxicam 15 MG tablet   Commonly known as: MOBIC   Take 15 mg by mouth daily.  metFORMIN 500 MG 24 hr tablet   Commonly known as: GLUCOPHAGE-XR   Take 500 mg by mouth daily with breakfast.      metoprolol succinate 100 MG 24 hr tablet   Commonly known as: TOPROL-XL   Take 100 mg by mouth daily.      sertraline 25 MG tablet   Commonly known as: ZOLOFT   Take 25 mg by mouth daily.      simvastatin 20 MG tablet   Commonly known as: ZOCOR   Take 20 mg by mouth daily.      solifenacin 10 MG tablet   Commonly known as: VESICARE   Take 10 mg by mouth daily.      traMADol 50 MG tablet   Commonly known as: ULTRAM   Take 50 mg by mouth at bedtime.      traZODone 50 MG tablet   Commonly known as: DESYREL   Take 150 mg by mouth at bedtime.           Follow-up Information     Follow up with Rogelia Boga, MD. In 1 week.   Contact information:   [address] [place] [address] [phone]       Follow up with Rollene Rotunda, MD. (they will call you with an appointment)    Contact information:   [address] [place] [address] [phone]           The results of significant diagnostics from this hospitalization (including imaging, microbiology, ancillary and laboratory) are listed below for reference.    Significant Diagnostic Studies: Dg Chest 2 View  06/02/2012  *RADIOLOGY REPORT*  Clinical Data: Cough.  Chest pain.  Generalized weakness.  Current history of hypertension and diabetes.  CHEST - 2 VIEW  Comparison: None.  Findings: Cardiac silhouette upper normal in size.  Hilar and mediastinal contours unremarkable.  Lungs clear.  Bronchovascular markings normal.  Pulmonary vascularity normal.  No pneumothorax. No pleural effusions.  Degenerative changes throughout the thoracic spine.  IMPRESSION: Borderline heart size.  No acute cardiopulmonary disease.   Original Report Authenticated By: Hulan Saas, M.D.        Labs: Basic Metabolic Panel:  Lab 06/03/12 6962 06/02/12 1225 06/02/12 1224  NA 136 136 --  K 3.5 3.8 --  CL 101 99 --  CO2 28 26 --  GLUCOSE 124* 139* --  BUN 19 17 --  CREATININE 0.92 0.91 --  CALCIUM 8.9 9.3 --  MG -- -- 2.2  PHOS -- -- 2.5   CBC:  Lab 06/03/12 0210 06/02/12 1225  WBC 10.4 10.3  NEUTROABS -- --  HGB 12.0 13.6  HCT 35.5* 39.5  MCV 84.3 84.0  PLT 229 287   Cardiac Enzymes:  Lab 06/03/12 0209 06/02/12 2057 06/02/12 1506  CKTOTAL -- -- --  CKMB -- -- --  CKMBINDEX -- -- --  TROPONINI <0.30 <0.30 <0.30   CBG:  Lab 06/03/12 1233 06/03/12 0810 06/02/12 2157 06/02/12 1628 05/29/12 1232  GLUCAP 105* 201* 120* 131* 120*      Time coordinating discharge:45 min  Signed:  Molli Posey, MD [facility] 06/03/2012, 7:06  PM

## 2012-06-06 ENCOUNTER — Emergency Department (HOSPITAL_COMMUNITY): Payer: Medicare Other

## 2012-06-06 ENCOUNTER — Encounter (HOSPITAL_COMMUNITY): Payer: Self-pay | Admitting: *Deleted

## 2012-06-06 ENCOUNTER — Emergency Department (HOSPITAL_COMMUNITY)
Admission: EM | Admit: 2012-06-06 | Discharge: 2012-06-06 | Disposition: A | Discharge status: Home / Self Care | Payer: Medicare Other | Source: Home / Self Care | Attending: Emergency Medicine | Admitting: Emergency Medicine

## 2012-06-06 DIAGNOSIS — Z7982 Long term (current) use of aspirin: Secondary | ICD-10-CM | POA: Insufficient documentation

## 2012-06-06 DIAGNOSIS — Z79899 Other long term (current) drug therapy: Secondary | ICD-10-CM | POA: Insufficient documentation

## 2012-06-06 DIAGNOSIS — K219 Gastro-esophageal reflux disease without esophagitis: Secondary | ICD-10-CM | POA: Insufficient documentation

## 2012-06-06 DIAGNOSIS — F0390 Unspecified dementia without behavioral disturbance: Secondary | ICD-10-CM | POA: Insufficient documentation

## 2012-06-06 DIAGNOSIS — F17200 Nicotine dependence, unspecified, uncomplicated: Secondary | ICD-10-CM | POA: Insufficient documentation

## 2012-06-06 DIAGNOSIS — I2510 Atherosclerotic heart disease of native coronary artery without angina pectoris: Secondary | ICD-10-CM | POA: Insufficient documentation

## 2012-06-06 DIAGNOSIS — M545 Low back pain, unspecified: Secondary | ICD-10-CM | POA: Insufficient documentation

## 2012-06-06 DIAGNOSIS — IMO0002 Reserved for concepts with insufficient information to code with codable children: Secondary | ICD-10-CM | POA: Insufficient documentation

## 2012-06-06 DIAGNOSIS — R32 Unspecified urinary incontinence: Secondary | ICD-10-CM | POA: Insufficient documentation

## 2012-06-06 DIAGNOSIS — F3289 Other specified depressive episodes: Secondary | ICD-10-CM | POA: Insufficient documentation

## 2012-06-06 DIAGNOSIS — I10 Essential (primary) hypertension: Secondary | ICD-10-CM | POA: Insufficient documentation

## 2012-06-06 DIAGNOSIS — F329 Major depressive disorder, single episode, unspecified: Secondary | ICD-10-CM | POA: Insufficient documentation

## 2012-06-06 DIAGNOSIS — M1990 Unspecified osteoarthritis, unspecified site: Secondary | ICD-10-CM | POA: Insufficient documentation

## 2012-06-06 DIAGNOSIS — R451 Restlessness and agitation: Secondary | ICD-10-CM

## 2012-06-06 DIAGNOSIS — E785 Hyperlipidemia, unspecified: Secondary | ICD-10-CM | POA: Insufficient documentation

## 2012-06-06 DIAGNOSIS — E119 Type 2 diabetes mellitus without complications: Secondary | ICD-10-CM | POA: Insufficient documentation

## 2012-06-06 DIAGNOSIS — Z8673 Personal history of transient ischemic attack (TIA), and cerebral infarction without residual deficits: Secondary | ICD-10-CM | POA: Insufficient documentation

## 2012-06-06 LAB — URINALYSIS, ROUTINE W REFLEX MICROSCOPIC
Leukocytes, UA: NEGATIVE
Nitrite: NEGATIVE
Specific Gravity, Urine: 1.018 (ref 1.005–1.030)
Urobilinogen, UA: 0.2 mg/dL (ref 0.0–1.0)

## 2012-06-06 LAB — RAPID URINE DRUG SCREEN, HOSP PERFORMED
Barbiturates: NOT DETECTED
Opiates: NOT DETECTED
Tetrahydrocannabinol: NOT DETECTED

## 2012-06-06 LAB — CBC
HCT: 40.5 % (ref 36.0–46.0)
Hemoglobin: 14.2 g/dL (ref 12.0–15.0)
MCV: 81.8 fL (ref 78.0–100.0)
Platelets: 325 10*3/uL (ref 150–400)
RBC: 4.95 MIL/uL (ref 3.87–5.11)
WBC: 12.9 10*3/uL — ABNORMAL HIGH (ref 4.0–10.5)

## 2012-06-06 LAB — COMPREHENSIVE METABOLIC PANEL
ALT: 31 U/L (ref 0–35)
AST: 30 U/L (ref 0–37)
CO2: 21 mEq/L (ref 19–32)
Chloride: 97 mEq/L (ref 96–112)
Creatinine, Ser: 1.06 mg/dL (ref 0.50–1.10)
GFR calc non Af Amer: 49 mL/min — ABNORMAL LOW (ref >90)
Total Bilirubin: 0.4 mg/dL (ref 0.3–1.2)

## 2012-06-06 MED ORDER — POTASSIUM CHLORIDE 20 MEQ/15ML (10%) PO LIQD
40.0000 meq | Freq: Once | ORAL | Status: AC
  Administered 2012-06-06: 40 meq via ORAL
  Filled 2012-06-06: qty 30

## 2012-06-06 NOTE — ED Notes (Signed)
Per Lauris Poag pt cleared to come back to [facility]

## 2012-06-06 NOTE — ED Notes (Signed)
Pt sent from [facility] under IVC for confusion; left home twice; verbally abusive to grandson; not eating; previous attempt to kill herself

## 2012-06-06 NOTE — ED Provider Notes (Signed)
History     CSN: [id number]  Arrival date & time 06/06/12  2046   First MD Initiated Contact with Patient 06/06/12 2159      Chief Complaint  Patient presents with  . Medical Clearance     The history is provided by the police. History Limited By: Hx dementia.  Pt was seen at 2200.    Per Police and previous chart, pt was sent to the ED under IVC from [facility] for verbally abusive to her family, wandering away from her home. [facility] needs medical clearance for [place] admit. Pt denies any complaints.    Past Medical History  Diagnosis Date  . CORONARY ARTERY DISEASE 07/20/2010  . DEPRESSION 07/20/2010  . DIABETES MELLITUS, TYPE II 07/20/2010  . GERD 07/20/2010  . HYPERLIPIDEMIA 07/20/2010  . HYPERTENSION 07/20/2010  . LOW BACK PAIN 07/20/2010  . OSTEOARTHRITIS 07/20/2010  . URINARY INCONTINENCE 07/20/2010  . TIA (transient ischemic attack)     Past Surgical History  Procedure Date  . Abdominal hysterectomy   . Vitrectomy   . Total knee arthroplasty   . Coronary stent placement     Family History  Problem Relation Age of Onset  . Hypertension Mother     History  Substance Use Topics  . Smoking status: Current Every Day Smoker    Types: Cigarettes  . Smokeless tobacco: Never Used     Comment: She says she will quit now.   . Alcohol Use: Yes    Review of Systems  Unable to perform ROS: Dementia    Allergies  Penicillins  Home Medications   Current Outpatient Rx  Name  Route  Sig  Dispense  Refill  . ASPIRIN 81 MG PO TABS   Oral   Take 81 mg by mouth daily.           . DONEPEZIL HCL 5 MG PO TABS   Oral   Take 5 mg by mouth at bedtime. DEMENTIA         . DULOXETINE HCL 60 MG PO CPEP   Oral   Take 60 mg by mouth daily.         Marland Kitchen FELODIPINE ER 10 MG PO TB24      TAKE 1 TABLET DAILY   90 tablet   0   . LISINOPRIL-HYDROCHLOROTHIAZIDE 20-25 MG PO TABS      TAKE 1 TABLET DAILY   90 tablet   0   . MELOXICAM 15 MG PO TABS      TAKE 1  TABLET DAILY   90 tablet   0   . METFORMIN HCL ER 500 MG PO TB24      TAKE 1 TABLET DAILY WITH BREAKFAST (NEED APPOINTMENT)   90 tablet   0   . METOPROLOL SUCCINATE ER 100 MG PO TB24      TAKE 1 TABLET DAILY   90 tablet   0   . SERTRALINE HCL 25 MG PO TABS   Oral   Take 25 mg by mouth daily.         Marland Kitchen SIMVASTATIN 20 MG PO TABS      TAKE 1 TABLET DAILY   90 tablet   0   . SOLIFENACIN SUCCINATE 10 MG PO TABS   Oral   Take 10 mg by mouth daily.         . TRAMADOL HCL 50 MG PO TABS   Oral   Take 50 mg by mouth at bedtime.         Marland Kitchen  TRAZODONE HCL 50 MG PO TABS   Oral   Take 50 mg by mouth at bedtime.          . ACETAMINOPHEN 500 MG PO TABS   Oral   Take 1,000 mg by mouth every 6 (six) hours as needed. For pain           BP 176/69  Pulse 61  Temp 98.4 F (36.9 C) (Oral)  Resp 18  SpO2 100%  Physical Exam 2205: Physical examination:  Nursing notes reviewed; Vital signs and O2 SAT reviewed;  Constitutional: Well developed, Well nourished, Well hydrated, In no acute distress; Head:  Normocephalic, atraumatic; Eyes: EOMI, PERRL, No scleral icterus; ENMT: Mouth and pharynx normal, Mucous membranes moist; Neck: Supple, Full range of motion, No lymphadenopathy; Cardiovascular: Regular rate and rhythm, No gallop; Respiratory: Breath sounds clear & equal bilaterally, No rales, rhonchi, wheezes.  Speaking full sentences with ease, Normal respiratory effort/excursion; Chest: Nontender, Movement normal; Abdomen: Soft, Nontender, Nondistended, Normal bowel sounds;; Extremities: Pulses normal, No tenderness, No edema, No calf edema or asymmetry.; Neuro: Awake, alert, confused re: time, place, events. Major CN grossly intact.  No facial droop. Speech clear. Gait steady. No gross focal motor or sensory deficits in extremities.; Skin: Color normal, Warm, Dry.; Psych:  Affect flat, poor eye contact.    ED Course  Procedures    MDM  MDM Reviewed: nursing note, vitals  and previous chart Interpretation: ECG, labs and x-ray    Date: 06/06/2012  Rate: 59  Rhythm: normal sinus rhythm  QRS Axis: left  Intervals: normal  ST/T Wave abnormalities: normal  Conduction Disutrbances:left bundle branch block  Narrative Interpretation:   Old EKG Reviewed: unchanged; no significant changes from previous EKG dated 06/02/2012.   Results for orders placed during the hospital encounter of 06/06/12  CBC      Component Value Range   WBC 12.9 (*) 4.0 - 10.5 K/uL   RBC 4.95  3.87 - 5.11 MIL/uL   Hemoglobin 14.2  12.0 - 15.0 g/dL   HCT 45.4  09.8 - 11.9 %   MCV 81.8  78.0 - 100.0 fL   MCH 28.7  26.0 - 34.0 pg   MCHC 35.1  30.0 - 36.0 g/dL   RDW 14.7  82.9 - 56.2 %   Platelets 325  150 - 400 K/uL  COMPREHENSIVE METABOLIC PANEL      Component Value Range   Sodium 134 (*) 135 - 145 mEq/L   Potassium 3.1 (*) 3.5 - 5.1 mEq/L   Chloride 97  96 - 112 mEq/L   CO2 21  19 - 32 mEq/L   Glucose, Bld 125 (*) 70 - 99 mg/dL   BUN 24 (*) 6 - 23 mg/dL   Creatinine, Ser 1.30  0.50 - 1.10 mg/dL   Calcium 9.5  8.4 - 86.5 mg/dL   Total Protein 7.3  6.0 - 8.3 g/dL   Albumin 3.9  3.5 - 5.2 g/dL   AST 30  0 - 37 U/L   ALT 31  0 - 35 U/L   Alkaline Phosphatase 93  39 - 117 U/L   Total Bilirubin 0.4  0.3 - 1.2 mg/dL   GFR calc non Af Amer 49 (*) >90 mL/min   GFR calc Af Amer 57 (*) >90 mL/min  ETHANOL      Component Value Range   Alcohol, Ethyl (B) <11  0 - 11 mg/dL  URINE RAPID DRUG SCREEN (HOSP PERFORMED)      Component Value  Range   Opiates NONE DETECTED  NONE DETECTED   Cocaine NONE DETECTED  NONE DETECTED   Benzodiazepines NONE DETECTED  NONE DETECTED   Amphetamines NONE DETECTED  NONE DETECTED   Tetrahydrocannabinol NONE DETECTED  NONE DETECTED   Barbiturates NONE DETECTED  NONE DETECTED  URINALYSIS, ROUTINE W REFLEX MICROSCOPIC      Component Value Range   Color, Urine YELLOW  YELLOW   APPearance CLEAR  CLEAR   Specific Gravity, Urine 1.018  1.005 - 1.030   pH  5.0  5.0 - 8.0   Glucose, UA NEGATIVE  NEGATIVE mg/dL   Hgb urine dipstick NEGATIVE  NEGATIVE   Bilirubin Urine NEGATIVE  NEGATIVE   Ketones, ur NEGATIVE  NEGATIVE mg/dL   Protein, ur NEGATIVE  NEGATIVE mg/dL   Urobilinogen, UA 0.2  0.0 - 1.0 mg/dL   Nitrite NEGATIVE  NEGATIVE   Leukocytes, UA NEGATIVE  NEGATIVE   Dg Chest 2 View 06/06/2012  *RADIOLOGY REPORT*  Clinical Data:  Medical clearance and confusion.  History of hypertension and coronary artery disease.  CHEST - 2 VIEW  Comparison: 06/02/2012  Findings:  Stable heart which is top normal in size.  No evidence of edema, infiltrate or pulmonary nodule.  Both lungs are clear. Stable spondylosis of the thoracic spine.  IMPRESSION: No active disease.   Original Report Authenticated By: Irish Lack, M.D.      2330:  Potassium repleted PO.  OK to be d/c to go back to [place] with Police under IVC.          Laray Anger, DO 06/08/12 1523

## 2012-06-08 ENCOUNTER — Encounter (HOSPITAL_COMMUNITY): Payer: Self-pay | Admitting: *Deleted

## 2012-06-08 ENCOUNTER — Emergency Department (HOSPITAL_COMMUNITY): Payer: Medicare Other

## 2012-06-08 ENCOUNTER — Inpatient Hospital Stay (HOSPITAL_COMMUNITY)
Admission: EM | Admit: 2012-06-08 | Discharge: 2012-06-15 | DRG: 291 | Disposition: A | Discharge status: Nursing Home (Includes ICF) | Payer: Medicare Other | Attending: Family Medicine | Admitting: Family Medicine

## 2012-06-08 DIAGNOSIS — F329 Major depressive disorder, single episode, unspecified: Secondary | ICD-10-CM

## 2012-06-08 DIAGNOSIS — G9340 Encephalopathy, unspecified: Secondary | ICD-10-CM | POA: Diagnosis present

## 2012-06-08 DIAGNOSIS — D72829 Elevated white blood cell count, unspecified: Secondary | ICD-10-CM

## 2012-06-08 DIAGNOSIS — E785 Hyperlipidemia, unspecified: Secondary | ICD-10-CM

## 2012-06-08 DIAGNOSIS — F3289 Other specified depressive episodes: Secondary | ICD-10-CM | POA: Diagnosis present

## 2012-06-08 DIAGNOSIS — E119 Type 2 diabetes mellitus without complications: Secondary | ICD-10-CM

## 2012-06-08 DIAGNOSIS — I2510 Atherosclerotic heart disease of native coronary artery without angina pectoris: Secondary | ICD-10-CM

## 2012-06-08 DIAGNOSIS — Z8673 Personal history of transient ischemic attack (TIA), and cerebral infarction without residual deficits: Secondary | ICD-10-CM

## 2012-06-08 DIAGNOSIS — F0390 Unspecified dementia without behavioral disturbance: Secondary | ICD-10-CM

## 2012-06-08 DIAGNOSIS — E876 Hypokalemia: Secondary | ICD-10-CM

## 2012-06-08 DIAGNOSIS — I509 Heart failure, unspecified: Secondary | ICD-10-CM | POA: Diagnosis present

## 2012-06-08 DIAGNOSIS — R4182 Altered mental status, unspecified: Secondary | ICD-10-CM | POA: Diagnosis present

## 2012-06-08 DIAGNOSIS — I5031 Acute diastolic (congestive) heart failure: Principal | ICD-10-CM

## 2012-06-08 DIAGNOSIS — F32A Depression, unspecified: Secondary | ICD-10-CM

## 2012-06-08 DIAGNOSIS — I10 Essential (primary) hypertension: Secondary | ICD-10-CM

## 2012-06-08 DIAGNOSIS — N390 Urinary tract infection, site not specified: Secondary | ICD-10-CM

## 2012-06-08 DIAGNOSIS — Z79899 Other long term (current) drug therapy: Secondary | ICD-10-CM

## 2012-06-08 LAB — BASIC METABOLIC PANEL
CO2: 23 mEq/L (ref 19–32)
GFR calc non Af Amer: 61 mL/min — ABNORMAL LOW (ref >90)
Glucose, Bld: 137 mg/dL — ABNORMAL HIGH (ref 70–99)
Potassium: 3.3 mEq/L — ABNORMAL LOW (ref 3.5–5.1)
Sodium: 138 mEq/L (ref 135–145)

## 2012-06-08 LAB — CBC WITH DIFFERENTIAL/PLATELET
Lymphocytes Relative: 10 % — ABNORMAL LOW (ref 12–46)
Lymphs Abs: 2 10*3/uL (ref 0.7–4.0)
Neutrophils Relative %: 83 % — ABNORMAL HIGH (ref 43–77)
Platelets: 293 10*3/uL (ref 150–400)
RBC: 4.93 MIL/uL (ref 3.87–5.11)
WBC: 20.5 10*3/uL — ABNORMAL HIGH (ref 4.0–10.5)

## 2012-06-08 LAB — URINALYSIS, ROUTINE W REFLEX MICROSCOPIC
Glucose, UA: NEGATIVE mg/dL
Hgb urine dipstick: NEGATIVE
Specific Gravity, Urine: 1.017 (ref 1.005–1.030)
Urobilinogen, UA: 0.2 mg/dL (ref 0.0–1.0)

## 2012-06-08 LAB — URINE MICROSCOPIC-ADD ON

## 2012-06-08 MED ORDER — POTASSIUM CHLORIDE CRYS ER 20 MEQ PO TBCR
40.0000 meq | EXTENDED_RELEASE_TABLET | Freq: Once | ORAL | Status: AC
  Administered 2012-06-08: 40 meq via ORAL
  Filled 2012-06-08: qty 2

## 2012-06-08 MED ORDER — SODIUM CHLORIDE 0.9 % IV SOLN
INTRAVENOUS | Status: AC
  Administered 2012-06-08: via INTRAVENOUS

## 2012-06-08 MED ORDER — LEVOFLOXACIN IN D5W 500 MG/100ML IV SOLN
500.0000 mg | Freq: Once | INTRAVENOUS | Status: AC
  Administered 2012-06-08: 500 mg via INTRAVENOUS
  Filled 2012-06-08: qty 100

## 2012-06-08 NOTE — ED Notes (Signed)
Vesta Mixer (spoke with Janora Norlander, RN) also called and informed that pt was being admitted (medically).

## 2012-06-08 NOTE — ED Notes (Signed)
Up to the bathroom 

## 2012-06-08 NOTE — ED Notes (Signed)
Dr steinl into see 

## 2012-06-08 NOTE — ED Notes (Signed)
Patient ambulated to X-ray 

## 2012-06-08 NOTE — ED Provider Notes (Addendum)
History     CSN: [id number]  Arrival date & time 06/08/12  1727   First MD Initiated Contact with Patient 06/08/12 1914      Chief Complaint  Patient presents with  . Medical Clearance    (Consider location/radiation/quality/duration/timing/severity/associated sxs/prior treatment) HPI Pt with hx depression, dementia, from [facility] where patient has been awaiting psych placement. Per report, pts k mildly low and has hx chronic hypertension, so sent to ED.  Pt states feels fine, denies any c/o. States eating and drinking normally. No headache. No nv. No abd pain. No cp or sob. No edema. States has been taking her normal medication. Denies dysuria, + urine frequency,urgency. No cough or uri c/o. No fever or chills. States does feel depressed. Denies thoughts of harm to self or others.     Past Medical History  Diagnosis Date  . CORONARY ARTERY DISEASE 07/20/2010  . DEPRESSION 07/20/2010  . DIABETES MELLITUS, TYPE II 07/20/2010  . GERD 07/20/2010  . HYPERLIPIDEMIA 07/20/2010  . HYPERTENSION 07/20/2010  . LOW BACK PAIN 07/20/2010  . OSTEOARTHRITIS 07/20/2010  . URINARY INCONTINENCE 07/20/2010  . TIA (transient ischemic attack)     Past Surgical History  Procedure Date  . Abdominal hysterectomy   . Vitrectomy   . Total knee arthroplasty   . Coronary stent placement     Family History  Problem Relation Age of Onset  . Hypertension Mother     History  Substance Use Topics  . Smoking status: Current Every Day Smoker -- 0.5 packs/day    Types: Cigarettes  . Smokeless tobacco: Never Used     Comment: She says she will quit now.   . Alcohol Use: No    OB History    Grav Para Term Preterm Abortions TAB SAB Ect Mult Living                  Review of Systems  Constitutional: Negative for fever.  HENT: Negative for neck pain.   Eyes: Negative for redness.  Respiratory: Negative for cough and shortness of breath.   Cardiovascular: Negative for chest pain.   Gastrointestinal: Negative for vomiting, abdominal pain and diarrhea.  Genitourinary: Positive for frequency. Negative for dysuria and flank pain.  Musculoskeletal: Negative for back pain.  Skin: Negative for rash.  Neurological: Negative for headaches.  Hematological: Does not bruise/bleed easily.  Psychiatric/Behavioral: Positive for decreased concentration.    Allergies  Penicillins  Home Medications   Current Outpatient Rx  Name  Route  Sig  Dispense  Refill  . ACETAMINOPHEN 500 MG PO TABS   Oral   Take 1,000 mg by mouth every 6 (six) hours as needed. For pain         . ASPIRIN 81 MG PO TABS   Oral   Take 81 mg by mouth daily.           . DONEPEZIL HCL 5 MG PO TABS   Oral   Take 5 mg by mouth at bedtime. DEMENTIA         . DULOXETINE HCL 60 MG PO CPEP   Oral   Take 60 mg by mouth daily.         Marland Kitchen FELODIPINE ER 10 MG PO TB24   Oral   Take 10 mg by mouth daily.         Marland Kitchen LISINOPRIL-HYDROCHLOROTHIAZIDE 20-25 MG PO TABS      TAKE 1 TABLET DAILY   90 tablet   0   .  MELOXICAM 15 MG PO TABS   Oral   Take 15 mg by mouth daily.         Marland Kitchen METFORMIN HCL ER 500 MG PO TB24   Oral   Take 500 mg by mouth daily with breakfast.         . METOPROLOL SUCCINATE ER 100 MG PO TB24   Oral   Take 100 mg by mouth daily. Take with or immediately following a meal.         . SERTRALINE HCL 25 MG PO TABS   Oral   Take 25 mg by mouth daily.         Marland Kitchen SIMVASTATIN 20 MG PO TABS   Oral   Take 20 mg by mouth daily.         Marland Kitchen SOLIFENACIN SUCCINATE 10 MG PO TABS   Oral   Take 10 mg by mouth daily.         . TRAMADOL HCL 50 MG PO TABS   Oral   Take 50 mg by mouth at bedtime.         . TRAZODONE HCL 50 MG PO TABS   Oral   Take 50 mg by mouth at bedtime.            BP 189/75  Pulse 60  Temp 98.8 F (37.1 C) (Oral)  Resp 20  SpO2 97%  Physical Exam  Nursing note and vitals reviewed. Constitutional: She appears well-developed and  well-nourished. No distress.  HENT:  Head: Atraumatic.  Mouth/Throat: Oropharynx is clear and moist.  Eyes: Conjunctivae normal are normal. Pupils are equal, round, and reactive to light. No scleral icterus.  Neck: Neck supple. No tracheal deviation present.  Cardiovascular: Normal rate, regular rhythm, normal heart sounds and intact distal pulses.   Pulmonary/Chest: Effort normal and breath sounds normal. No respiratory distress.  Abdominal: Soft. Normal appearance. She exhibits no distension. There is no tenderness.  Musculoskeletal: She exhibits no edema and no tenderness.  Neurological: She is alert.       Alert, speech fluent. Motor intact bil.   Skin: Skin is warm and dry. No rash noted.  Psychiatric: She has a normal mood and affect.    ED Course  Procedures (including critical care time)   Labs Reviewed  CBC WITH DIFFERENTIAL  BASIC METABOLIC PANEL  URINALYSIS, ROUTINE W REFLEX MICROSCOPIC   Results for orders placed during the hospital encounter of 06/08/12  CBC WITH DIFFERENTIAL      Component Value Range   WBC 20.5 (*) 4.0 - 10.5 K/uL   RBC 4.93  3.87 - 5.11 MIL/uL   Hemoglobin 14.1  12.0 - 15.0 g/dL   HCT 16.1  09.6 - 04.5 %   MCV 82.2  78.0 - 100.0 fL   MCH 28.6  26.0 - 34.0 pg   MCHC 34.8  30.0 - 36.0 g/dL   RDW 40.9  81.1 - 91.4 %   Platelets 293  150 - 400 K/uL   Neutrophils Relative 83 (*) 43 - 77 %   Neutro Abs 17.0 (*) 1.7 - 7.7 K/uL   Lymphocytes Relative 10 (*) 12 - 46 %   Lymphs Abs 2.0  0.7 - 4.0 K/uL   Monocytes Relative 7  3 - 12 %   Monocytes Absolute 1.3 (*) 0.1 - 1.0 K/uL   Eosinophils Relative 1  0 - 5 %   Eosinophils Absolute 0.1  0.0 - 0.7 K/uL   Basophils Relative 0  0 -  1 %   Basophils Absolute 0.0  0.0 - 0.1 K/uL  BASIC METABOLIC PANEL      Component Value Range   Sodium 138  135 - 145 mEq/L   Potassium 3.3 (*) 3.5 - 5.1 mEq/L   Chloride 101  96 - 112 mEq/L   CO2 23  19 - 32 mEq/L   Glucose, Bld 137 (*) 70 - 99 mg/dL   BUN 21  6  - 23 mg/dL   Creatinine, Ser 1.61  0.50 - 1.10 mg/dL   Calcium 9.7  8.4 - 09.6 mg/dL   GFR calc non Af Amer 61 (*) >90 mL/min   GFR calc Af Amer 71 (*) >90 mL/min  URINALYSIS, ROUTINE W REFLEX MICROSCOPIC      Component Value Range   Color, Urine YELLOW  YELLOW   APPearance CLEAR  CLEAR   Specific Gravity, Urine 1.017  1.005 - 1.030   pH 5.5  5.0 - 8.0   Glucose, UA NEGATIVE  NEGATIVE mg/dL   Hgb urine dipstick NEGATIVE  NEGATIVE   Bilirubin Urine NEGATIVE  NEGATIVE   Ketones, ur NEGATIVE  NEGATIVE mg/dL   Protein, ur 30 (*) NEGATIVE mg/dL   Urobilinogen, UA 0.2  0.0 - 1.0 mg/dL   Nitrite NEGATIVE  NEGATIVE   Leukocytes, UA MODERATE (*) NEGATIVE  URINE MICROSCOPIC-ADD ON      Component Value Range   Squamous Epithelial / LPF RARE  RARE   WBC, UA 3-6  <3 WBC/hpf   Bacteria, UA RARE  RARE   Casts HYALINE CASTS (*) NEGATIVE   Dg Chest 2 View  06/08/2012  *RADIOLOGY REPORT*  Clinical Data: 78 year old female with chest pain.  CHEST - 2 VIEW  Comparison: 06/06/2012  Findings: Upper limits normal heart size noted. The lungs are clear. There is no evidence of focal airspace disease, pulmonary edema, suspicious pulmonary nodule/mass, pleural effusion, or pneumothorax. No acute bony abnormalities are identified.  IMPRESSION: No evidence of acute cardiopulmonary disease.   Original Report Authenticated By: Harmon Pier, M.D.    Dg Chest 2 View  06/06/2012  *RADIOLOGY REPORT*  Clinical Data:  Medical clearance and confusion.  History of hypertension and coronary artery disease.  CHEST - 2 VIEW  Comparison: 06/02/2012  Findings:  Stable heart which is top normal in size.  No evidence of edema, infiltrate or pulmonary nodule.  Both lungs are clear. Stable spondylosis of the thoracic spine.  IMPRESSION: No active disease.   Original Report Authenticated By: Irish Lack, M.D.    Dg Chest 2 View  06/02/2012  *RADIOLOGY REPORT*  Clinical Data: Cough.  Chest pain.  Generalized weakness.  Current  history of hypertension and diabetes.  CHEST - 2 VIEW  Comparison: None.  Findings: Cardiac silhouette upper normal in size.  Hilar and mediastinal contours unremarkable.  Lungs clear.  Bronchovascular markings normal.  Pulmonary vascularity normal.  No pneumothorax. No pleural effusions.  Degenerative changes throughout the thoracic spine.  IMPRESSION: Borderline heart size.  No acute cardiopulmonary disease.   Original Report Authenticated By: Hulan Saas, M.D.        MDM  Pt from [facility] awaiting psych placement. Will check labs.  Reviewed nursing notes and prior charts for additional history.   Wbc 20. Pt denies chills/sweats. No pain. No uri c/o. cxr neg. No abd pain. No abd soft nt. Few wbc and mod LE on ua - will culture as rx. No rectal pain, abscess or sacral skin breakdown/ulcer. No leg pain/cellulitis or other apparent  source of infection. Pt elevated but not requiring emergent rx.   Given elevated wbc, htn, mildly low k, [facility] will not accept pt back and indicates psych facility will not accept without period of medical observation.   Given leucocytosis, possible uti, htn, and inability to place into inpt psych facility without period med obs, etc, med service/[facility] called to obs/admit.   [facility], [place], team 8      Suzi Roots, MD 06/08/12 2239  Suzi Roots, MD 06/08/12 [phone]

## 2012-06-08 NOTE — ED Notes (Signed)
Pt transported via stretcher to 1526 accompanied by this nurse with chart, including IVC papers and 2 bags of labelled personal belongings, pt remains confused and easily agitated but stable.

## 2012-06-08 NOTE — ED Notes (Signed)
[facility] staff aware of pt's admission, leaving now, will monitor pt.

## 2012-06-08 NOTE — ED Notes (Signed)
Per Dr. Denton Lank, pt will be sent back to [facility], [facility] staff informed, will monitor.

## 2012-06-08 NOTE — ED Notes (Signed)
Pt brought by [facility].  Has a bed at [facility] but isn't accepted b/c her potassium is low, BP and WBC's are high.  PT is IVC.

## 2012-06-08 NOTE — ED Notes (Signed)
MD at bedside. 

## 2012-06-08 NOTE — ED Notes (Signed)
2 bags of belongings locked in locker # 30.  Pt changed into scrubs and wanted

## 2012-06-08 NOTE — ED Notes (Signed)
Lab in to draw

## 2012-06-09 ENCOUNTER — Encounter (HOSPITAL_COMMUNITY): Payer: Self-pay | Admitting: Internal Medicine

## 2012-06-09 DIAGNOSIS — E785 Hyperlipidemia, unspecified: Secondary | ICD-10-CM

## 2012-06-09 DIAGNOSIS — R4182 Altered mental status, unspecified: Secondary | ICD-10-CM

## 2012-06-09 DIAGNOSIS — I10 Essential (primary) hypertension: Secondary | ICD-10-CM | POA: Diagnosis present

## 2012-06-09 DIAGNOSIS — I2510 Atherosclerotic heart disease of native coronary artery without angina pectoris: Secondary | ICD-10-CM

## 2012-06-09 DIAGNOSIS — N390 Urinary tract infection, site not specified: Secondary | ICD-10-CM

## 2012-06-09 DIAGNOSIS — E876 Hypokalemia: Secondary | ICD-10-CM

## 2012-06-09 DIAGNOSIS — F32A Depression, unspecified: Secondary | ICD-10-CM | POA: Diagnosis present

## 2012-06-09 DIAGNOSIS — D72829 Elevated white blood cell count, unspecified: Secondary | ICD-10-CM | POA: Diagnosis present

## 2012-06-09 DIAGNOSIS — R6889 Other general symptoms and signs: Secondary | ICD-10-CM

## 2012-06-09 DIAGNOSIS — F329 Major depressive disorder, single episode, unspecified: Secondary | ICD-10-CM | POA: Diagnosis present

## 2012-06-09 LAB — TROPONIN I: Troponin I: <0.3 ng/mL (ref <0.30)

## 2012-06-09 LAB — CBC
HCT: 38.9 % (ref 36.0–46.0)
Hemoglobin: 13.5 g/dL (ref 12.0–15.0)
MCH: 28.7 pg (ref 26.0–34.0)
MCHC: 34.7 g/dL (ref 30.0–36.0)
RDW: 14 % (ref 11.5–15.5)

## 2012-06-09 LAB — HEMOGLOBIN A1C: Mean Plasma Glucose: 134 mg/dL — ABNORMAL HIGH (ref <117)

## 2012-06-09 LAB — BASIC METABOLIC PANEL
BUN: 20 mg/dL (ref 6–23)
Chloride: 100 mEq/L (ref 96–112)
Creatinine, Ser: 0.86 mg/dL (ref 0.50–1.10)
Glucose, Bld: 129 mg/dL — ABNORMAL HIGH (ref 70–99)
Potassium: 3.7 mEq/L (ref 3.5–5.1)

## 2012-06-09 LAB — GLUCOSE, CAPILLARY
Glucose-Capillary: 120 mg/dL — ABNORMAL HIGH (ref 70–99)
Glucose-Capillary: 130 mg/dL — ABNORMAL HIGH (ref 70–99)

## 2012-06-09 LAB — MAGNESIUM: Magnesium: 2 mg/dL (ref 1.5–2.5)

## 2012-06-09 LAB — PRO B NATRIURETIC PEPTIDE: Pro B Natriuretic peptide (BNP): 1017 pg/mL — ABNORMAL HIGH (ref 0–450)

## 2012-06-09 MED ORDER — INSULIN ASPART 100 UNIT/ML ~~LOC~~ SOLN
0.0000 [IU] | Freq: Three times a day (TID) | SUBCUTANEOUS | Status: DC
  Administered 2012-06-09: 1 [IU] via SUBCUTANEOUS
  Administered 2012-06-11: 2 [IU] via SUBCUTANEOUS
  Administered 2012-06-11 – 2012-06-14 (×5): 1 [IU] via SUBCUTANEOUS

## 2012-06-09 MED ORDER — ONDANSETRON HCL 4 MG/2ML IJ SOLN: 4.0000 mg | Freq: Four times a day (QID) | INTRAMUSCULAR | Status: DC | PRN

## 2012-06-09 MED ORDER — FUROSEMIDE 10 MG/ML IJ SOLN
20.0000 mg | Freq: Two times a day (BID) | INTRAMUSCULAR | Status: DC
  Administered 2012-06-09: 20 mg via INTRAVENOUS
  Filled 2012-06-09 (×5): qty 2

## 2012-06-09 MED ORDER — ENOXAPARIN SODIUM 40 MG/0.4ML ~~LOC~~ SOLN: 40.0000 mg | SUBCUTANEOUS | Status: DC

## 2012-06-09 MED ORDER — ENOXAPARIN SODIUM 40 MG/0.4ML ~~LOC~~ SOLN
40.0000 mg | SUBCUTANEOUS | Status: DC
  Administered 2012-06-09 – 2012-06-15 (×7): 40 mg via SUBCUTANEOUS
  Filled 2012-06-09 (×7): qty 0.4

## 2012-06-09 MED ORDER — SODIUM CHLORIDE 0.9 % IJ SOLN: 3.0000 mL | Freq: Two times a day (BID) | INTRAMUSCULAR | Status: DC

## 2012-06-09 MED ORDER — ONDANSETRON HCL 4 MG PO TABS: 4.0000 mg | ORAL_TABLET | Freq: Four times a day (QID) | ORAL | Status: DC | PRN

## 2012-06-09 MED ORDER — INSULIN ASPART 100 UNIT/ML ~~LOC~~ SOLN: 0.0000 [IU] | Freq: Every day | SUBCUTANEOUS | Status: DC

## 2012-06-09 MED ORDER — METOPROLOL SUCCINATE ER 100 MG PO TB24
100.0000 mg | ORAL_TABLET | Freq: Every day | ORAL | Status: DC
  Administered 2012-06-09 – 2012-06-15 (×7): 100 mg via ORAL
  Filled 2012-06-09 (×8): qty 1

## 2012-06-09 MED ORDER — MELOXICAM 15 MG PO TABS
15.0000 mg | ORAL_TABLET | Freq: Every day | ORAL | Status: DC
  Administered 2012-06-09 – 2012-06-15 (×7): 15 mg via ORAL
  Filled 2012-06-09 (×7): qty 1

## 2012-06-09 MED ORDER — HYDRALAZINE HCL 20 MG/ML IJ SOLN
10.0000 mg | Freq: Four times a day (QID) | INTRAMUSCULAR | Status: DC | PRN
Start: 2012-06-09 — End: 2012-06-13
  Administered 2012-06-09 (×2): 10 mg via INTRAVENOUS
  Filled 2012-06-09 (×2): qty 1

## 2012-06-09 MED ORDER — SIMVASTATIN 20 MG PO TABS
20.0000 mg | ORAL_TABLET | Freq: Every day | ORAL | Status: DC
Start: 2012-06-09 — End: 2012-06-15
  Administered 2012-06-09 – 2012-06-15 (×7): 20 mg via ORAL
  Filled 2012-06-09 (×7): qty 1

## 2012-06-09 MED ORDER — HYDROMORPHONE HCL PF 1 MG/ML IJ SOLN
0.5000 mg | INTRAMUSCULAR | Status: DC | PRN
  Administered 2012-06-09: 1 mg via INTRAVENOUS
  Filled 2012-06-09: qty 1

## 2012-06-09 MED ORDER — SODIUM CHLORIDE 0.9 % IV SOLN
INTRAVENOUS | Status: DC
  Administered 2012-06-09: 01:00:00 via INTRAVENOUS

## 2012-06-09 MED ORDER — DULOXETINE HCL 60 MG PO CPEP
60.0000 mg | ORAL_CAPSULE | Freq: Every day | ORAL | Status: DC
  Administered 2012-06-09 – 2012-06-15 (×7): 60 mg via ORAL
  Filled 2012-06-09 (×7): qty 1

## 2012-06-09 MED ORDER — ALUM & MAG HYDROXIDE-SIMETH 200-200-20 MG/5ML PO SUSP: 30.0000 mL | Freq: Four times a day (QID) | ORAL | Status: DC | PRN

## 2012-06-09 MED ORDER — FELODIPINE ER 10 MG PO TB24
10.0000 mg | ORAL_TABLET | Freq: Every day | ORAL | Status: DC
  Administered 2012-06-09 – 2012-06-15 (×7): 10 mg via ORAL
  Filled 2012-06-09 (×7): qty 1

## 2012-06-09 MED ORDER — ZOLPIDEM TARTRATE 5 MG PO TABS
5.0000 mg | ORAL_TABLET | Freq: Every evening | ORAL | Status: DC | PRN
  Administered 2012-06-09 – 2012-06-15 (×4): 5 mg via ORAL
  Filled 2012-06-09 (×4): qty 1

## 2012-06-09 MED ORDER — HYDROCHLOROTHIAZIDE 25 MG PO TABS
25.0000 mg | ORAL_TABLET | Freq: Every day | ORAL | Status: DC
  Administered 2012-06-09 – 2012-06-15 (×7): 25 mg via ORAL
  Filled 2012-06-09 (×8): qty 1

## 2012-06-09 MED ORDER — OXYCODONE HCL 5 MG PO TABS
5.0000 mg | ORAL_TABLET | ORAL | Status: DC | PRN
  Administered 2012-06-11 – 2012-06-13 (×2): 5 mg via ORAL
  Filled 2012-06-09 (×2): qty 1

## 2012-06-09 MED ORDER — SODIUM CHLORIDE 0.9 % IV SOLN: 250.0000 mL | INTRAVENOUS | Status: DC | PRN

## 2012-06-09 MED ORDER — DONEPEZIL HCL 5 MG PO TABS
5.0000 mg | ORAL_TABLET | Freq: Every day | ORAL | Status: DC
  Administered 2012-06-09 – 2012-06-14 (×7): 5 mg via ORAL
  Filled 2012-06-09 (×12): qty 1

## 2012-06-09 MED ORDER — ACETAMINOPHEN 325 MG PO TABS
650.0000 mg | ORAL_TABLET | Freq: Four times a day (QID) | ORAL | Status: DC | PRN
  Administered 2012-06-09 – 2012-06-13 (×2): 650 mg via ORAL
  Filled 2012-06-09 (×2): qty 2

## 2012-06-09 MED ORDER — ACETAMINOPHEN 650 MG RE SUPP: 650.0000 mg | Freq: Four times a day (QID) | RECTAL | Status: DC | PRN

## 2012-06-09 MED ORDER — POTASSIUM CHLORIDE CRYS ER 20 MEQ PO TBCR
40.0000 meq | EXTENDED_RELEASE_TABLET | Freq: Once | ORAL | Status: AC
  Administered 2012-06-09: 40 meq via ORAL
  Filled 2012-06-09 (×2): qty 2

## 2012-06-09 MED ORDER — LISINOPRIL-HYDROCHLOROTHIAZIDE 20-25 MG PO TABS: 1.0000 | ORAL_TABLET | Freq: Every day | ORAL | Status: DC

## 2012-06-09 MED ORDER — ACETAMINOPHEN 325 MG PO TABS: 650.0000 mg | ORAL_TABLET | Freq: Four times a day (QID) | ORAL | Status: DC | PRN

## 2012-06-09 MED ORDER — ZOLPIDEM TARTRATE 5 MG PO TABS: 5.0000 mg | ORAL_TABLET | Freq: Every evening | ORAL | Status: DC | PRN

## 2012-06-09 MED ORDER — ASPIRIN 81 MG PO CHEW
81.0000 mg | CHEWABLE_TABLET | Freq: Every day | ORAL | Status: DC
  Administered 2012-06-09 – 2012-06-15 (×7): 81 mg via ORAL
  Filled 2012-06-09 (×7): qty 1

## 2012-06-09 MED ORDER — SODIUM CHLORIDE 0.9 % IJ SOLN: 3.0000 mL | INTRAMUSCULAR | Status: DC | PRN

## 2012-06-09 MED ORDER — LISINOPRIL 20 MG PO TABS
20.0000 mg | ORAL_TABLET | Freq: Every day | ORAL | Status: DC
  Administered 2012-06-09 – 2012-06-15 (×7): 20 mg via ORAL
  Filled 2012-06-09 (×8): qty 1

## 2012-06-09 MED ORDER — DARIFENACIN HYDROBROMIDE ER 15 MG PO TB24
15.0000 mg | ORAL_TABLET | Freq: Every day | ORAL | Status: DC
  Administered 2012-06-09 – 2012-06-15 (×7): 15 mg via ORAL
  Filled 2012-06-09 (×7): qty 1

## 2012-06-09 MED ORDER — HYDROMORPHONE HCL PF 1 MG/ML IJ SOLN: 0.5000 mg | INTRAMUSCULAR | Status: DC | PRN

## 2012-06-09 MED ORDER — METFORMIN HCL ER 500 MG PO TB24
500.0000 mg | ORAL_TABLET | Freq: Every day | ORAL | Status: DC
  Administered 2012-06-09 – 2012-06-10 (×2): 500 mg via ORAL
  Filled 2012-06-09 (×3): qty 1

## 2012-06-09 MED ORDER — OXYCODONE HCL 5 MG PO TABS: 5.0000 mg | ORAL_TABLET | ORAL | Status: DC | PRN

## 2012-06-09 MED ORDER — LEVOFLOXACIN IN D5W 500 MG/100ML IV SOLN
500.0000 mg | INTRAVENOUS | Status: DC
  Administered 2012-06-09: 500 mg via INTRAVENOUS
  Filled 2012-06-09: qty 100

## 2012-06-09 NOTE — H&P (Signed)
[facility] History and Physical  Glenda Underwood ZOX:[id number] DOB: 1933/01/20 DOA: 06/08/2012  Referring physician:  PCP: Rogelia Boga, MD  Specialists:   Chief Complaint: Severe Depression  HPI: Glenda Underwood is a 78 y.o. female who was sent to the ED by [facility] due to multiple abnormalities on her laboratory studies.  She was to be admitted to the [facility].   Patient reports having severe depression over the past year which has worsened over the past 2 months.  She also reports having problems with her concentration and memory.     Review of Systems: The patient denies anorexia, fever, weight loss,, vision loss, decreased hearing, hoarseness, chest pain, syncope, dyspnea on exertion, peripheral edema, balance deficits, hemoptysis, abdominal pain, melena, hematochezia, severe indigestion/heartburn, hematuria, incontinence, genital sores, muscle weakness, suspicious skin lesions, transient blindness, difficulty walking, depression, unusual weight change, abnormal bleeding, enlarged lymph nodes, angioedema, and breast masses.    Past Medical History  Diagnosis Date  . CORONARY ARTERY DISEASE 07/20/2010  . DEPRESSION 07/20/2010  . DIABETES MELLITUS, TYPE II 07/20/2010  . GERD 07/20/2010  . HYPERLIPIDEMIA 07/20/2010  . HYPERTENSION 07/20/2010  . LOW BACK PAIN 07/20/2010  . OSTEOARTHRITIS 07/20/2010  . URINARY INCONTINENCE 07/20/2010  . TIA (transient ischemic attack)    Past Surgical History  Procedure Date  . Abdominal hysterectomy   . Vitrectomy   . Total knee arthroplasty   . Coronary stent placement     Medications:  HOME MEDS: Prior to Admission medications   Medication Sig Start Date End Date Taking? Authorizing Provider  acetaminophen (TYLENOL) 500 MG tablet Take 1,000 mg by mouth every 6 (six) hours as needed. For pain   Yes Historical Provider, MD  aspirin 81 MG tablet Take 81 mg by mouth daily.     Yes  Historical Provider, MD  donepezil (ARICEPT) 5 MG tablet Take 5 mg by mouth at bedtime. DEMENTIA 05/01/12  Yes Gordy Savers, MD  DULoxetine (CYMBALTA) 60 MG capsule Take 60 mg by mouth daily. 03/13/12  Yes Gordy Savers, MD  felodipine (PLENDIL) 10 MG 24 hr tablet Take 10 mg by mouth daily.   Yes Historical Provider, MD  lisinopril-hydrochlorothiazide (PRINZIDE,ZESTORETIC) 20-25 MG per tablet TAKE 1 TABLET DAILY 06/02/12  Yes Gordy Savers, MD  meloxicam (MOBIC) 15 MG tablet Take 15 mg by mouth daily.   Yes Historical Provider, MD  metFORMIN (GLUCOPHAGE-XR) 500 MG 24 hr tablet Take 500 mg by mouth daily with breakfast.   Yes Historical Provider, MD  metoprolol succinate (TOPROL-XL) 100 MG 24 hr tablet Take 100 mg by mouth daily. Take with or immediately following a meal.   Yes Historical Provider, MD  sertraline (ZOLOFT) 25 MG tablet Take 25 mg by mouth daily. 05/01/12  Yes Gordy Savers, MD  simvastatin (ZOCOR) 20 MG tablet Take 20 mg by mouth daily.   Yes Historical Provider, MD  solifenacin (VESICARE) 10 MG tablet Take 10 mg by mouth daily. 03/13/12 03/13/13 Yes Gordy Savers, MD  traMADol (ULTRAM) 50 MG tablet Take 50 mg by mouth at bedtime. 03/13/12  Yes Gordy Savers, MD  traZODone (DESYREL) 50 MG tablet Take 50 mg by mouth at bedtime.  03/13/12  Yes Gordy Savers, MD    Allergies:  Allergies  Allergen Reactions  . Penicillins Anaphylaxis    Social History:   reports that she has been smoking Cigarettes.  She has been smoking about .5 packs per day.  She has never used smokeless tobacco. She reports that she does not drink alcohol or use illicit drugs.   Family History  Problem Relation Age of Onset  . Hypertension Mother      Physical Exam:  GEN:  Pleasant 78 year old well nourished and well developed Caucasian Female examined  and in no acute distress; cooperative with exam Filed Vitals:  06/08/12 2136 06/08/12 2217 06/09/12 0029 06/09/12  0032 BP: 210/76 198/82  196/81 Pulse: 67   63 Temp: 99.3 F (37.4 C)   98.8 F (37.1 C) TempSrc: Rectal   Oral Resp: 18   18 Height:   4\' 11"  (1.499 m)  Weight:   65.091 kg (143 lb 8 oz)  SpO2: 97%   95%  Blood pressure 196/81, pulse 63, temperature 98.8 F (37.1 C), temperature source Oral, resp. rate 18, height 4\' 11"  (1.499 m), weight 65.091 kg (143 lb 8 oz), SpO2 95.00%. PSYCH: SHe is alert and oriented x3; does not appear anxious does not appear depressed; affect is normal HEENT: Normocephalic and Atraumatic, Mucous membranes pink; PERRLA; EOM intact; Fundi:  Benign;  No scleral icterus, Nares: Patent, Oropharynx: Clear, Poor Sparse Dentition, Neck:  FROM, no cervical lymphadenopathy nor thyromegaly or carotid bruit; no JVD; Breasts:: Not examined CHEST WALL: No tenderness CHEST: Normal respiration, clear to auscultation bilaterally HEART: Regular rate and rhythm; no murmurs rubs or gallops BACK: No kyphosis or scoliosis; no CVA tenderness ABDOMEN: Positive Bowel Sounds,  Obese, soft non-tender; no masses, no organomegaly, no pannus; no intertriginous candida. Rectal Exam: Not done EXTREMITIES: No bone or joint deformity; age-appropriate arthropathy of the hands and knees; no cyanosis, clubbing or edema; no ulcerations. Genitalia: not examined PULSES: 2+ and symmetric SKIN: Normal hydration no rash or ulceration CNS: Cranial nerves 2-12 grossly intact no focal neurologic deficit    Labs on Admission:  Basic Metabolic Panel:  Lab 06/08/12 1610 06/06/12 2105 06/03/12 0210 06/02/12 1225 06/02/12 1224  NA 138 134* 136 136 --  K 3.3* 3.1* 3.5 3.8 --  CL 101 97 101 99 --  CO2 23 21 28 26  --  GLUCOSE 137* 125* 124* 139* --  BUN 21 24* 19 17 --  CREATININE 0.89 1.06 0.92 0.91 --  CALCIUM 9.7 9.5 8.9 9.3 --  MG -- -- -- -- 2.2  PHOS -- -- -- -- 2.5   Liver Function Tests:  Lab 06/06/12 2105  AST 30  ALT 31  ALKPHOS 93  BILITOT 0.4  PROT 7.3  ALBUMIN 3.9   No results  found for this basename: LIPASE:5,AMYLASE:5 in the last 168 hours No results found for this basename: AMMONIA:5 in the last 168 hours CBC:  Lab 06/08/12 2017 06/06/12 2105 06/03/12 0210 06/02/12 1225  WBC 20.5* 12.9* 10.4 10.3  NEUTROABS 17.0* -- -- --  HGB 14.1 14.2 12.0 13.6  HCT 40.5 40.5 35.5* 39.5  MCV 82.2 81.8 84.3 84.0  PLT 293 325 229 287   Cardiac Enzymes:  Lab 06/03/12 0209 06/02/12 2057 06/02/12 1506  CKTOTAL -- -- --  CKMB -- -- --  CKMBINDEX -- -- --  TROPONINI <0.30 <0.30 <0.30    BNP (last 3 results) No results found for this basename: PROBNP:3 in the last 8760 hours CBG:  Lab 06/03/12 1233 06/03/12 0810 06/02/12 2157 06/02/12 1628  GLUCAP 105* 201* 120* 131*    Radiological Exams on Admission: Dg Chest 2 View  06/08/2012  *RADIOLOGY REPORT*  Clinical Data: 78 year old female with chest pain.  CHEST -  2 VIEW  Comparison: 06/06/2012  Findings: Upper limits normal heart size noted. The lungs are clear. There is no evidence of focal airspace disease, pulmonary edema, suspicious pulmonary nodule/mass, pleural effusion, or pneumothorax. No acute bony abnormalities are identified.  IMPRESSION: No evidence of acute cardiopulmonary disease.   Original Report Authenticated By: Harmon Pier, M.D.     EKG: Independently reviewed. Sinus Rhythm with a LBBB.    Assessment: Principal Problem:  *Altered mental status Active Problems:  Depression  Urinary tract infection  Hypokalemia  Leucocytosis  DIABETES MELLITUS, TYPE II  HYPERLIPIDEMIA  CORONARY ARTERY DISEASE  Hypertension    Plan:   Admit to Med/Surg Bed Once lab abnormalities are corrected may be medically clear for admission to [facility] (?[place]) 1:  For UTI:  Urine sent for Culture, IV Levaquin started.   2.  Hypokalemia:  Replete K+, check Magnesium  3.  Monitor WBCs (Leukocytosis) 4.  Reconcile Home Medications 5.  FOR DM2:  SSI coverage ordered PRN 6.  For Hypertension: PRN IV hydralazine 7.   DVT prophylaxis with Lovenox.   8.  Psych evaluation while Medical Inpatient    Code Status:   FULL CODE     Family Communication:       Disposition Plan:  TBA  Time spent:  60 Minutes  Ron Parker [facility] Pager [phone]  If 7PM-7AM, please contact night-coverage [email or web address] Password [facility] 06/09/2012, 12:37 AM

## 2012-06-09 NOTE — Progress Notes (Signed)
[facility] PROGRESS NOTE  Glenda GRUENEWALD UJW:[id number] DOB: [birth year] DOA: 06/08/2012 PCP: Rogelia Boga, MD  Assessment/Plan: Principal Problem:  *Altered mental status Active Problems:  DIABETES MELLITUS, TYPE II: Blood pressure stable  HYPERLIPIDEMIA  CORONARY ARTERY DISEASE: Stable, will check troponin and BNP just to ensure that leukocytosis is not from this  Depression: Involuntarily committed for mental health  Urinary tract infection: Do not think that this is urinary tract infection  Hypokalemia replaced  Hypertension: Still elevated, but improved from previous day.  Leucocytosis: Unclear etiology. Could possibly be stress margination secondary to elevated blood pressure. Chest x-ray and urine not very impressive, however after one dose of antibiotics, white blood cell count has improved. Continue for now.   Code Status: Full code Family Communication: Left message with daughter  Disposition Plan:  Behavioral when medically clearedhealth facility   Consultants:  Psychiatry has been consulted  Procedures:  None  Antibiotics:  IV Levaquin day 2  HPI/Subjective: Patient herself no complaints. No chest pain or shortness of breath. No headache.  Objective: Filed Vitals:   06/09/12 0438 06/09/12 0622 06/09/12 1000 06/09/12 1030  BP: 127/69 186/70 168/72 159/71  Pulse: 63 64 65 65  Temp: 98 F (36.7 C) 98 F (36.7 C) 98.6 F (37 C) 97.7 F (36.5 C)  TempSrc: Oral Oral Oral Oral  Resp: 18 18 18 18   Height:      Weight:      SpO2: 100% 97% 96% 97%    Intake/Output Summary (Last 24 hours) at 06/09/12 1215 Last data filed at 06/09/12 1026  Gross per 24 hour  Intake   1780 ml  Output   1050 ml  Net    730 ml   Filed Weights   06/09/12 0029  Weight: 65.091 kg (143 lb 8 oz)    Exam:   General:  Alert and oriented x3, no acute distress  Cardiovascular: Regular rate and rhythm, S1-S2  Respiratory: Clear to auscultation  bilaterally  Abdomen: Soft, nontender, nondistended, positive bowel sounds  Extremities: No clubbing or cyanosis, trace pitting edema  Data Reviewed: Basic Metabolic Panel:  Lab 06/09/12 5621 06/08/12 2017 06/06/12 2105 06/03/12 0210 06/02/12 1225 06/02/12 1224  NA 136 138 134* 136 136 --  K 3.7 3.3* 3.1* 3.5 3.8 --  CL 100 101 97 101 99 --  CO2 23 23 21 28 26  --  GLUCOSE 129* 137* 125* 124* 139* --  BUN 20 21 24* 19 17 --  CREATININE 0.86 0.89 1.06 0.92 0.91 --  CALCIUM 9.5 9.7 9.5 8.9 9.3 --  MG 2.0 -- -- -- -- 2.2  PHOS -- -- -- -- -- 2.5   Liver Function Tests:  Lab 06/06/12 2105  AST 30  ALT 31  ALKPHOS 93  BILITOT 0.4  PROT 7.3  ALBUMIN 3.9   CBC:  Lab 06/09/12 0447 06/08/12 2017 06/06/12 2105 06/03/12 0210 06/02/12 1225  WBC 15.6* 20.5* 12.9* 10.4 10.3  NEUTROABS -- 17.0* -- -- --  HGB 13.5 14.1 14.2 12.0 13.6  HCT 38.9 40.5 40.5 35.5* 39.5  MCV 82.8 82.2 81.8 84.3 84.0  PLT 290 293 325 229 287   Cardiac Enzymes:  Lab 06/03/12 0209 06/02/12 2057 06/02/12 1506  CKTOTAL -- -- --  CKMB -- -- --  CKMBINDEX -- -- --  TROPONINI <0.30 <0.30 <0.30   BNP (last 3 results) No results found for this basename: PROBNP:3 in the last 8760 hours CBG:  Lab 06/09/12 0747 06/09/12 0049 06/03/12 1233 06/03/12  0981 06/02/12 2157  GLUCAP 120* 130* 105* 201* 120*    No results found for this or any previous visit (from the past 240 hour(s)).   Studies: Dg Chest 2 View  06/08/2012   IMPRESSION: No evidence of acute cardiopulmonary disease.   Original Report Authenticated By: Harmon Pier, M.D.     Scheduled Meds:   . [COMPLETED] sodium chloride   Intravenous STAT  . aspirin  81 mg Oral Daily  . darifenacin  15 mg Oral Daily  . donepezil  5 mg Oral QHS  . DULoxetine  60 mg Oral Daily  . enoxaparin (LOVENOX) injection  40 mg Subcutaneous Q24H  . felodipine  10 mg Oral Daily  . lisinopril  20 mg Oral Daily   And  . hydrochlorothiazide  25 mg Oral Daily  . insulin  aspart  0-5 Units Subcutaneous QHS  . insulin aspart  0-9 Units Subcutaneous TID WC  . [COMPLETED] levofloxacin (LEVAQUIN) IV  500 mg Intravenous Once  . levofloxacin (LEVAQUIN) IV  500 mg Intravenous Q24H  . meloxicam  15 mg Oral Daily  . metFORMIN  500 mg Oral Q breakfast  . metoprolol succinate  100 mg Oral Daily  . [COMPLETED] potassium chloride  40 mEq Oral Once  . [COMPLETED] potassium chloride  40 mEq Oral Once  . simvastatin  20 mg Oral Daily  . sodium chloride  3 mL Intravenous Q12H  . [DISCONTINUED] enoxaparin (LOVENOX) injection  40 mg Subcutaneous Q24H  . [DISCONTINUED] lisinopril-hydrochlorothiazide  1 tablet Oral Daily   Continuous Infusions:   . sodium chloride 75 mL/hr at 06/09/12 0032    Principal Problem:  *Altered mental status Active Problems:  DIABETES MELLITUS, TYPE II  HYPERLIPIDEMIA  CORONARY ARTERY DISEASE  Depression  Urinary tract infection  Hypokalemia  Hypertension  Leucocytosis    Time spent: 25 minutes    Hollice Espy  [facility] Pager [phone]. If 8PM-8AM, please contact night-coverage at [email or web address], password [facility] 06/09/2012, 12:15 PM  LOS: 1 day

## 2012-06-09 NOTE — ED Notes (Signed)
Admitting MD at bedside.

## 2012-06-10 DIAGNOSIS — I5031 Acute diastolic (congestive) heart failure: Principal | ICD-10-CM

## 2012-06-10 DIAGNOSIS — I509 Heart failure, unspecified: Secondary | ICD-10-CM

## 2012-06-10 LAB — URINE CULTURE
Colony Count: NO GROWTH
Culture: NO GROWTH

## 2012-06-10 LAB — BASIC METABOLIC PANEL
BUN: 21 mg/dL (ref 6–23)
Calcium: 9.4 mg/dL (ref 8.4–10.5)
GFR calc Af Amer: 71 mL/min — ABNORMAL LOW (ref >90)
GFR calc non Af Amer: 61 mL/min — ABNORMAL LOW (ref >90)
Glucose, Bld: 127 mg/dL — ABNORMAL HIGH (ref 70–99)
Potassium: 3.5 mEq/L (ref 3.5–5.1)
Sodium: 135 mEq/L (ref 135–145)

## 2012-06-10 LAB — GLUCOSE, CAPILLARY
Glucose-Capillary: 141 mg/dL — ABNORMAL HIGH (ref 70–99)
Glucose-Capillary: 104 mg/dL — ABNORMAL HIGH (ref 70–99)
Glucose-Capillary: 123 mg/dL — ABNORMAL HIGH (ref 70–99)

## 2012-06-10 LAB — CBC
HCT: 40.8 % (ref 36.0–46.0)
MCH: 28.6 pg (ref 26.0–34.0)
MCHC: 34.3 g/dL (ref 30.0–36.0)
RDW: 14.2 % (ref 11.5–15.5)

## 2012-06-10 MED ORDER — LEVOFLOXACIN 250 MG PO TABS
250.0000 mg | ORAL_TABLET | Freq: Every day | ORAL | Status: DC
  Administered 2012-06-11: 250 mg via ORAL
  Filled 2012-06-10: qty 1

## 2012-06-10 MED ORDER — LORAZEPAM 0.5 MG PO TABS
0.5000 mg | ORAL_TABLET | ORAL | Status: DC | PRN
  Administered 2012-06-10: 0.5 mg via ORAL
  Filled 2012-06-10: qty 1

## 2012-06-10 MED ORDER — FUROSEMIDE 40 MG PO TABS
40.0000 mg | ORAL_TABLET | Freq: Two times a day (BID) | ORAL | Status: DC
  Administered 2012-06-10 – 2012-06-11 (×2): 40 mg via ORAL
  Filled 2012-06-10 (×4): qty 1

## 2012-06-10 MED ORDER — NICOTINE 7 MG/24HR TD PT24
7.0000 mg | MEDICATED_PATCH | Freq: Every day | TRANSDERMAL | Status: DC
  Administered 2012-06-10 – 2012-06-15 (×6): 7 mg via TRANSDERMAL
  Filled 2012-06-10 (×6): qty 1

## 2012-06-10 MED ORDER — LEVOFLOXACIN 500 MG PO TABS
500.0000 mg | ORAL_TABLET | Freq: Every day | ORAL | Status: DC
  Administered 2012-06-10: 500 mg via ORAL
  Filled 2012-06-10: qty 1

## 2012-06-10 MED ORDER — CLONIDINE HCL 0.1 MG PO TABS
0.1000 mg | ORAL_TABLET | Freq: Two times a day (BID) | ORAL | Status: DC
  Administered 2012-06-10 – 2012-06-15 (×11): 0.1 mg via ORAL
  Filled 2012-06-10 (×12): qty 1

## 2012-06-10 NOTE — Consult Note (Signed)
Reason for Consult: dep, agiation Referring Physician: unknown  Glenda Underwood is an 78 y.o. female.  HPI:    Glenda Underwood is a 78 y.o. female who was sent to the ED by [facility] due to multiple abnormalities on her laboratory studies. She was to be admitted to the [facility].    Seen today. Very poor historian. Showed me bag and reported she is ready to be discharged now.  Patient reports having severe depression.  She also reports having problems with her concentration and memory for some time now.    appeared to be confused. She is not sure where she is now and also why she is here. Thinks it is an office. Not sure if she has medical problems now. Not sure if she needs help now. Thinks she needs help at home for her daily needs. Pt denies any psychotic symptoms now including any paranoid thoughts.   Past Medical History  Diagnosis Date  . CORONARY ARTERY DISEASE 07/20/2010  . DEPRESSION 07/20/2010  . DIABETES MELLITUS, TYPE II 07/20/2010  . GERD 07/20/2010  . HYPERLIPIDEMIA 07/20/2010  . HYPERTENSION 07/20/2010  . LOW BACK PAIN 07/20/2010  . OSTEOARTHRITIS 07/20/2010  . URINARY INCONTINENCE 07/20/2010  . TIA (transient ischemic attack)     Past Surgical History  Procedure Date  . Abdominal hysterectomy   . Vitrectomy   . Total knee arthroplasty   . Coronary stent placement     Family History  Problem Relation Age of Onset  . Hypertension Mother     Social History:  reports that she has been smoking Cigarettes.  She has been smoking about .5 packs per day. She has never used smokeless tobacco. She reports that she does not drink alcohol or use illicit drugs.  Allergies:  Allergies  Allergen Reactions  . Penicillins Anaphylaxis    Medications: I have reviewed the patient's current medications.  Results for orders placed during the hospital encounter of 06/08/12 (from the past 48 hour(s))  URINE CULTURE     Status:  Normal   Collection Time   06/08/12  9:57 PM      Component Value Range Comment   Specimen Description URINE, CLEAN CATCH      Special Requests NONE      Culture  Setup Time 06/09/2012 06:31      Colony Count NO GROWTH      Culture NO GROWTH      Report Status 06/10/2012 FINAL     GLUCOSE, CAPILLARY     Status: Abnormal   Collection Time   06/09/12 12:49 AM      Component Value Range Comment   Glucose-Capillary 130 (*) 70 - 99 mg/dL   BASIC METABOLIC PANEL     Status: Abnormal   Collection Time   06/09/12  4:47 AM      Component Value Range Comment   Sodium 136  135 - 145 mEq/L    Potassium 3.7  3.5 - 5.1 mEq/L    Chloride 100  96 - 112 mEq/L    CO2 23  19 - 32 mEq/L    Glucose, Bld 129 (*) 70 - 99 mg/dL    BUN 20  6 - 23 mg/dL    Creatinine, Ser 4.09  0.50 - 1.10 mg/dL    Calcium 9.5  8.4 - 81.1 mg/dL    GFR calc non Af Amer 64 (*) >90 mL/min    GFR calc Af Amer 74 (*) >90  mL/min   CBC     Status: Abnormal   Collection Time   06/09/12  4:47 AM      Component Value Range Comment   WBC 15.6 (*) 4.0 - 10.5 K/uL    RBC 4.70  3.87 - 5.11 MIL/uL    Hemoglobin 13.5  12.0 - 15.0 g/dL    HCT 29.5  62.1 - 30.8 %    MCV 82.8  78.0 - 100.0 fL    MCH 28.7  26.0 - 34.0 pg    MCHC 34.7  30.0 - 36.0 g/dL    RDW 65.7  84.6 - 96.2 %    Platelets 290  150 - 400 K/uL   HEMOGLOBIN A1C     Status: Abnormal   Collection Time   06/09/12  4:47 AM      Component Value Range Comment   Hemoglobin A1C 6.3 (*) <5.7 %    Mean Plasma Glucose 134 (*) <117 mg/dL   MAGNESIUM     Status: Normal   Collection Time   06/09/12  4:47 AM      Component Value Range Comment   Magnesium 2.0  1.5 - 2.5 mg/dL   GLUCOSE, CAPILLARY     Status: Abnormal   Collection Time   06/09/12  7:47 AM      Component Value Range Comment   Glucose-Capillary 120 (*) 70 - 99 mg/dL   GLUCOSE, CAPILLARY     Status: Abnormal   Collection Time   06/09/12 12:28 PM      Component Value Range Comment   Glucose-Capillary 111 (*) 70 -  99 mg/dL   PRO B NATRIURETIC PEPTIDE     Status: Abnormal   Collection Time   06/09/12 12:56 PM      Component Value Range Comment   Pro B Natriuretic peptide (BNP) 1017.0 (*) 0 - 450 pg/mL   TROPONIN I     Status: Normal   Collection Time   06/09/12 12:56 PM      Component Value Range Comment   Troponin I <0.30  <0.30 ng/mL   GLUCOSE, CAPILLARY     Status: Abnormal   Collection Time   06/09/12  4:49 PM      Component Value Range Comment   Glucose-Capillary 128 (*) 70 - 99 mg/dL   GLUCOSE, CAPILLARY     Status: Abnormal   Collection Time   06/09/12  9:53 PM      Component Value Range Comment   Glucose-Capillary 132 (*) 70 - 99 mg/dL   CBC     Status: Normal   Collection Time   06/10/12  5:35 AM      Component Value Range Comment   WBC 10.2  4.0 - 10.5 K/uL    RBC 4.89  3.87 - 5.11 MIL/uL    Hemoglobin 14.0  12.0 - 15.0 g/dL    HCT 95.2  84.1 - 32.4 %    MCV 83.4  78.0 - 100.0 fL    MCH 28.6  26.0 - 34.0 pg    MCHC 34.3  30.0 - 36.0 g/dL    RDW 40.1  02.7 - 25.3 %    Platelets 287  150 - 400 K/uL   BASIC METABOLIC PANEL     Status: Abnormal   Collection Time   06/10/12  5:35 AM      Component Value Range Comment   Sodium 135  135 - 145 mEq/L    Potassium 3.5  3.5 - 5.1 mEq/L  Chloride 100  96 - 112 mEq/L    CO2 23  19 - 32 mEq/L    Glucose, Bld 127 (*) 70 - 99 mg/dL    BUN 21  6 - 23 mg/dL    Creatinine, Ser 1.61  0.50 - 1.10 mg/dL    Calcium 9.4  8.4 - 09.6 mg/dL    GFR calc non Af Amer 61 (*) >90 mL/min    GFR calc Af Amer 71 (*) >90 mL/min   GLUCOSE, CAPILLARY     Status: Abnormal   Collection Time   06/10/12  8:04 AM      Component Value Range Comment   Glucose-Capillary 141 (*) 70 - 99 mg/dL   GLUCOSE, CAPILLARY     Status: Abnormal   Collection Time   06/10/12  4:30 PM      Component Value Range Comment   Glucose-Capillary 104 (*) 70 - 99 mg/dL   GLUCOSE, CAPILLARY     Status: Abnormal   Collection Time   06/10/12  9:05 PM      Component Value Range Comment    Glucose-Capillary 123 (*) 70 - 99 mg/dL    Comment 1 Notify RN       Dg Chest 2 View  06/08/2012  *RADIOLOGY REPORT*  Clinical Data: 78 year old female with chest pain.  CHEST - 2 VIEW  Comparison: 06/06/2012  Findings: Upper limits normal heart size noted. The lungs are clear. There is no evidence of focal airspace disease, pulmonary edema, suspicious pulmonary nodule/mass, pleural effusion, or pneumothorax. No acute bony abnormalities are identified.  IMPRESSION: No evidence of acute cardiopulmonary disease.   Original Report Authenticated By: Harmon Pier, M.D.     Review of Systems  Gastrointestinal: Positive for constipation.   Blood pressure 167/65, pulse 64, temperature 98.5 F (36.9 C), temperature source Oral, resp. rate 18, height 4\' 11"  (1.499 m), weight 61.417 kg (135 lb 6.4 oz), SpO2 97.00%. Physical Exam  Mental Status Examination/Evaluation:   Appearance: on bed confused   Eye Contact::  poor   Speech: limited   Volume: low   Mood ok   Affect: ristricted   Thought Process: disorganized   Orientation: 1/4 only person   Thought Content: denies AVH   Suicidal Thoughts: No   Homicidal Thoughts: no   Memory: Recent; Poor   Judgement: Impaired   Insight: Lacking   Psychomotor Activity: slow   Concentration: poor   Recall: poor   Akathisia: No   Assessment:   AXIS I: dementia, hx of depression AXIS II: Deferred   AXIS III: see emdical hx ?     AXIS IV: problems related to social environment, problems with access to health care services and problems with primary support group   AXIS V: 25   ?   Treatment Plan/Recommendations:   -  Cognitive deficits are appeared to be be her main issues right now and she will benefit from psy out pt care and SNF after discharge. Psy SW can help   - risperidone 0.25- 0.5 mg QHS can used for agiatation at this time   - will follow as needed    Wonda Cerise 06/10/2012, 9:23  PM

## 2012-06-10 NOTE — Progress Notes (Signed)
[facility] PROGRESS NOTE  Glenda Underwood ZOX:[id number] DOB: [birth year] DOA: 06/08/2012 PCP: Rogelia Boga, MD  Assessment/Plan: Principal Problem:  *Altered mental status: Patient more agitated today. Have started when necessary Ativan. I'm also transferring to [facility] psychiatric and requested her, although one may not be available. Have also consulted psychiatry to come see. Continue Risperdal Active Problems: Acute diastolic heart failure: Suspect this may be an underlying cause in patients malignant blood pressures. BNP moderately elevated at 1017. Troponin was normal. Started Lasix and patient diuresed over 2 L yesterday. We'll order 2-D echo and continue diuresis.   DIABETES MELLITUS, TYPE II: Blood sugars stable-120s to 140s. Discontinue metformin because of congestive heart failure diagnosis. Her A1c is only 6.3.   HYPERLIPIDEMIA: Stable medical issues  CORONARY ARTERY DISEASE: Stable, will check troponin and BNP just to ensure that leukocytosis is not from this  Depression: Involuntarily committed for mental health, see above  Urinary tract infection: Do not think that this is urinary tract infection  Hypokalemia replaced  Hypertension: Still elevated, but improved from previous day.  Leucocytosis: Unclear etiology, now resolved. Could possibly be stress margination secondary to elevated blood pressure. Chest x-ray and urine not very impressive, however after one dose of antibiotics, white blood cell count has improved. Continue for now.   Code Status: Full code Family Communication: Left message with daughter  Disposition Plan:  Behavioral when medically clearedhealth facility   Consultants:  Psychiatry has been consulted  Procedures:  None  Antibiotics:  Levaquin day 3  HPI/Subjective: Patient herself no complaints. No chest pain or shortness of breath. No headache, for me. However several hours later, she started becoming more agitated and wanting to  wander off.  Objective: Filed Vitals:   06/09/12 2145 06/10/12 0506 06/10/12 0631 06/10/12 1000  BP: 196/75 203/75  158/55  Pulse: 68 60  65  Temp: 98.2 F (36.8 C) 98.4 F (36.9 C)  98 F (36.7 C)  TempSrc: Oral Oral  Oral  Resp: 18 18  18   Height:      Weight:   61.417 kg (135 lb 6.4 oz)   SpO2: 96% 97%  98%    Intake/Output Summary (Last 24 hours) at 06/10/12 1345 Last data filed at 06/10/12 0200  Gross per 24 hour  Intake    240 ml  Output   1000 ml  Net   -760 ml   Filed Weights   06/09/12 0029 06/10/12 0631  Weight: 65.091 kg (143 lb 8 oz) 61.417 kg (135 lb 6.4 oz)    Exam:   General:  Alert and oriented x3, no acute distress  Cardiovascular: Regular rate and rhythm, S1-S2  Respiratory: Clear to auscultation bilaterally  Abdomen: Soft, nontender, nondistended, positive bowel sounds  Extremities: No clubbing or cyanosis, trace pitting edema  Data Reviewed: Basic Metabolic Panel:  Lab 06/10/12 8119 06/09/12 0447 06/08/12 2017 06/06/12 2105  NA 135 136 138 134*  K 3.5 3.7 3.3* 3.1*  CL 100 100 101 97  CO2 23 23 23 21   GLUCOSE 127* 129* 137* 125*  BUN 21 20 21  24*  CREATININE 0.89 0.86 0.89 1.06  CALCIUM 9.4 9.5 9.7 9.5  MG -- 2.0 -- --  PHOS -- -- -- --   Liver Function Tests:  Lab 06/06/12 2105  AST 30  ALT 31  ALKPHOS 93  BILITOT 0.4  PROT 7.3  ALBUMIN 3.9   CBC:  Lab 06/10/12 0535 06/09/12 0447 06/08/12 2017 06/06/12 2105  WBC 10.2 15.6* 20.5*  12.9*  NEUTROABS -- -- 17.0* --  HGB 14.0 13.5 14.1 14.2  HCT 40.8 38.9 40.5 40.5  MCV 83.4 82.8 82.2 81.8  PLT 287 290 293 325   Cardiac Enzymes:  Lab 06/09/12 1256  CKTOTAL --  CKMB --  CKMBINDEX --  TROPONINI <0.30   BNP (last 3 results)  Basename 06/09/12 1256  PROBNP 1017.0*   CBG:  Lab 06/10/12 0804 06/09/12 2153 06/09/12 1649 06/09/12 1228 06/09/12 0747  GLUCAP 141* 132* 128* 111* 120*    Recent Results (from the past 240 hour(s))  URINE CULTURE     Status: Normal    Collection Time   06/08/12  9:57 PM      Component Value Range Status Comment   Specimen Description URINE, CLEAN CATCH   Final    Special Requests NONE   Final    Culture  Setup Time 06/09/2012 06:31   Final    Colony Count NO GROWTH   Final    Culture NO GROWTH   Final    Report Status 06/10/2012 FINAL   Final      Studies: Dg Chest 2 View  06/08/2012   IMPRESSION: No evidence of acute cardiopulmonary disease.   Original Report Authenticated By: Harmon Pier, M.D.     Scheduled Meds:    . aspirin  81 mg Oral Daily  . darifenacin  15 mg Oral Daily  . donepezil  5 mg Oral QHS  . DULoxetine  60 mg Oral Daily  . enoxaparin (LOVENOX) injection  40 mg Subcutaneous Q24H  . felodipine  10 mg Oral Daily  . furosemide  40 mg Oral BID  . lisinopril  20 mg Oral Daily   And  . hydrochlorothiazide  25 mg Oral Daily  . insulin aspart  0-5 Units Subcutaneous QHS  . insulin aspart  0-9 Units Subcutaneous TID WC  . levofloxacin  500 mg Oral Daily  . meloxicam  15 mg Oral Daily  . metFORMIN  500 mg Oral Q breakfast  . metoprolol succinate  100 mg Oral Daily  . simvastatin  20 mg Oral Daily  . [DISCONTINUED] furosemide  20 mg Intravenous Q12H  . [DISCONTINUED] levofloxacin (LEVAQUIN) IV  500 mg Intravenous Q24H   Continuous Infusions:   Principal Problem:  *Altered mental status Active Problems:  DIABETES MELLITUS, TYPE II  HYPERLIPIDEMIA  CORONARY ARTERY DISEASE  Depression  Urinary tract infection  Hypokalemia  Hypertension  Leucocytosis    Time spent: 35 minutes    Hollice Espy  [facility] Pager [phone]. If 8PM-8AM, please contact night-coverage at [email or web address], password [facility] 06/10/2012, 1:45 PM  LOS: 2 days

## 2012-06-10 NOTE — Progress Notes (Addendum)
DURING THE NIGHT PT REMOVED HER SL.  CAN WE CHANGE THE IV  LEVAQUIN TO PO?  CAN WE CHANGE LASIX TO PO ALSO?

## 2012-06-11 DIAGNOSIS — F0390 Unspecified dementia without behavioral disturbance: Secondary | ICD-10-CM

## 2012-06-11 LAB — BASIC METABOLIC PANEL
BUN: 28 mg/dL — ABNORMAL HIGH (ref 6–23)
Chloride: 95 mEq/L — ABNORMAL LOW (ref 96–112)
GFR calc Af Amer: 52 mL/min — ABNORMAL LOW (ref >90)
GFR calc non Af Amer: 45 mL/min — ABNORMAL LOW (ref >90)
Glucose, Bld: 125 mg/dL — ABNORMAL HIGH (ref 70–99)
Potassium: 3 mEq/L — ABNORMAL LOW (ref 3.5–5.1)
Sodium: 134 mEq/L — ABNORMAL LOW (ref 135–145)

## 2012-06-11 LAB — GLUCOSE, CAPILLARY
Glucose-Capillary: 125 mg/dL — ABNORMAL HIGH (ref 70–99)
Glucose-Capillary: 154 mg/dL — ABNORMAL HIGH (ref 70–99)

## 2012-06-11 LAB — CBC
HCT: 36.9 % (ref 36.0–46.0)
Hemoglobin: 12.5 g/dL (ref 12.0–15.0)
MCHC: 33.9 g/dL (ref 30.0–36.0)
RBC: 4.47 MIL/uL (ref 3.87–5.11)

## 2012-06-11 MED ORDER — POTASSIUM CHLORIDE CRYS ER 20 MEQ PO TBCR
40.0000 meq | EXTENDED_RELEASE_TABLET | Freq: Once | ORAL | Status: AC
  Administered 2012-06-11: 40 meq via ORAL
  Filled 2012-06-11: qty 2

## 2012-06-11 NOTE — Progress Notes (Signed)
  Echocardiogram 2D Echocardiogram has been performed.  Cathie Beams 06/11/2012, 12:17 PM

## 2012-06-11 NOTE — Care Management Note (Signed)
    Page 1 of 1   06/11/2012     1:43:49 PM   CARE MANAGEMENT NOTE 06/11/2012  Patient:  Glenda Underwood, Glenda Underwood   Account Number:  [id number]  Date Initiated:  06/11/2012  Documentation initiated by:  Lorenda Ishihara  Subjective/Objective Assessment:   78 yo female admitted with AMS, UTI, CHF. PTA was at [facility].     Action/Plan:   Return to [facility] vs [facility]   Anticipated DC Date:  06/12/2012   Anticipated DC Plan:  PSYCHIATRIC HOSPITAL  In-house referral  Clinical Social Worker      DC Planning Services  CM consult      Choice offered to / List presented to:             Status of service:  Completed, signed off Medicare Important Message given?  NO (If response is "NO", the following Medicare IM given date fields will be blank) Date Medicare IM given:   Date Additional Medicare IM given:    Discharge Disposition:  PSYCHIATRIC HOSPITAL  Per UR Regulation:  Reviewed for med. necessity/level of care/duration of stay  If discussed at Long Length of Stay Meetings, dates discussed:    Comments:

## 2012-06-11 NOTE — Clinical Documentation Improvement (Signed)
CHANGE MENTAL STATUS DOCUMENTATION CLARIFICATION   THIS DOCUMENT IS NOT A PERMANENT PART OF THE MEDICAL RECORD  TO RESPOND TO THE THIS QUERY, FOLLOW THE INSTRUCTIONS BELOW:  1. If needed, update documentation for the patient's encounter via the notes activity.  2. Access this query again and click edit on the In [employer].  3. After updating, or not, click F2 to complete all highlighted (required) fields concerning your review. Select "additional documentation in the medical record" OR "no additional documentation provided".  4. Click Sign note button.  5. The deficiency will fall out of your In Basket *Please let us know if you are not able to complete this workflow by phone or e-mail (listed below).         06/11/12  Dear Dr. Neysa Bonito  In an effort to better capture your patient's severity of illness, reflect appropriate length of stay and utilization of resources, a review of the patient medical record has revealed the following indicators.    Based on your clinical judgment, please clarify and document in a progress note and/or discharge summary the clinical condition associated with the following supporting information:  In responding to this query please exercise your independent judgment.  The fact that a query is asked, does not imply that any particular answer is desired or expected.   Possible Clinical Conditions?  _______Encephalopathy (describe type if known)                       Septic                       Alcoholic                        Hepatic                       Hypertensive                       Metabolic                       Toxic  _______Drug induced confusion/delirium _______Acute confusion _______Acute delirium _______Acute exacerbation of known dementia (indicate type) _______New diagnosis of Dementia, Alzheimer's, cerebral atherosclerosis _______Other Condition _______Cannot Clinically Determine    Supporting Information:  Risk  Factors: Sent to the [facility] ED from [facility] due to abnormalities in lab studies per 12/06 progress notes. Possible UTI per 12/06 progress notes.  Signs & Symptoms: Principle problem listed as AMS, with agitation and severe depression per 12/08 progress notes.   Reviewed: Additional documentation provided per 12/11 progress notes.  Thank You,  Marciano Sequin,  Clinical Documentation Specialist:  Pager: [phone]  Phone: [phone]  Health Information Management [place]

## 2012-06-11 NOTE — Social Work (Signed)
Clinical Social Work Department BRIEF PSYCHOSOCIAL ASSESSMENT 06/11/2012  Patient:  Glenda Underwood, Glenda Underwood     Account Number:  [id number]     Admit date:  06/08/2012  Clinical Social Worker:  Eddie Candle  Date/Time:  06/11/2012 04:00 PM  Referred by:  Physician  Date Referred:  06/11/2012 Referred for  SNF Placement   Other Referral:   Interview type:  Patient Other interview type:    PSYCHOSOCIAL DATA Living Status:  FAMILY Admitted from facility:   Level of care:   Primary support name:  Glenda Underwood Primary support relationship to patient:  CHILD, ADULT Degree of support available:   Good [phone]    CURRENT CONCERNS Current Concerns  Behavioral Health Issues   Other Concerns:    SOCIAL WORK ASSESSMENT / PLAN Patient has mental confusion and is currently assigned a sitter but has been cleared by psych of suicidal/homicidal ideation. Needs snf placement for follow up care.   Assessment/plan status:  Information/Referral to [facility] Other assessment/ plan:   Information/referral to community resources:   Refer to local SNFs for placement.    PATIENT'S/FAMILY'S RESPONSE TO PLAN OF CARE: Patient unclear, daughter needs to be contacted for further assessment.

## 2012-06-11 NOTE — Progress Notes (Signed)
Notified MD Rito Ehrlich that patient is complaining of having leg spasms, MD will order additional potassium supplements Means, Myrtie Hawk RN 06-11-2012 16:15pm

## 2012-06-11 NOTE — Progress Notes (Addendum)
[facility] PROGRESS NOTE  Glenda Underwood ZOX:[id number] DOB: 1932/11/02 DOA: 06/08/2012 PCP: Rogelia Boga, MD  Assessment/Plan: Principal Problem:  *Altered mental status: Patient better today from a mood standpoint appreciate psychiatry followup. Corporate investment banker. Have started when necessary Ativan. . Continue Risperdal Active Problems: Acute diastolic heart failure: Suspect this may be an underlying cause in patients malignant blood pressures. BNP moderately elevated at 1017. Troponin was normal. Started Lasix and patient diuresed over 2 L yesterday. Given that creatinine starting to slightly rise, and BNP down, we'll discontinue Lasix and continue her hydrochlorothiazide at her baseline. We'll order 2-D echo and continue diuresis.   DIABETES MELLITUS, TYPE II: Blood sugars stable-120s to 140s. Discontinue metformin because of congestive heart failure diagnosis. Her A1c is only 6.3.   HYPERLIPIDEMIA: Stable medical issues  CORONARY ARTERY DISEASE: Stable, will check troponin and BNP just to ensure that leukocytosis is not from this  Depression: Involuntarily committed for mental health, see above  Urinary tract infection: Do not think that this is urinary tract infection  Hypokalemia replaced  Hypertension: Still elevated, but improved from previous day.  Leucocytosis: Unclear etiology, now resolved. Could possibly be stress margination secondary to elevated blood pressure. Chest x-ray and urine not very impressive, however after one dose of antibiotics, white blood cell count has improved. Continue for now. At this point, will d/c abx.   Code Status: Full code Family Communication: Left message with daughter  Disposition Plan:  Needs to be transferred to psyche facility, likely ready from medicine standpoint by tomorrow   Consultants:  Psychiatry  Procedures:  None  Antibiotics:  Levaquin day 4-stopping today  HPI/Subjective: Patient herself no complaints. No  chest pain or shortness of breath. No headache, for me. Just feels worn out.  Objective: Filed Vitals:   06/11/12 0422 06/11/12 0930 06/11/12 1000 06/11/12 1155  BP: 151/67 130/67 146/72   Pulse: 62 59 59 59  Temp: 98 F (36.7 C) 98.2 F (36.8 C) 97.9 F (36.6 C)   TempSrc: Oral Oral Oral   Resp: 18  18   Height:      Weight:      SpO2: 96%  94%     Intake/Output Summary (Last 24 hours) at 06/11/12 1215 Last data filed at 06/11/12 1042  Gross per 24 hour  Intake    720 ml  Output   1525 ml  Net   -805 ml   Filed Weights   06/09/12 0029 06/10/12 0631  Weight: 65.091 kg (143 lb 8 oz) 61.417 kg (135 lb 6.4 oz)    Exam:   General:  Alert and oriented x3, no acute distress  Cardiovascular: Regular rate and rhythm, S1-S2  Respiratory: Clear to auscultation bilaterally  Abdomen: Soft, nontender, nondistended, positive bowel sounds  Extremities: No clubbing or cyanosis, trace pitting edema  Data Reviewed: Basic Metabolic Panel:  Lab 06/11/12 8119 06/10/12 0535 06/09/12 0447 06/08/12 2017 06/06/12 2105  NA 134* 135 136 138 134*  K 3.0* 3.5 3.7 3.3* 3.1*  CL 95* 100 100 101 97  CO2 24 23 23 23 21   GLUCOSE 125* 127* 129* 137* 125*  BUN 28* 21 20 21  24*  CREATININE 1.14* 0.89 0.86 0.89 1.06  CALCIUM 9.3 9.4 9.5 9.7 9.5  MG -- -- 2.0 -- --  PHOS -- -- -- -- --   Liver Function Tests:  Lab 06/06/12 2105  AST 30  ALT 31  ALKPHOS 93  BILITOT 0.4  PROT 7.3  ALBUMIN 3.9  CBC:  Lab 06/11/12 0441 06/10/12 0535 06/09/12 0447 06/08/12 2017 06/06/12 2105  WBC 10.9* 10.2 15.6* 20.5* 12.9*  NEUTROABS -- -- -- 17.0* --  HGB 12.5 14.0 13.5 14.1 14.2  HCT 36.9 40.8 38.9 40.5 40.5  MCV 82.6 83.4 82.8 82.2 81.8  PLT 281 287 290 293 325   Cardiac Enzymes:  Lab 06/09/12 1256  CKTOTAL --  CKMB --  CKMBINDEX --  TROPONINI <0.30   BNP (last 3 results)  Basename 06/11/12 0441 06/09/12 1256  PROBNP 690.4* 1017.0*   CBG:  Lab 06/11/12 1122 06/11/12 0729  06/10/12 2105 06/10/12 1630 06/10/12 1201  GLUCAP 154* 125* 123* 104* 108*    Recent Results (from the past 240 hour(s))  URINE CULTURE     Status: Normal   Collection Time   06/08/12  9:57 PM      Component Value Range Status Comment   Specimen Description URINE, CLEAN CATCH   Final    Special Requests NONE   Final    Culture  Setup Time 06/09/2012 06:31   Final    Colony Count NO GROWTH   Final    Culture NO GROWTH   Final    Report Status 06/10/2012 FINAL   Final      Studies: Dg Chest 2 View  06/08/2012   IMPRESSION: No evidence of acute cardiopulmonary disease.   Original Report Authenticated By: Harmon Pier, M.D.     Scheduled Meds:    . aspirin  81 mg Oral Daily  . cloNIDine  0.1 mg Oral BID  . darifenacin  15 mg Oral Daily  . donepezil  5 mg Oral QHS  . DULoxetine  60 mg Oral Daily  . enoxaparin (LOVENOX) injection  40 mg Subcutaneous Q24H  . felodipine  10 mg Oral Daily  . lisinopril  20 mg Oral Daily   And  . hydrochlorothiazide  25 mg Oral Daily  . insulin aspart  0-5 Units Subcutaneous QHS  . insulin aspart  0-9 Units Subcutaneous TID WC  . levofloxacin  250 mg Oral Daily  . meloxicam  15 mg Oral Daily  . metoprolol succinate  100 mg Oral Daily  . nicotine  7 mg Transdermal Daily  . [COMPLETED] potassium chloride  40 mEq Oral Once  . simvastatin  20 mg Oral Daily  . [DISCONTINUED] furosemide  40 mg Oral BID  . [DISCONTINUED] levofloxacin  500 mg Oral Daily  . [DISCONTINUED] metFORMIN  500 mg Oral Q breakfast   Continuous Infusions:   Principal Problem:  *Acute diastolic CHF (congestive heart failure) Active Problems:  DIABETES MELLITUS, TYPE II  HYPERLIPIDEMIA  CORONARY ARTERY DISEASE  Altered mental status  Depression  Urinary tract infection  Hypokalemia  Hypertension  Leucocytosis    Time spent: 20 minutes    Hollice Espy  [facility] Pager [phone]. If 8PM-8AM, please contact night-coverage at [email or web address], password  [facility] 06/11/2012, 12:15 PM  LOS: 3 days

## 2012-06-12 ENCOUNTER — Encounter (HOSPITAL_COMMUNITY): Payer: Self-pay | Admitting: *Deleted

## 2012-06-12 ENCOUNTER — Ambulatory Visit: Payer: Medicare Other | Admitting: Internal Medicine

## 2012-06-12 LAB — BASIC METABOLIC PANEL
BUN: 34 mg/dL — ABNORMAL HIGH (ref 6–23)
CO2: 25 mEq/L (ref 19–32)
Chloride: 98 mEq/L (ref 96–112)
Creatinine, Ser: 1.19 mg/dL — ABNORMAL HIGH (ref 0.50–1.10)
GFR calc Af Amer: 50 mL/min — ABNORMAL LOW (ref >90)
Glucose, Bld: 123 mg/dL — ABNORMAL HIGH (ref 70–99)
Potassium: 3.8 mEq/L (ref 3.5–5.1)

## 2012-06-12 LAB — GLUCOSE, CAPILLARY
Glucose-Capillary: 122 mg/dL — ABNORMAL HIGH (ref 70–99)
Glucose-Capillary: 95 mg/dL (ref 70–99)
Glucose-Capillary: 107 mg/dL — ABNORMAL HIGH (ref 70–99)

## 2012-06-12 MED ORDER — NICOTINE 7 MG/24HR TD PT24: 1.0000 | MEDICATED_PATCH | Freq: Every day | TRANSDERMAL | Status: DC

## 2012-06-12 MED ORDER — CLONIDINE HCL 0.1 MG PO TABS: 0.1000 mg | ORAL_TABLET | Freq: Two times a day (BID) | ORAL | Status: DC

## 2012-06-12 NOTE — Evaluation (Signed)
Physical Therapy Evaluation Patient Details Name: Glenda Underwood MRN: [id number] DOB: 10-Apr-1933 Today's Date: 06/12/2012 Time: 1041-1100 PT Time Calculation (min): 19 min  PT Assessment / Plan / Recommendation Clinical Impression  78 yo female admitted with acute CHF. Pt was admitted from [facility]. On eval, pt is supervision level assist for all mobility. Able to ambulate ~ 200 feet in hallway with RW. Pt reports using walker at baseline. Do not feel pt has any acute PT needs at this time. 1x eval. Recommend daily mobility with nursing. Will sign off.     PT Assessment  Patent does not need any further PT services    Follow Up Recommendations  No PT follow up    Does the patient have the potential to tolerate intense rehabilitation      Barriers to Discharge        Equipment Recommendations  None recommended by PT    Recommendations for Other Services OT consult   Frequency      Precautions / Restrictions Precautions Precautions: None Restrictions Weight Bearing Restrictions: No   Pertinent Vitals/Pain No c/o      Mobility  Bed Mobility Bed Mobility: Not assessed Transfers Transfers: Sit to Stand;Stand to Sit Sit to Stand: 5: Supervision;From chair/3-in-1 Stand to Sit: 5: Supervision;To chair/3-in-1 Details for Transfer Assistance: min cues for hand placement at times Ambulation/Gait Ambulation/Gait Assistance: 5: Supervision Ambulation Distance (Feet): 200 Feet Assistive device: Rolling walker    Shoulder Instructions     Exercises     PT Diagnosis:    PT Problem List:   PT Treatment Interventions:     PT Goals    Visit Information  Last PT Received On: 06/12/12 Assistance Needed: +1    Subjective Data  Subjective: "I'm walking much better now" Patient Stated Goal: Home   Prior Functioning  Home Living Lives With: Other (Comment) (unclear if family lives with her but have helped PTA) Available Help at Discharge: Family Type of  Home: House Home Access: Stairs to enter Secretary/administrator of Steps: 4 Entrance Stairs-Rails: Right;Left;Can reach both Home Layout: One level Bathroom Shower/Tub: Engineer, manufacturing systems: Standard Home Adaptive Equipment: Paediatric nurse with back;Straight cane;Walker - rolling Prior Function Level of Independence: Independent with assistive device(s);Needs assistance (pt states she makes simple microwave meals) Needs Assistance: Light Housekeeping Light Housekeeping: Moderate (daughter helps) Able to Take Stairs?: Yes Driving: No Communication Communication: No difficulties    Cognition  Overall Cognitive Status: History of cognitive impairments - at baseline Arousal/Alertness: Awake/alert Orientation Level: Appears intact for tasks assessed Behavior During Session: [facility] for tasks performed    Extremity/Trunk Assessment Right Upper Extremity Assessment RUE ROM/Strength/Tone: [facility] for tasks assessed Left Upper Extremity Assessment LUE ROM/Strength/Tone: [facility] for tasks assessed Right Lower Extremity Assessment RLE ROM/Strength/Tone: [facility] for tasks assessed Left Lower Extremity Assessment LLE ROM/Strength/Tone: [facility] for tasks assessed Trunk Assessment Trunk Assessment: Normal   Balance Balance Balance Assessed: Yes Dynamic Standing Balance Dynamic Standing - Level of Assistance: 5: Stand by assistance  End of Session PT - End of Session Equipment Utilized During Treatment: Gait belt Activity Tolerance: Patient tolerated treatment well Patient left: in chair;with call bell/phone within reach;with nursing in room  GP     Rebeca Alert [facility] 06/12/2012, 12:10 PM [phone]

## 2012-06-12 NOTE — Discharge Summary (Signed)
Physician Discharge Summary  Glenda Underwood:[id number] DOB: 1932/07/21 DOA: 06/08/2012  PCP: Rogelia Boga, MD  Admit date: 06/08/2012 Discharge date: 06/13/2012  Time spent: 35 minutes  Recommendations for Outpatient Follow-up:  1. Patient will be discharged tomorrow to the care of her daughter. Daughter is looking for placement options assisted living facilities with memory care units   Discharge Condition: Plan is to discharge home with daughter getting resources for placement into a assisted-living facility with memory care unit  Diet recommendation: Low sodium heart healthy  Filed Weights   06/09/12 0029 06/10/12 0631  Weight: 65.091 kg (143 lb 8 oz) 61.417 kg (135 lb 6.4 oz)    History of present illness:  78 year old white female past medical history of hypertension, depression and mild early dementia who was admitted to [facility] psychiatric facility for depression and confusion issues and found to have malignantly elevated blood pressure with systolic greater than 200+ white blood cell count of 20. Patient was transferred to the hospital service for further evaluation. She started on IV medications for blood pressure control.  Hospital Course/Discharge Diagnoses:  Principal Problem:   *Acute diastolic CHF (congestive heart failure): Patient came in with malignant hypertension. BNP was checked and found to be elevated at 1017. Patient was started on Lasix and diuresed approximately 2 L. Her BUN and creatinine have started to elevate so I stopped the Lasix. Have given patient information about CHF, but with her cognitive dysfunction, difficult to say how much compliant she be able to do. She will benefit from an assisted-living facility. We'll need to monitor for more day given increases in creatinine. She dropped about 8 pounds during this hospitalization. She will go home on by mouth HCTZ for diuresis Active Problems:   DIABETES MELLITUS, TYPE II: Patient on metformin  500 by mouth twice a day for diagnoses of heart failure confirmed, discontinued this. Her blood sugars have been quite stable during her hospitalization ranging between 108-120. Without any coverage or medications. I'm concerned about discharging her on any medication given her very stable A1c. If her sugars that arise, she can start very low dose glipizide.    HYPERLIPIDEMIA: Stable   CORONARY ARTERY DISEASE: Stable   Senile dementia: Mild. Patient does have some early cognitive deficits. Concerned about the ability to take care of herself long-term. Daughter is been given resources for assisted living with memory care facility placement   Depression: Patient with previous over at [facility] psychiatric facility. Given leukocytosis, markedly elevated blood pressures she was transferred to the hospitalist service. She is followed by psychiatry in the hospital who cleared her from a psychiatric standpoint. She continued on Zoloft.   Urinary tract infection: Patient was started on IV antibiotics. She is known to have a leukocytosis on admission. She was changed over to by mouth and finish a full course of antibiotics for urinary tract infection.   Hypokalemia: Secondary to diuresis. Replaced.   Hypertension: Patient markedly elevated blood pressures on admission with systolic greater than 200. By diuresing her and starting her on by mouth clonidine plus her home medications as is improved and stable.   Leucocytosis: Felt to be secondary to stress margination from elevated blood pressures and CHF plus mild UTI.   Procedures:  Echocardiogram done 12/9: Grade 1 diastolic dysfunction  Consultations: Dr.Rasul-psychiatry  Discharge Exam: Filed Vitals:   06/11/12 2145 06/12/12 0204 06/12/12 0604 06/12/12 1040  BP: 152/68 127/59 125/62 144/64  Pulse: 58 58 59 60  Temp: 98.5 F (36.9 C) 99.1  F (37.3 C) 98.8 F (37.1 C) 98.2 F (36.8 C)  TempSrc: Oral Oral Oral Oral  Resp: 18 18 20    Height:       Weight:      SpO2: 98% 98% 97% 98%    General: Alert and oriented x2, flattened affect Cardiovascular: Regular rate and rhythm, S1-S2, 2/6 systolic ejection murmur Respiratory: Clear to auscultation bilaterally Abdomen: Soft, nontender, nondistended, positive bowel sounds  extremities: No edema  Discharge Instructions  Discharge Orders    Future Orders Please Complete By Expires   Diet - low sodium heart healthy      Increase activity slowly          Medication List     As of 06/12/2012  2:50 PM    STOP taking these medications         metFORMIN 500 MG 24 hr tablet   Commonly known as: GLUCOPHAGE-XR      TAKE these medications         acetaminophen 500 MG tablet   Commonly known as: TYLENOL   Take 1,000 mg by mouth every 6 (six) hours as needed. For pain      aspirin 81 MG tablet   Take 81 mg by mouth daily.      cloNIDine 0.1 MG tablet   Commonly known as: CATAPRES   Take 1 tablet (0.1 mg total) by mouth 2 (two) times daily.      donepezil 5 MG tablet   Commonly known as: ARICEPT   Take 5 mg by mouth at bedtime. DEMENTIA      DULoxetine 60 MG capsule   Commonly known as: CYMBALTA   Take 60 mg by mouth daily.      felodipine 10 MG 24 hr tablet   Commonly known as: PLENDIL   Take 10 mg by mouth daily.      lisinopril-hydrochlorothiazide 20-25 MG per tablet   Commonly known as: PRINZIDE,ZESTORETIC   TAKE 1 TABLET DAILY      meloxicam 15 MG tablet   Commonly known as: MOBIC   Take 15 mg by mouth daily.      metoprolol succinate 100 MG 24 hr tablet   Commonly known as: TOPROL-XL   Take 100 mg by mouth daily. Take with or immediately following a meal.      nicotine 7 mg/24hr patch   Commonly known as: NICODERM CQ - dosed in mg/24 hr   Place 1 patch onto the skin daily.      sertraline 25 MG tablet   Commonly known as: ZOLOFT   Take 25 mg by mouth daily.      simvastatin 20 MG tablet   Commonly known as: ZOCOR   Take 20 mg by mouth daily.       solifenacin 10 MG tablet   Commonly known as: VESICARE   Take 10 mg by mouth daily.      traMADol 50 MG tablet   Commonly known as: ULTRAM   Take 50 mg by mouth at bedtime.      traZODone 50 MG tablet   Commonly known as: DESYREL   Take 50 mg by mouth at bedtime.           Follow-up Information    Follow up with Rogelia Boga, MD. Schedule an appointment as soon as possible for a visit in 2 weeks.   Contact information:   [address] [place]ena Flake [place] [address] [phone]  The results of significant diagnostics from this hospitalization (including imaging, microbiology, ancillary and laboratory) are listed below for reference.    Significant Diagnostic Studies: Dg Chest 2 View  06/08/2012  IMPRESSION: No evidence of acute cardiopulmonary disease.   Original Report Authenticated By: Harmon Pier, M.D.    Dg Chest 2 View  06/06/2012  .  IMPRESSION: No active disease.   Original Report Authenticated By: Irish Lack, M.D.    Dg Chest 2 View  06/02/2012    IMPRESSION: Borderline heart size.  No acute cardiopulmonary disease.   Original Report Authenticated By: Hulan Saas, M.D.     Microbiology: Recent Results (from the past 240 hour(s))  URINE CULTURE     Status: Normal   Collection Time   06/08/12  9:57 PM      Component Value Range Status Comment   Specimen Description URINE, CLEAN CATCH   Final    Special Requests NONE   Final    Culture  Setup Time 06/09/2012 06:31   Final    Colony Count NO GROWTH   Final    Culture NO GROWTH   Final    Report Status 06/10/2012 FINAL   Final      Labs: Basic Metabolic Panel:  Lab 06/12/12 1478 06/11/12 0441 06/10/12 0535 06/09/12 0447 06/08/12 2017  NA 134* 134* 135 136 138  K 3.8 3.0* 3.5 3.7 3.3*  CL 98 95* 100 100 101  CO2 25 24 23 23 23   GLUCOSE 123* 125* 127* 129* 137*  BUN 34* 28* 21 20 21   CREATININE 1.19* 1.14* 0.89 0.86 0.89  CALCIUM 9.2 9.3 9.4 9.5 9.7  MG -- -- -- 2.0 --   PHOS -- -- -- -- --   Liver Function Tests:  Lab 06/06/12 2105  AST 30  ALT 31  ALKPHOS 93  BILITOT 0.4  PROT 7.3  ALBUMIN 3.9   CBC:  Lab 06/11/12 0441 06/10/12 0535 06/09/12 0447 06/08/12 2017 06/06/12 2105  WBC 10.9* 10.2 15.6* 20.5* 12.9*  NEUTROABS -- -- -- 17.0* --  HGB 12.5 14.0 13.5 14.1 14.2  HCT 36.9 40.8 38.9 40.5 40.5  MCV 82.6 83.4 82.8 82.2 81.8  PLT 281 287 290 293 325   Cardiac Enzymes:  Lab 06/09/12 1256  CKTOTAL --  CKMB --  CKMBINDEX --  TROPONINI <0.30   BNP: BNP (last 3 results)  Basename 06/11/12 0441 06/09/12 1256  PROBNP 690.4* 1017.0*   CBG:  Lab 06/12/12 1200 06/12/12 0751 06/11/12 2254 06/11/12 1609 06/11/12 1122  GLUCAP 122* 124* 108* 100* 154*       Signed:  Stony Stegmann K  [facility] 06/12/2012, 2:50 PM

## 2012-06-12 NOTE — Evaluation (Signed)
Occupational Therapy Evaluation Patient Details Name: Glenda Underwood MRN: [id number] DOB: 01-24-1933 Today's Date: 06/12/2012 Time: 1041-1100 OT Time Calculation (min): 19 min  OT Assessment / Plan / Recommendation Clinical Impression  78 year old female with history of HTN, dyslipidemia, diabetes, depression who presented to ED for acute onset left sided chest pain that started at rest 1 day prior to this admission. Pt currently is supervision overall with ADL and has no skilled OT needs at this time. Feel pt is ok to get up with nursing PRN for ADL .     OT Assessment  Patient does not need any further OT services    Follow Up Recommendations  No OT follow up    Barriers to Discharge      Equipment Recommendations  None recommended by OT    Recommendations for Other Services    Frequency       Precautions / Restrictions Precautions Precautions: None        ADL  Eating/Feeding: Simulated;Independent Where Assessed - Eating/Feeding: Chair Grooming: Performed;Supervision/safety Where Assessed - Grooming: Unsupported standing Upper Body Bathing: Simulated;Chest;Right arm;Left arm;Abdomen;Set up Where Assessed - Upper Body Bathing: Unsupported sitting Lower Body Bathing: Simulated;Supervision/safety Where Assessed - Lower Body Bathing: Supported sit to stand Upper Body Dressing: Simulated;Set up Where Assessed - Upper Body Dressing: Unsupported sitting Lower Body Dressing: Simulated;Supervision/safety Where Assessed - Lower Body Dressing: Supported sit to stand Toilet Transfer: Performed;Supervision/safety;Other (comment) (min verbal cues for hand placement) Toilet Transfer Equipment: Comfort height toilet;Grab bars Toileting - Clothing Manipulation and Hygiene: Simulated;Supervision/safety Where Assessed - Toileting Clothing Manipulation and Hygiene: Sit to stand from 3-in-1 or toilet Equipment Used: Rolling walker ADL Comments: Pt at supervision level and appears close  to or at baseline. No further skilled OT needs. Feel pt ok to get up with nursing while on acute.     OT Diagnosis:    OT Problem List:   OT Treatment Interventions:     OT Goals    Visit Information  Last OT Received On: 06/12/12 Assistance Needed: +1 PT/OT Co-Evaluation/Treatment: Yes    Subjective Data  Subjective: I feel like I am doing better moving Patient Stated Goal: none stated. agreeable to get up with therapy   Prior Functioning     Home Living Lives With: Other (Comment) (unclear if family lives with her but have helped PTA) Available Help at Discharge: Family Type of Home: House Home Access: Stairs to enter Secretary/administrator of Steps: 4 Entrance Stairs-Rails: Right;Left;Can reach both Home Layout: One level Bathroom Shower/Tub: Engineer, manufacturing systems: Standard Home Adaptive Equipment: Paediatric nurse with back;Straight cane;Walker - rolling Prior Function Level of Independence: Independent with assistive device(s);Needs assistance (pt states she makes simple microwave meals) Needs Assistance: Light Housekeeping Light Housekeeping: Moderate (daughter helps) Able to Take Stairs?: Yes Driving: No Communication Communication: No difficulties         Vision/Perception     Cognition  Overall Cognitive Status: History of cognitive impairments - at baseline Arousal/Alertness: Awake/alert Orientation Level: Appears intact for tasks assessed Behavior During Session: [facility] for tasks performed    Extremity/Trunk Assessment Right Upper Extremity Assessment RUE ROM/Strength/Tone: [facility] for tasks assessed Left Upper Extremity Assessment LUE ROM/Strength/Tone: WFL for tasks assessed     Mobility Transfers Transfers: Sit to Stand;Stand to Sit Sit to Stand: 5: Supervision;With upper extremity assist;From chair/3-in-1;From toilet Stand to Sit: 5: Supervision;With upper extremity assist;To chair/3-in-1;To toilet Details for Transfer Assistance: min  cues for hand placement at times  Shoulder Instructions     Exercise     Balance Balance Balance Assessed: Yes Dynamic Standing Balance Dynamic Standing - Level of Assistance: 5: Stand by assistance   End of Session OT - End of Session Activity Tolerance: Patient tolerated treatment well Patient left: in chair;with call bell/phone within reach;Other (comment) (with sitter) Nurse Communication: Mobility status  GO     Lennox Laity [phone] 06/12/2012, 11:43 AM

## 2012-06-13 DIAGNOSIS — E119 Type 2 diabetes mellitus without complications: Secondary | ICD-10-CM

## 2012-06-13 LAB — GLUCOSE, CAPILLARY
Glucose-Capillary: 110 mg/dL — ABNORMAL HIGH (ref 70–99)
Glucose-Capillary: 112 mg/dL — ABNORMAL HIGH (ref 70–99)

## 2012-06-13 MED ORDER — POLYETHYLENE GLYCOL 3350 17 G PO PACK
17.0000 g | PACK | Freq: Every day | ORAL | Status: DC
  Administered 2012-06-13 – 2012-06-15 (×3): 17 g via ORAL
  Filled 2012-06-13 (×3): qty 1

## 2012-06-13 NOTE — Progress Notes (Signed)
ED Cm noted daughter did not arrive to receive the envelope left on 05/29/12 for her about available home health resources Envelope remained in triage until 06/12/12 Cm discarded

## 2012-06-13 NOTE — Progress Notes (Signed)
[facility] PROGRESS NOTE  Glenda Underwood WGN:[id number] DOB: 02/13/1933 DOA: 06/08/2012 PCP: Rogelia Boga, MD  Assessment/Plan: 1. S/p acute diastolic CHF--stable. Check weight in AM. Continue diuretic and beta blocker. 2. Acute encephalopathy--resolved, etiology unclear. 3. Uncontrolled HTN--now well controlled on current regimen. 4. Leukocytosis--resolved, etiology unclear, infectious workup negative. 5. DM type 2--off metformin with diagnosed CHF. Well controlled with diet alone. 6. Depression--stable per psychiatry 7. Dementia--stable   Family not present, but daughter reported to staff that she has no safe place to take her mother. CSW to explore options with daughter 12/12 for discharge.  Code Status: Full code Family Communication: none present Disposition Plan: pending CSW/daughter discussion  [name], MD  [facility] Team 6 Pager [phone] If 8PM-8AM, please contact night-coverage at [email or web address], password [facility] 06/13/2012, 6:59 PM  LOS: 5 days   Brief narrative: 78 year old white female past medical history of hypertension, depression and mild early dementia who was admitted to [facility] psychiatric facility for depression and confusion issues and found to have malignantly elevated blood pressure with systolic greater than 200+ white blood cell count of 20. Patient was transferred to the hospital service for further evaluation. She started on IV medications for blood pressure control.  Consultants:  Psychiatry--outpatient psychiatric care, risperidone .25- 0.5 mg QHS for agitiation   OT--none needed  PT--none needed  Procedures:  12/9 2-D echocardiogram--Left ventricle: The cavity size was normal. Systolic function was normal. The estimated ejection fraction was in the range of 55% to 60%. Wall motion was normal; there were no regional wall motion abnormalities. There was an increased relative contribution of atrial contraction  to ventricular filling. Doppler parameters are consistent with abnormal left ventricular relaxation (grade 1 diastolic dysfunction).  HPI/Subjective: No complaints, no pain. Worried about her dog.  Objective: Filed Vitals:   06/13/12 1129 06/13/12 1130 06/13/12 1131 06/13/12 1400  BP: 108/50 108/50 108/50 110/55  Pulse:  60  56  Temp:    97.6 F (36.4 C)  TempSrc:    Oral  Resp:    20  Height:      Weight:      SpO2:    100%    Intake/Output Summary (Last 24 hours) at 06/13/12 1859 Last data filed at 06/13/12 1800  Gross per 24 hour  Intake    360 ml  Output    200 ml  Net    160 ml   Filed Weights   06/09/12 0029 06/10/12 0631  Weight: 65.091 kg (143 lb 8 oz) 61.417 kg (135 lb 6.4 oz)    Exam:  General:  Appears calm and comfortable Cardiovascular: RRR, no m/r/g. No LE edema. Respiratory: CTA bilaterally, no w/r/r. Normal respiratory effort. Abdomen: soft, ntnd Psychiatric: grossly normal mood and affect, speech fluent and appropriate  Data Reviewed: Basic Metabolic Panel:  Lab 06/12/12 9528 06/11/12 0441 06/10/12 0535 06/09/12 0447 06/08/12 2017  NA 134* 134* 135 136 138  K 3.8 3.0* 3.5 3.7 3.3*  CL 98 95* 100 100 101  CO2 25 24 23 23 23   GLUCOSE 123* 125* 127* 129* 137*  BUN 34* 28* 21 20 21   CREATININE 1.19* 1.14* 0.89 0.86 0.89  CALCIUM 9.2 9.3 9.4 9.5 9.7  MG -- -- -- 2.0 --  PHOS -- -- -- -- --   Liver Function Tests:  Lab 06/06/12 2105  AST 30  ALT 31  ALKPHOS 93  BILITOT 0.4  PROT 7.3  ALBUMIN 3.9   CBC:  Lab 06/11/12 0441 06/10/12 0535 06/09/12  0447 06/08/12 2017 06/06/12 2105  WBC 10.9* 10.2 15.6* 20.5* 12.9*  NEUTROABS -- -- -- 17.0* --  HGB 12.5 14.0 13.5 14.1 14.2  HCT 36.9 40.8 38.9 40.5 40.5  MCV 82.6 83.4 82.8 82.2 81.8  PLT 281 287 290 293 325   Cardiac Enzymes:  Lab 06/09/12 1256  CKTOTAL --  CKMB --  CKMBINDEX --  TROPONINI <0.30   BNP (last 3 results)  Basename 06/11/12 0441 06/09/12 1256  PROBNP 690.4*  1017.0*   CBG:  Lab 06/13/12 1535 06/13/12 1132 06/13/12 0723 06/12/12 2234 06/12/12 1711  GLUCAP 110* 120* 127* 107* 95    Recent Results (from the past 240 hour(s))  URINE CULTURE     Status: Normal   Collection Time   06/08/12  9:57 PM      Component Value Range Status Comment   Specimen Description URINE, CLEAN CATCH   Final    Special Requests NONE   Final    Culture  Setup Time 06/09/2012 06:31   Final    Colony Count NO GROWTH   Final    Culture NO GROWTH   Final    Report Status 06/10/2012 FINAL   Final      Studies: No results found.  Scheduled Meds:   . aspirin  81 mg Oral Daily  . cloNIDine  0.1 mg Oral BID  . darifenacin  15 mg Oral Daily  . donepezil  5 mg Oral QHS  . DULoxetine  60 mg Oral Daily  . enoxaparin (LOVENOX) injection  40 mg Subcutaneous Q24H  . felodipine  10 mg Oral Daily  . lisinopril  20 mg Oral Daily   And  . hydrochlorothiazide  25 mg Oral Daily  . insulin aspart  0-5 Units Subcutaneous QHS  . insulin aspart  0-9 Units Subcutaneous TID WC  . meloxicam  15 mg Oral Daily  . metoprolol succinate  100 mg Oral Daily  . nicotine  7 mg Transdermal Daily  . polyethylene glycol  17 g Oral Daily  . simvastatin  20 mg Oral Daily   Continuous Infusions:   Principal Problem:  *Acute diastolic CHF (congestive heart failure) Active Problems:  DIABETES MELLITUS, TYPE II  CORONARY ARTERY DISEASE  Altered mental status  Depression  Hypokalemia  Hypertension  Leucocytosis     [name], MD  [facility] Team 6 Pager [phone] If 8PM-8AM, please contact night-coverage at [email or web address], password [facility] 06/13/2012, 6:59 PM  LOS: 5 days   Time spent: 35 minutes

## 2012-06-13 NOTE — Social Work (Signed)
CSW met with patient's daughter in order to identify discharge plans. Patient has been cleared from inpt psych and has been cleared of skilled needs. Once medically cleared patient would go home, but daughter expressed that at this time she does not have stable housing and is in transition. Daughter states that she has transitioned out of the home she was living in because of the end of a relationship.  Daughter states that her next home will not be available for another week and therefore does not have anywhere to bring patient to. CSW provided patient's daughter with listing of Assisted Living Facilities that may be able to accommodate patient's needs.  CSW will continue to follow until d/c.  Beverly Sessions MSW, LCSW [phone]

## 2012-06-14 DIAGNOSIS — I5021 Acute systolic (congestive) heart failure: Secondary | ICD-10-CM

## 2012-06-14 LAB — GLUCOSE, CAPILLARY
Glucose-Capillary: 126 mg/dL — ABNORMAL HIGH (ref 70–99)
Glucose-Capillary: 115 mg/dL — ABNORMAL HIGH (ref 70–99)

## 2012-06-14 LAB — BASIC METABOLIC PANEL
Calcium: 9.1 mg/dL (ref 8.4–10.5)
GFR calc non Af Amer: 47 mL/min — ABNORMAL LOW (ref >90)
Sodium: 135 mEq/L (ref 135–145)

## 2012-06-14 NOTE — Progress Notes (Signed)
[facility] PROGRESS NOTE  RANA HOCHSTEIN WGN:[id number] DOB: 1933/04/10 DOA: 06/08/2012 PCP: Rogelia Boga, MD  Assessment/Plan: 1. S/p acute diastolic CHF--stable. Continue diuretic and beta blocker. 2. Acute encephalopathy--resolved, etiology unclear. 3. Uncontrolled HTN--now well controlled on current regimen. 4. Leukocytosis--resolved, etiology unclear, infectious workup negative. 5. DM type 2--off metformin with diagnosed CHF. Well controlled with diet alone. 6. Depression--stable per psychiatry 7. Dementia--stable   CSW exploring options for safe discharge--per CSW plan for 12/13 AM. Stable for discharge today.  Code Status: Full code Family Communication: none present Disposition Plan: 12/13  [name], MD  [facility] Team 6 Pager [phone] If 8PM-8AM, please contact night-coverage at [email or web address], password [facility] 06/14/2012, 4:41 PM  LOS: 6 days   Brief narrative: 78 year old white female past medical history of hypertension, depression and mild early dementia who was admitted to [facility] psychiatric facility for depression and confusion issues and found to have malignantly elevated blood pressure with systolic greater than 200+ white blood cell count of 20. Patient was transferred to the hospital service for further evaluation. She started on IV medications for blood pressure control.  Consultants:  Psychiatry--outpatient psychiatric care, risperidone .25- 0.5 mg QHS for agitiation   OT--none needed  PT--none needed  Procedures:  12/9 2-D echocardiogram--Left ventricle: The cavity size was normal. Systolic function was normal. The estimated ejection fraction was in the range of 55% to 60%. Wall motion was normal; there were no regional wall motion abnormalities. There was an increased relative contribution of atrial contraction to ventricular filling. Doppler parameters are consistent with abnormal left ventricular relaxation (grade  1 diastolic dysfunction).  HPI/Subjective: Feels fine, no complaints.  Objective: Filed Vitals:   06/14/12 0454 06/14/12 0941 06/14/12 0942 06/14/12 1400  BP: 135/50 132/50  97/46  Pulse: 58  56 55  Temp: 98.1 F (36.7 C)   98.5 F (36.9 C)  TempSrc: Oral   Oral  Resp: 18   20  Height:      Weight:      SpO2: 100%   100%    Intake/Output Summary (Last 24 hours) at 06/14/12 1641 Last data filed at 06/14/12 1400  Gross per 24 hour  Intake    840 ml  Output    550 ml  Net    290 ml   Filed Weights   06/09/12 0029 06/10/12 0631  Weight: 65.091 kg (143 lb 8 oz) 61.417 kg (135 lb 6.4 oz)    Exam:  General:  Appears calm and comfortable, sitting on side of bed Cardiovascular: RRR, no m/r/g.  Respiratory: CTA bilaterally, no w/r/r. Normal respiratory effort. Psychiatric: grossly normal mood and affect, speech fluent and appropriate  Data Reviewed: Basic Metabolic Panel:  Lab 06/14/12 9528 06/12/12 0426 06/11/12 0441 06/10/12 0535 06/09/12 0447  NA 135 134* 134* 135 136  K 3.8 3.8 3.0* 3.5 3.7  CL 98 98 95* 100 100  CO2 27 25 24 23 23   GLUCOSE 130* 123* 125* 127* 129*  BUN 28* 34* 28* 21 20  CREATININE 1.11* 1.19* 1.14* 0.89 0.86  CALCIUM 9.1 9.2 9.3 9.4 9.5  MG -- -- -- -- 2.0  PHOS -- -- -- -- --   CBC:  Lab 06/11/12 0441 06/10/12 0535 06/09/12 0447 06/08/12 2017  WBC 10.9* 10.2 15.6* 20.5*  NEUTROABS -- -- -- 17.0*  HGB 12.5 14.0 13.5 14.1  HCT 36.9 40.8 38.9 40.5  MCV 82.6 83.4 82.8 82.2  PLT 281 287 290 293   Cardiac Enzymes:  Lab 06/09/12  1256  CKTOTAL --  CKMB --  CKMBINDEX --  TROPONINI <0.30   BNP (last 3 results)  Basename 06/11/12 0441 06/09/12 1256  PROBNP 690.4* 1017.0*   CBG:  Lab 06/14/12 1146 06/14/12 0729 06/13/12 2159 06/13/12 1535 06/13/12 1132  GLUCAP 112* 126* 112* 110* 120*    Recent Results (from the past 240 hour(s))  URINE CULTURE     Status: Normal   Collection Time   06/08/12  9:57 PM      Component Value Range  Status Comment   Specimen Description URINE, CLEAN CATCH   Final    Special Requests NONE   Final    Culture  Setup Time 06/09/2012 06:31   Final    Colony Count NO GROWTH   Final    Culture NO GROWTH   Final    Report Status 06/10/2012 FINAL   Final      Studies: No results found.  Scheduled Meds:    . aspirin  81 mg Oral Daily  . cloNIDine  0.1 mg Oral BID  . darifenacin  15 mg Oral Daily  . donepezil  5 mg Oral QHS  . DULoxetine  60 mg Oral Daily  . enoxaparin (LOVENOX) injection  40 mg Subcutaneous Q24H  . felodipine  10 mg Oral Daily  . lisinopril  20 mg Oral Daily   And  . hydrochlorothiazide  25 mg Oral Daily  . insulin aspart  0-5 Units Subcutaneous QHS  . insulin aspart  0-9 Units Subcutaneous TID WC  . meloxicam  15 mg Oral Daily  . metoprolol succinate  100 mg Oral Daily  . nicotine  7 mg Transdermal Daily  . polyethylene glycol  17 g Oral Daily  . simvastatin  20 mg Oral Daily   Continuous Infusions:   Principal Problem:  *Acute diastolic CHF (congestive heart failure) Active Problems:  DIABETES MELLITUS, TYPE II  CORONARY ARTERY DISEASE  Altered mental status  Depression  Hypokalemia  Hypertension  Leucocytosis     [name], MD  [facility] Team 6 Pager [phone] If 8PM-8AM, please contact night-coverage at [email or web address], password [facility] 06/14/2012, 4:41 PM  LOS: 6 days   Time spent: 20 minutes

## 2012-06-15 LAB — GLUCOSE, CAPILLARY
Glucose-Capillary: 111 mg/dL — ABNORMAL HIGH (ref 70–99)
Glucose-Capillary: 91 mg/dL (ref 70–99)

## 2012-06-15 MED ORDER — CLONIDINE HCL 0.1 MG PO TABS: 0.1000 mg | ORAL_TABLET | Freq: Two times a day (BID) | ORAL | Status: DC

## 2012-06-15 NOTE — Discharge Summary (Signed)
Physician Discharge Summary  Glenda Underwood:[id number] DOB: January 19, 1933 DOA: 06/08/2012  PCP: Glenda Boga, MD  Admit date: 06/08/2012 Discharge date: 06/15/2012  Recommendations for Outpatient Follow-up:  1. Follow-up adjustment to assisted living 2. Follow-up dementia and depression 3. Follow-up diastolic heart failure with cardiology  Follow-up Information    Follow up with Glenda Boga, MD. Schedule an appointment as soon as possible for a visit in 2 weeks.   Contact information:   [address] [place] [address] [phone]       Follow up with Glenda Rotunda, MD. Schedule an appointment as soon as possible for a visit in 1 week.   Contact information:   [address] [place] [address] [phone]         Discharge Diagnoses:  1. S/p acute diastolic CHF 2. Acute encephalopathy 3. Uncontrolled HTN 4. DM type 2 5. Depression 6. Dementia  Discharge Condition: improved Disposition: assisted living  Diet recommendation: heart healthy, diabetic diet  Filed Weights   06/09/12 0029 06/10/12 0631 06/15/12 0636  Weight: 65.091 kg (143 lb 8 oz) 61.417 kg (135 lb 6.4 oz) 63.9 kg (140 lb 14 oz)    History of present illness:  78 year old white female past medical history of hypertension, depression and dementia who was admitted to [facility] psychiatric facility for depression and confusion issues and found to have malignantly elevated blood pressure with systolic greater than 200+ white blood cell count of 20, hypokalemia sent to ED under IVC.   Hospital Course:  [name] was admitted to the hospital service for further evaluation after [facility] refused transport back. Hypertension improved with diuresis and treatment of diastolic CHF and adjustment of anti-hypertensives. Patient was seen by psychiatry and IVC discontinued as no SI; main issue felt to be cognitive and patient was cleared for  discharge with recommendations for outpatient counseling was recommended. She was also treated for a UTI. It is noted that on a prior hospitalization 11/30 patient had some chest pain and outpatient cardiology evaluation was recommended. Patient had no chest pain during hospitalization and can follow-up with cardiology as an outpatient. Discharge was delayed by patient's inability to care for self (lack of capacity per telepsych earlier this month) and no place for patient to go. Daughter and CSW have arranged for ALF.  1. S/p acute diastolic CHF--stable. Continue diuretic and beta blocker. Follow-up with [facility] cardiology as an outpatient.  2. Acute encephalopathy--resolved, secondary to dementia, acute medical issues.  3. Uncontrolled HTN--now well controlled on current regimen.  4. Leukocytosis--resolved, etiology unclear, infectious workup negative.  5. DM type 2--off metformin with diagnosed CHF. Well controlled with diet alone.  6. Depression--stable per psychiatry  7. Dementia--stable.   Consultants:  Psychiatry--Cognitive deficits main issues right now and she will benefit from psy out pt care, risperidone .25- 0.5 mg QHS for agitiation    OT--none needed   PT--none needed  Procedures:  12/9 2-D echocardiogram--Left ventricle: The cavity size was normal. Systolic function was normal. The estimated ejection fraction was in the range of 55% to 60%. Wall motion was normal; there were no regional wall motion abnormalities. There was an increased relative contribution of atrial contraction to ventricular filling. Doppler parameters are consistent with abnormal left ventricular relaxation (grade 1 diastolic dysfunction).  Discharge Instructions  Discharge Orders    Future Orders Please Complete By Expires   Diet - low sodium heart healthy      Diet - low sodium heart  healthy      Diet Carb Modified      Increase activity slowly      Discharge instructions      Comments:   Take  all medications as directed. Follow-up with Dr. Wynelle Cleveland as directed and with cardiologist as directed. Call physician or seek immediate medical attention for increased confusion, pain, change in condition.   Activity as tolerated - No restrictions          Medication List     As of 06/15/2012 12:11 PM    STOP taking these medications         acetaminophen 500 MG tablet   Commonly known as: TYLENOL      metFORMIN 500 MG 24 hr tablet   Commonly known as: GLUCOPHAGE-XR      sertraline 25 MG tablet   Commonly known as: ZOLOFT      traMADol 50 MG tablet   Commonly known as: ULTRAM      traZODone 50 MG tablet   Commonly known as: DESYREL      TAKE these medications         aspirin 81 MG tablet   Take 81 mg by mouth daily.      cloNIDine 0.1 MG tablet   Commonly known as: CATAPRES   Take 1 tablet (0.1 mg total) by mouth 2 (two) times daily.      donepezil 5 MG tablet   Commonly known as: ARICEPT   Take 5 mg by mouth at bedtime. DEMENTIA      DULoxetine 60 MG capsule   Commonly known as: CYMBALTA   Take 60 mg by mouth daily.      felodipine 10 MG 24 hr tablet   Commonly known as: PLENDIL   Take 10 mg by mouth daily.      lisinopril-hydrochlorothiazide 20-25 MG per tablet   Commonly known as: PRINZIDE,ZESTORETIC   TAKE 1 TABLET DAILY      meloxicam 15 MG tablet   Commonly known as: MOBIC   Take 15 mg by mouth daily.      metoprolol succinate 100 MG 24 hr tablet   Commonly known as: TOPROL-XL   Take 100 mg by mouth daily. Take with or immediately following a meal.      nicotine 7 mg/24hr patch   Commonly known as: NICODERM CQ - dosed in mg/24 hr   Place 1 patch onto the skin daily.      simvastatin 20 MG tablet   Commonly known as: ZOCOR   Take 20 mg by mouth daily.      solifenacin 10 MG tablet   Commonly known as: VESICARE   Take 10 mg by mouth daily.       The results of significant diagnostics from this hospitalization (including imaging,  microbiology, ancillary and laboratory) are listed below for reference.    Significant Diagnostic Studies: Dg Chest 2 View  06/08/2012  *RADIOLOGY REPORT*  Clinical Data: 78 year old female with chest pain.  CHEST - 2 VIEW  Comparison: 06/06/2012  Findings: Upper limits normal heart size noted. The lungs are clear. There is no evidence of focal airspace disease, pulmonary edema, suspicious pulmonary nodule/mass, pleural effusion, or pneumothorax. No acute bony abnormalities are identified.  IMPRESSION: No evidence of acute cardiopulmonary disease.   Original Report Authenticated By: Harmon Pier, M.D.    Microbiology: Recent Results (from the past 240 hour(s))  URINE CULTURE     Status: Normal   Collection Time   06/08/12  9:57  PM      Component Value Range Status Comment   Specimen Description URINE, CLEAN CATCH   Final    Special Requests NONE   Final    Culture  Setup Time 06/09/2012 06:31   Final    Colony Count NO GROWTH   Final    Culture NO GROWTH   Final    Report Status 06/10/2012 FINAL   Final      Labs: Basic Metabolic Panel:  Lab 06/14/12 4098 06/12/12 0426 06/11/12 0441 06/10/12 0535 06/09/12 0447  NA 135 134* 134* 135 136  K 3.8 3.8 3.0* 3.5 3.7  CL 98 98 95* 100 100  CO2 27 25 24 23 23   GLUCOSE 130* 123* 125* 127* 129*  BUN 28* 34* 28* 21 20  CREATININE 1.11* 1.19* 1.14* 0.89 0.86  CALCIUM 9.1 9.2 9.3 9.4 9.5  MG -- -- -- -- 2.0  PHOS -- -- -- -- --   CBC:  Lab 06/11/12 0441 06/10/12 0535 06/09/12 0447 06/08/12 2017  WBC 10.9* 10.2 15.6* 20.5*  NEUTROABS -- -- -- 17.0*  HGB 12.5 14.0 13.5 14.1  HCT 36.9 40.8 38.9 40.5  MCV 82.6 83.4 82.8 82.2  PLT 281 287 290 293   Cardiac Enzymes:  Lab 06/09/12 1256  CKTOTAL --  CKMB --  CKMBINDEX --  TROPONINI <0.30   BNP: BNP (last 3 results)  Basename 06/11/12 0441 06/09/12 1256  PROBNP 690.4* 1017.0*   CBG:  Lab 06/15/12 0746 06/14/12 2104 06/14/12 1528 06/14/12 1146 06/14/12 0729  GLUCAP 111* 115* 107*  112* 126*    Principal Problem:  *Acute diastolic CHF (congestive heart failure) Active Problems:  DIABETES MELLITUS, TYPE II  CORONARY ARTERY DISEASE  Altered mental status  Depression  Hypokalemia  Hypertension  Leucocytosis   Time coordinating discharge: 35 minutes  Signed:  [name], MD [facility] 06/15/2012, 12:11 PM

## 2012-06-15 NOTE — Progress Notes (Signed)
Pt will be d/c'd to [facility] today. Daughter will transport pt to SNF. Discharge instructions, Rx & packet given to pt's dau.

## 2012-06-15 NOTE — Progress Notes (Signed)
[facility] PROGRESS NOTE  Glenda Underwood JXB:[id number] DOB: [birth year] DOA: 06/08/2012 PCP: Rogelia Boga, MD  Assessment/Plan: 1. S/p acute diastolic CHF--stable. Continue diuretic and beta blocker. Follow-up with [facility] cardiology as an outpatient. 2. Acute encephalopathy--resolved, secondary to dementia. 3. Uncontrolled HTN--now well controlled on current regimen. 4. Leukocytosis--resolved, etiology unclear, infectious workup negative. 5. DM type 2--off metformin with diagnosed CHF. Well controlled with diet alone. 6. Depression--stable per psychiatry 7. Dementia--stable.   Discussed with daugther at bedside, all questions answered, she concurs with discharge today. ALF is planned. Discussed with CSW.  Code Status: Full code Family Communication: as above Disposition Plan: discharge today, ALF  [name], MD  [facility] Team 6 Pager [phone] If 8PM-8AM, please contact night-coverage at [email or web address], password [facility] 06/15/2012, 11:21 AM  LOS: 7 days   Brief narrative: 78 year old white female past medical history of hypertension, depression and mild early dementia who was admitted to [facility] psychiatric facility for depression and confusion issues and found to have malignantly elevated blood pressure with systolic greater than 200+ white blood cell count of 20, hypokalemia under IVC.   Patient was transferred to the hospital service for further evaluation after [facility] refused transport back. Hypertension improved with diuresis and treatment of diastolic CHF. Patient was seen by psychiatry and IVC discontinued as no SI; main issue felt to be cognitive. Outpatient counseling was recommended. She was also treated for a UTI.  Consultants:  Psychiatry--Cognitive deficits main issues right now and she will benefit from psy out pt care, risperidone .25- 0.5 mg QHS for agitiation   OT--none needed  PT--none needed  Procedures:  12/9 2-D  echocardiogram--Left ventricle: The cavity size was normal. Systolic function was normal. The estimated ejection fraction was in the range of 55% to 60%. Wall motion was normal; there were no regional wall motion abnormalities. There was an increased relative contribution of atrial contraction to ventricular filling. Doppler parameters are consistent with abnormal left ventricular relaxation (grade 1 diastolic dysfunction).  HPI/Subjective: No new issues. Feels ok.  Objective: Filed Vitals:   06/14/12 0942 06/14/12 1400 06/14/12 2105 06/15/12 0636  BP:  97/46 162/72 153/52  Pulse: 56 55 59 57  Temp:  98.5 F (36.9 C) 98.2 F (36.8 C) 97.8 F (36.6 C)  TempSrc:  Oral Oral Oral  Resp:  20 18 16   Height:      Weight:    63.9 kg (140 lb 14 oz)  SpO2:  100% 99% 96%    Intake/Output Summary (Last 24 hours) at 06/15/12 1121 Last data filed at 06/14/12 1800  Gross per 24 hour  Intake    360 ml  Output      0 ml  Net    360 ml   Filed Weights   06/09/12 0029 06/10/12 0631 06/15/12 0636  Weight: 65.091 kg (143 lb 8 oz) 61.417 kg (135 lb 6.4 oz) 63.9 kg (140 lb 14 oz)    Exam:  General:  Appears calm and comfortable. Cardiovascular: RRR, no m/r/g.  Respiratory: CTA bilaterally, no w/r/r. Normal respiratory effort. Psychiatric: grossly normal mood and affect, speech fluent and appropriate  Data Reviewed: Basic Metabolic Panel:  Lab 06/14/12 8469 06/12/12 0426 06/11/12 0441 06/10/12 0535 06/09/12 0447  NA 135 134* 134* 135 136  K 3.8 3.8 3.0* 3.5 3.7  CL 98 98 95* 100 100  CO2 27 25 24 23 23   GLUCOSE 130* 123* 125* 127* 129*  BUN 28* 34* 28* 21 20  CREATININE 1.11* 1.19* 1.14* 0.89  0.86  CALCIUM 9.1 9.2 9.3 9.4 9.5  MG -- -- -- -- 2.0  PHOS -- -- -- -- --   CBC:  Lab 06/11/12 0441 06/10/12 0535 06/09/12 0447 06/08/12 2017  WBC 10.9* 10.2 15.6* 20.5*  NEUTROABS -- -- -- 17.0*  HGB 12.5 14.0 13.5 14.1  HCT 36.9 40.8 38.9 40.5  MCV 82.6 83.4 82.8 82.2  PLT 281  287 290 293   Cardiac Enzymes:  Lab 06/09/12 1256  CKTOTAL --  CKMB --  CKMBINDEX --  TROPONINI <0.30   BNP (last 3 results)  Basename 06/11/12 0441 06/09/12 1256  PROBNP 690.4* 1017.0*   CBG:  Lab 06/15/12 0746 06/14/12 2104 06/14/12 1528 06/14/12 1146 06/14/12 0729  GLUCAP 111* 115* 107* 112* 126*    Recent Results (from the past 240 hour(s))  URINE CULTURE     Status: Normal   Collection Time   06/08/12  9:57 PM      Component Value Range Status Comment   Specimen Description URINE, CLEAN CATCH   Final    Special Requests NONE   Final    Culture  Setup Time 06/09/2012 06:31   Final    Colony Count NO GROWTH   Final    Culture NO GROWTH   Final    Report Status 06/10/2012 FINAL   Final      Studies: No results found.  Scheduled Meds:    . aspirin  81 mg Oral Daily  . cloNIDine  0.1 mg Oral BID  . darifenacin  15 mg Oral Daily  . donepezil  5 mg Oral QHS  . DULoxetine  60 mg Oral Daily  . enoxaparin (LOVENOX) injection  40 mg Subcutaneous Q24H  . felodipine  10 mg Oral Daily  . lisinopril  20 mg Oral Daily   And  . hydrochlorothiazide  25 mg Oral Daily  . insulin aspart  0-5 Units Subcutaneous QHS  . insulin aspart  0-9 Units Subcutaneous TID WC  . meloxicam  15 mg Oral Daily  . metoprolol succinate  100 mg Oral Daily  . nicotine  7 mg Transdermal Daily  . polyethylene glycol  17 g Oral Daily  . simvastatin  20 mg Oral Daily   Continuous Infusions:   Principal Problem:  *Acute diastolic CHF (congestive heart failure) Active Problems:  DIABETES MELLITUS, TYPE II  CORONARY ARTERY DISEASE  Altered mental status  Depression  Hypokalemia  Hypertension  Leucocytosis     [name], MD  [facility] Team 6 Pager [phone] If 8PM-8AM, please contact night-coverage at [email or web address], password [facility] 06/15/2012, 11:21 AM  LOS: 7 days

## 2012-06-15 NOTE — Progress Notes (Signed)
Pt for d/c today to SNF[facility] vs. dau's home depending on what arrangements could be done per SW. No IV site noted. Denies any pain or discomfort at this time. Awaiting for SW to give a go signal for d/c today.

## 2012-06-15 NOTE — Social Work (Signed)
CSW met with and facilitated discharge to [facility]F.  No further CSW needs.  Beverly Sessions MSW, LCSW [phone]

## 2012-06-19 ENCOUNTER — Telehealth: Payer: Self-pay | Admitting: *Deleted

## 2012-06-19 NOTE — Telephone Encounter (Signed)
(  Fax sent 12/17 from  [facility] ) Follow up  Glenda Underwood needs to know how often to do pt's glucose checks.  Pt wants to know about restarting meds. Post hospital appt set 12/30

## 2012-06-20 NOTE — Telephone Encounter (Signed)
[facility], told her to do daily fasting blood sugar. Notify if greater than 200 and continue present discharge orders per Dr. Amador Cunas. Glenda Underwood also asked okay to give pt Tylenol 325 mg 2 tablets every 4 hours PRN. Told her that would be fine. Will send order to sign off.

## 2012-06-20 NOTE — Telephone Encounter (Signed)
Daily fasting blood sugar. Notify if greater than 200;  continue present discharge orders

## 2012-07-02 ENCOUNTER — Ambulatory Visit: Payer: Medicare Other | Admitting: Internal Medicine

## 2012-07-10 ENCOUNTER — Encounter: Payer: Self-pay | Admitting: Cardiology

## 2012-07-24 ENCOUNTER — Encounter: Payer: Self-pay | Admitting: Cardiology

## 2012-07-24 ENCOUNTER — Ambulatory Visit (INDEPENDENT_AMBULATORY_CARE_PROVIDER_SITE_OTHER): Payer: Medicare Other | Admitting: Cardiology

## 2012-07-24 VITALS — BP 144/62 | HR 66 | Ht 60.0 in | Wt 143.0 lb

## 2012-07-24 DIAGNOSIS — I10 Essential (primary) hypertension: Secondary | ICD-10-CM

## 2012-07-24 DIAGNOSIS — I2510 Atherosclerotic heart disease of native coronary artery without angina pectoris: Secondary | ICD-10-CM

## 2012-07-24 MED ORDER — CLONIDINE HCL 0.2 MG PO TABS: 0.2000 mg | ORAL_TABLET | Freq: Two times a day (BID) | ORAL | Status: DC

## 2012-07-24 NOTE — Patient Instructions (Addendum)
Your physician recommends that you schedule a follow-up appointment in: 6 WEEKS WITH DR [facility]  INCREASE CLONIDINE TO 0.2 MG TWICE DAILY

## 2012-07-24 NOTE — Progress Notes (Signed)
HPI The patient presents for followup of a hospitalization in late November. I saw her at that time for what was atypical chest pain. I had suggested an outpatient stress perfusion study. However, she was readmitted with hypertensive urgency and apparent diastolic dysfunction. She did not have a stress test. She is now living in a retirement home. She actually has not been describing any chest discomfort, neck or arm discomfort. She has had no new shortness of breath, PND or orthopnea. She's not describing any palpitations, presyncope or syncope. I did review her recorded blood pressures which are done twice daily and they are typically systolics in the 180-190 range.  I did review an echocardiogram done in December which demonstrated a well preserved left ventricular systolic function. I reviewed all available hospital records.  Allergies  Allergen Reactions  . Penicillins Anaphylaxis    Current Outpatient Prescriptions  Medication Sig Dispense Refill  . aspirin 81 MG tablet Take 81 mg by mouth daily.        . cloNIDine (CATAPRES) 0.2 MG tablet Take 1 tablet (0.2 mg total) by mouth 2 (two) times daily.  180 tablet  3  . donepezil (ARICEPT) 5 MG tablet Take 5 mg by mouth at bedtime. DEMENTIA      . DULoxetine (CYMBALTA) 60 MG capsule Take 60 mg by mouth daily.      . felodipine (PLENDIL) 10 MG 24 hr tablet Take 10 mg by mouth daily.      Marland Kitchen lisinopril-hydrochlorothiazide (PRINZIDE,ZESTORETIC) 20-25 MG per tablet TAKE 1 TABLET DAILY  90 tablet  0  . meloxicam (MOBIC) 15 MG tablet Take 15 mg by mouth daily.      . metoprolol succinate (TOPROL-XL) 100 MG 24 hr tablet Take 100 mg by mouth daily. Take with or immediately following a meal.      . simvastatin (ZOCOR) 20 MG tablet Take 20 mg by mouth daily.      . solifenacin (VESICARE) 10 MG tablet Take 10 mg by mouth daily.        Past Medical History  Diagnosis Date  . CORONARY ARTERY DISEASE 07/20/2010  . DEPRESSION 07/20/2010  . DIABETES  MELLITUS, TYPE II 07/20/2010  . GERD 07/20/2010  . HYPERLIPIDEMIA 07/20/2010  . HYPERTENSION 07/20/2010  . LOW BACK PAIN 07/20/2010  . OSTEOARTHRITIS 07/20/2010  . URINARY INCONTINENCE 07/20/2010  . TIA (transient ischemic attack)   . CAD (coronary artery disease)     Stenting 2008 (no details)    Past Surgical History  Procedure Date  . Abdominal hysterectomy   . Vitrectomy   . Total knee arthroplasty   . Coronary stent placement    ROS:  As stated in the HPI and negative for all other systems.  PHYSICAL EXAM BP 144/62  Pulse 66  Ht 5' (1.524 m)  Wt 143 lb (64.864 kg)  BMI 27.93 kg/m2 GENERAL:  Well appearing HEENT:  Pupils equal round and reactive, fundi not visualized, oral mucosa unremarkable NECK:  No jugular venous distention, waveform within normal limits, carotid upstroke brisk and symmetric, no bruits, no thyromegaly LYMPHATICS:  No cervical, inguinal adenopathy LUNGS:  Clear to auscultation bilaterally BACK:  No CVA tenderness CHEST:  Unremarkable HEART:  PMI not displaced or sustained,S1 and S2 within normal limits, no S3, no S4, no clicks, no rubs, no murmurs ABD:  Flat, positive bowel sounds normal in frequency in pitch, no bruits, no rebound, no guarding, no midline pulsatile mass, no hepatomegaly, no splenomegaly EXT:  2 plus pulses throughout,  no edema, no cyanosis no clubbing SKIN:  No rashes no nodules NEURO:  Cranial nerves II through XII grossly intact, motor grossly intact throughout PSYCH:  Cognitively intact, oriented to person place and time   EKG:  NSR, rate 63, IVCD, poor anterior R wave progression.  06/08/12    ASSESSMENT AND PLAN  CAD She's not having any chest discomfort.  Her daughter who is a Engineer, civil (consulting) is with her today.  At this point I don't think further cardiovascular testing is indicated.  She will bring a report of her previous stenting which apparently happened in [place] in 2008.  HTN Her blood pressure is not well controlled. I will  increase her clonidine to 0.2 mg twice a day and she will likely need further management.  Diastolic heart failure She is supposed to be on a no added table salt diet and I reinforced this. She seems to be euvolemic. She will otherwise continue the meds as listed.

## 2012-07-27 ENCOUNTER — Telehealth: Payer: Self-pay | Admitting: Cardiology

## 2012-07-27 NOTE — Telephone Encounter (Signed)
Reviewed with Dr Antoine Poche - pt is to take 0.1 mg BID - [name] is aware and will send faxed written order to be signed next week

## 2012-07-27 NOTE — Telephone Encounter (Signed)
Per Glenda Underwood pt was only taking Clonidine 0.1 mg prn and per her report she was "not getting it that often"  She is concerned that she will be getting too much medication.  She is aware to call MD on call this weekend if the pt is having trouble have hypotension and or s/s of hypotension

## 2012-07-27 NOTE — Telephone Encounter (Signed)
New problem:   Last office visit on 1/21.    Patient has not been take the  0.1mg  Catapres .  Will explain into more details when the nurse call back.

## 2012-09-04 ENCOUNTER — Encounter: Payer: Self-pay | Admitting: Cardiology

## 2012-09-04 ENCOUNTER — Ambulatory Visit (INDEPENDENT_AMBULATORY_CARE_PROVIDER_SITE_OTHER): Payer: Medicare Other | Admitting: Cardiology

## 2012-09-04 VITALS — BP 138/68 | HR 55 | Wt 152.0 lb

## 2012-09-04 DIAGNOSIS — I10 Essential (primary) hypertension: Secondary | ICD-10-CM

## 2012-09-04 MED ORDER — TRAZODONE HCL 50 MG PO TABS: 50.0000 mg | ORAL_TABLET | ORAL | Status: DC | PRN

## 2012-09-04 MED ORDER — SIMVASTATIN 20 MG PO TABS: 20.0000 mg | ORAL_TABLET | Freq: Every day | ORAL | Status: DC

## 2012-09-04 MED ORDER — HYDRALAZINE HCL 10 MG PO TABS: 10.0000 mg | ORAL_TABLET | Freq: Two times a day (BID) | ORAL | Status: DC

## 2012-09-04 MED ORDER — TRAMADOL HCL 50 MG PO TABS: 50.0000 mg | ORAL_TABLET | ORAL | Status: DC | PRN

## 2012-09-04 MED ORDER — DULOXETINE HCL 60 MG PO CPEP: 60.0000 mg | ORAL_CAPSULE | Freq: Every day | ORAL | Status: AC

## 2012-09-04 MED ORDER — FELODIPINE ER 10 MG PO TB24: 10.0000 mg | ORAL_TABLET | Freq: Every day | ORAL | Status: DC

## 2012-09-04 MED ORDER — LISINOPRIL 20 MG PO TABS: 20.0000 mg | ORAL_TABLET | Freq: Every day | ORAL | Status: DC

## 2012-09-04 MED ORDER — METOPROLOL SUCCINATE ER 100 MG PO TB24: 100.0000 mg | ORAL_TABLET | Freq: Every day | ORAL | Status: DC

## 2012-09-04 MED ORDER — CLONIDINE HCL 0.2 MG PO TABS: 0.2000 mg | ORAL_TABLET | Freq: Two times a day (BID) | ORAL | Status: DC

## 2012-09-04 MED ORDER — MELOXICAM 15 MG PO TABS: 15.0000 mg | ORAL_TABLET | Freq: Every day | ORAL | Status: DC

## 2012-09-04 MED ORDER — LORAZEPAM 0.5 MG PO TABS: 0.5000 mg | ORAL_TABLET | ORAL | Status: DC | PRN

## 2012-09-04 MED ORDER — DONEPEZIL HCL 5 MG PO TABS: 5.0000 mg | ORAL_TABLET | Freq: Every day | ORAL | Status: DC

## 2012-09-04 MED ORDER — SOLIFENACIN SUCCINATE 5 MG PO TABS: 5.0000 mg | ORAL_TABLET | Freq: Every day | ORAL | Status: DC

## 2012-09-04 NOTE — Progress Notes (Signed)
HPI The patient presents for followup of a hospitalization in late November. I saw her at that time for what was atypical chest pain. I had suggested an outpatient stress perfusion study. However, she was readmitted with hypertensive urgency and apparent diastolic dysfunction. She did not have a stress test. An echocardiogram done in December demonstrated a well preserved left ventricular systolic function. I reviewed all available hospital records.  She is now living in a retirement home.     After the last visit there was some confusion about her medications. There was confusion at the retirement home as she had been taken off of HCTZ and was supposed to be on hydralazine but her daughter who is a nurse said she had not been getting this. Her blood pressures were elevated. These now seem to be better controlled. She's not having any chest pressure, neck or arm discomfort. She's not having any palpitations, presyncope or syncope. She actually think she feels relatively well and she likes where she is living. She does not yet have a primary Laresa Oshiro and so is having trouble getting her medications.  Allergies  Allergen Reactions  . Penicillins Anaphylaxis    Current Outpatient Prescriptions  Medication Sig Dispense Refill  . aspirin 81 MG tablet Take 81 mg by mouth daily.        . cholecalciferol (VITAMIN D) 1000 UNITS tablet Take 1,000 Units by mouth daily.      . cloNIDine (CATAPRES) 0.2 MG tablet Take 1 tablet (0.2 mg total) by mouth 2 (two) times daily.  180 tablet  3  . donepezil (ARICEPT) 5 MG tablet Take 5 mg by mouth at bedtime. DEMENTIA      . DULoxetine (CYMBALTA) 60 MG capsule Take 60 mg by mouth daily.      . felodipine (PLENDIL) 10 MG 24 hr tablet Take 10 mg by mouth daily.      . hydrALAZINE (APRESOLINE) 10 MG tablet Take 10 mg by mouth 2 (two) times daily.      Marland Kitchen lisinopril (PRINIVIL,ZESTRIL) 20 MG tablet Take 20 mg by mouth daily.      Marland Kitchen LORazepam (ATIVAN) 0.5 MG tablet Take  0.5 mg by mouth as needed for anxiety.      . meloxicam (MOBIC) 15 MG tablet Take 15 mg by mouth daily.      . metoprolol succinate (TOPROL-XL) 100 MG 24 hr tablet 1/2 tab po qd      . simvastatin (ZOCOR) 20 MG tablet Take 20 mg by mouth daily.      . solifenacin (VESICARE) 10 MG tablet Take 10 mg by mouth daily.      . traMADol (ULTRAM) 50 MG tablet Take 50 mg by mouth as needed for pain.      . traZODone (DESYREL) 50 MG tablet Take 50 mg by mouth as needed for sleep.       No current facility-administered medications for this visit.    Past Medical History  Diagnosis Date  . CORONARY ARTERY DISEASE 07/20/2010  . DEPRESSION 07/20/2010  . DIABETES MELLITUS, TYPE II 07/20/2010  . GERD 07/20/2010  . HYPERLIPIDEMIA 07/20/2010  . HYPERTENSION 07/20/2010  . LOW BACK PAIN 07/20/2010  . OSTEOARTHRITIS 07/20/2010  . URINARY INCONTINENCE 07/20/2010  . TIA (transient ischemic attack)   . CAD (coronary artery disease)     Stenting 2008 (no details)    Past Surgical History  Procedure Laterality Date  . Abdominal hysterectomy    . Vitrectomy    . Total knee  arthroplasty    . Coronary stent placement     ROS:  Overactive bladder.  Otherwise as stated in the HPI and negative for all other systems.  PHYSICAL EXAM BP 138/68  Pulse 55  Wt 152 lb (68.947 kg)  BMI 29.69 kg/m2 GENERAL:  Well appearing HEENT:  Pupils equal round and reactive, fundi not visualized, oral mucosa unremarkable NECK:  No jugular venous distention, waveform within normal limits, carotid upstroke brisk and symmetric, no bruits, no thyromegaly LUNGS:  Clear to auscultation bilaterally BACK:  No CVA tenderness CHEST:  Unremarkable HEART:  PMI not displaced or sustained,S1 and S2 within normal limits, no S3, no S4, no clicks, no rubs, no murmurs ABD:  Flat, positive bowel sounds normal in frequency in pitch, no bruits, no rebound, no guarding, no midline pulsatile mass, no hepatomegaly, no splenomegaly EXT:  2 plus pulses  throughout, no edema, no cyanosis no clubbing   ASSESSMENT AND PLAN  CAD She's not having any chest discomfort.  I  At this point I don't think further cardiovascular testing is indicated.  I am still trying to get a record of her previous stenting which apparently happened in [place] in 2008.  HTN Her blood pressure is now well controlled. She will continue the meds as listed.  Diastolic heart failure She is supposed to be on a no added table salt diet and I again reinforced this. She seems to be euvolemic. She will otherwise continue the meds as listed.  Follow up I will go ahead and renew her medications and restart Vesicare which she was on previously. However, she understands that she needs to have a primary care Lennis Rader in the future for management of non-care cardiac issues and renewal of his prescriptions.

## 2012-09-04 NOTE — Patient Instructions (Addendum)
The current medical regimen is effective;  continue present plan and medications.  Follow up in 6 months with Dr Hochrein.  You will receive a letter in the mail 2 months before you are due.  Please call us when you receive this letter to schedule your follow up appointment.  

## 2012-09-11 ENCOUNTER — Other Ambulatory Visit: Payer: Self-pay | Admitting: *Deleted

## 2012-09-11 MED ORDER — METOPROLOL SUCCINATE ER 100 MG PO TB24: ORAL_TABLET | ORAL | Status: DC

## 2012-11-05 ENCOUNTER — Other Ambulatory Visit: Payer: Self-pay | Admitting: *Deleted

## 2012-11-05 MED ORDER — SOLIFENACIN SUCCINATE 5 MG PO TABS: 5.0000 mg | ORAL_TABLET | Freq: Every day | ORAL | Status: DC

## 2012-11-05 NOTE — Telephone Encounter (Signed)
Patient's daughter calling to see if Dr Antoine Poche will refill Tramadol again for more than once daily. Daughter states patient was previously taking Tramadol every 8 hrs daily.  Will forward to nurse for approval or refusal.  Micki Riley, CMA

## 2012-11-05 NOTE — Telephone Encounter (Signed)
Patient's daughter calling back because she was told Glenda Underwood is not covered in her mother's insurance plan and there is an alternative they [employer]) would rather do. Daughter would like a call back to speak with nurse changing her mother's medication. Will route to nurse   Micki Riley, CMA

## 2012-12-05 ENCOUNTER — Other Ambulatory Visit: Payer: Self-pay | Admitting: Cardiology

## 2013-02-18 ENCOUNTER — Emergency Department (HOSPITAL_COMMUNITY): Payer: Medicare Other

## 2013-02-18 ENCOUNTER — Encounter (HOSPITAL_COMMUNITY): Payer: Self-pay | Admitting: Nurse Practitioner

## 2013-02-18 ENCOUNTER — Emergency Department (HOSPITAL_COMMUNITY)
Admission: EM | Admit: 2013-02-18 | Discharge: 2013-02-18 | Disposition: A | Discharge status: Skilled Nursing Facility | Payer: Medicare Other | Attending: Emergency Medicine | Admitting: Emergency Medicine

## 2013-02-18 DIAGNOSIS — F329 Major depressive disorder, single episode, unspecified: Secondary | ICD-10-CM | POA: Insufficient documentation

## 2013-02-18 DIAGNOSIS — I10 Essential (primary) hypertension: Secondary | ICD-10-CM | POA: Insufficient documentation

## 2013-02-18 DIAGNOSIS — Z7982 Long term (current) use of aspirin: Secondary | ICD-10-CM | POA: Insufficient documentation

## 2013-02-18 DIAGNOSIS — E119 Type 2 diabetes mellitus without complications: Secondary | ICD-10-CM | POA: Insufficient documentation

## 2013-02-18 DIAGNOSIS — E785 Hyperlipidemia, unspecified: Secondary | ICD-10-CM | POA: Insufficient documentation

## 2013-02-18 DIAGNOSIS — Z87448 Personal history of other diseases of urinary system: Secondary | ICD-10-CM | POA: Insufficient documentation

## 2013-02-18 DIAGNOSIS — F3289 Other specified depressive episodes: Secondary | ICD-10-CM | POA: Insufficient documentation

## 2013-02-18 DIAGNOSIS — Z8673 Personal history of transient ischemic attack (TIA), and cerebral infarction without residual deficits: Secondary | ICD-10-CM | POA: Insufficient documentation

## 2013-02-18 DIAGNOSIS — F17200 Nicotine dependence, unspecified, uncomplicated: Secondary | ICD-10-CM | POA: Insufficient documentation

## 2013-02-18 DIAGNOSIS — Z8739 Personal history of other diseases of the musculoskeletal system and connective tissue: Secondary | ICD-10-CM | POA: Insufficient documentation

## 2013-02-18 DIAGNOSIS — Q788 Other specified osteochondrodysplasias: Secondary | ICD-10-CM | POA: Insufficient documentation

## 2013-02-18 DIAGNOSIS — Z79899 Other long term (current) drug therapy: Secondary | ICD-10-CM | POA: Insufficient documentation

## 2013-02-18 DIAGNOSIS — Z8719 Personal history of other diseases of the digestive system: Secondary | ICD-10-CM | POA: Insufficient documentation

## 2013-02-18 DIAGNOSIS — I2510 Atherosclerotic heart disease of native coronary artery without angina pectoris: Secondary | ICD-10-CM | POA: Insufficient documentation

## 2013-02-18 DIAGNOSIS — F0390 Unspecified dementia without behavioral disturbance: Secondary | ICD-10-CM | POA: Insufficient documentation

## 2013-02-18 DIAGNOSIS — R0789 Other chest pain: Secondary | ICD-10-CM

## 2013-02-18 DIAGNOSIS — M1990 Unspecified osteoarthritis, unspecified site: Secondary | ICD-10-CM | POA: Insufficient documentation

## 2013-02-18 DIAGNOSIS — Z880 Allergy status to penicillin: Secondary | ICD-10-CM | POA: Insufficient documentation

## 2013-02-18 HISTORY — DX: Hypokalemia: E87.6

## 2013-02-18 HISTORY — DX: Unspecified dementia, unspecified severity, without behavioral disturbance, psychotic disturbance, mood disturbance, and anxiety: F03.90

## 2013-02-18 LAB — CBC WITH DIFFERENTIAL/PLATELET
Basophils Relative: 0 % (ref 0–1)
HCT: 38.1 % (ref 36.0–46.0)
Hemoglobin: 13.1 g/dL (ref 12.0–15.0)
Lymphs Abs: 2.6 10*3/uL (ref 0.7–4.0)
MCH: 29 pg (ref 26.0–34.0)
MCHC: 34.4 g/dL (ref 30.0–36.0)
Monocytes Absolute: 0.8 10*3/uL (ref 0.1–1.0)
Monocytes Relative: 9 % (ref 3–12)
Neutro Abs: 5.4 10*3/uL (ref 1.7–7.7)

## 2013-02-18 LAB — URINALYSIS, ROUTINE W REFLEX MICROSCOPIC
Bilirubin Urine: NEGATIVE
Ketones, ur: NEGATIVE mg/dL
Leukocytes, UA: NEGATIVE
Nitrite: NEGATIVE
Protein, ur: NEGATIVE mg/dL
Urobilinogen, UA: 0.2 mg/dL (ref 0.0–1.0)
pH: 6 (ref 5.0–8.0)

## 2013-02-18 LAB — COMPREHENSIVE METABOLIC PANEL
Albumin: 3.4 g/dL — ABNORMAL LOW (ref 3.5–5.2)
BUN: 12 mg/dL (ref 6–23)
Chloride: 107 mEq/L (ref 96–112)
Creatinine, Ser: 0.7 mg/dL (ref 0.50–1.10)
GFR calc Af Amer: >90 mL/min (ref >90)
GFR calc non Af Amer: 81 mL/min — ABNORMAL LOW (ref >90)
Glucose, Bld: 120 mg/dL — ABNORMAL HIGH (ref 70–99)
Total Bilirubin: 0.4 mg/dL (ref 0.3–1.2)

## 2013-02-18 LAB — LIPASE, BLOOD: Lipase: 30 U/L (ref 11–59)

## 2013-02-18 LAB — TROPONIN I: Troponin I: <0.3 ng/mL (ref <0.30)

## 2013-02-18 NOTE — ED Notes (Signed)
Per EMS 45 min ago pt has sudden onset of sharp, left sided CP 7/10 while resting. nonradiating denies n/v SOB HAS HAD 324mg  ASA PO. Has had dry, non productive block. Hx. CAD, HTN, DM type 2, EKG- Left bundle branch block

## 2013-02-18 NOTE — ED Notes (Signed)
Report called to facility

## 2013-02-18 NOTE — ED Provider Notes (Signed)
CSN: [id number]     Arrival date & time 02/18/13  1253 History     First MD Initiated Contact with Patient 02/18/13 1254     Chief Complaint  Patient presents with  . Chest Pain   (Consider location/radiation/quality/duration/timing/severity/associated sxs/prior Treatment) HPI Pt with history of  CAD and dementia lives in assisted living facility. She was transported by EMS after c/o 10 min of non-radiating central chest pain. Described as sharp. Spontaneously resolved. No recent hospitalizations or surgeries. No lower ext swelling or pain. +dry cough. No SOB.  Past Medical History  Diagnosis Date  . CORONARY ARTERY DISEASE 07/20/2010  . DEPRESSION 07/20/2010  . DIABETES MELLITUS, TYPE II 07/20/2010  . GERD 07/20/2010  . HYPERLIPIDEMIA 07/20/2010  . HYPERTENSION 07/20/2010  . LOW BACK PAIN 07/20/2010  . OSTEOARTHRITIS 07/20/2010  . URINARY INCONTINENCE 07/20/2010  . TIA (transient ischemic attack)   . CAD (coronary artery disease)     Stenting 2008 (no details)  . Dementia   . Hypokalemia    Past Surgical History  Procedure Laterality Date  . Abdominal hysterectomy    . Vitrectomy    . Total knee arthroplasty    . Coronary stent placement     Family History  Problem Relation Age of Onset  . Hypertension Mother    History  Substance Use Topics  . Smoking status: Current Every Day Smoker -- 0.50 packs/day    Types: Cigarettes  . Smokeless tobacco: Never Used     Comment: smokes 2-3 per day  . Alcohol Use: No   OB History   Grav Para Term Preterm Abortions TAB SAB Ect Mult Living                 Review of Systems  Constitutional: Negative for fever and chills.  HENT: Positive for neck pain.   Respiratory: Positive for cough. Negative for shortness of breath and wheezing.   Cardiovascular: Positive for chest pain. Negative for palpitations and leg swelling.  Gastrointestinal: Negative for nausea, vomiting, abdominal pain and diarrhea.  Musculoskeletal: Negative for  myalgias and back pain.  Skin: Negative for rash and wound.  Neurological: Negative for dizziness, weakness, light-headedness, numbness and headaches.  All other systems reviewed and are negative.    Allergies  Penicillins  Home Medications   Current Outpatient Rx  Name  Route  Sig  Dispense  Refill  . acetaminophen (TYLENOL) 325 MG tablet   Oral   Take 650 mg by mouth every 4 (four) hours as needed for pain.         Marland Kitchen aspirin 81 MG tablet   Oral   Take 81 mg by mouth daily.           . cholecalciferol (VITAMIN D) 1000 UNITS tablet   Oral   Take 1,000 Units by mouth daily.         . cloNIDine (CATAPRES) 0.2 MG tablet   Oral   Take 1 tablet (0.2 mg total) by mouth 2 (two) times daily.   180 tablet   3   . donepezil (ARICEPT) 5 MG tablet   Oral   Take 1 tablet (5 mg total) by mouth at bedtime. DEMENTIA   90 tablet   0   . felodipine (PLENDIL) 10 MG 24 hr tablet   Oral   Take 1 tablet (10 mg total) by mouth daily.   90 tablet   3   . hydrALAZINE (APRESOLINE) 50 MG tablet   Oral   Take 50  mg by mouth 3 (three) times daily.         Marland Kitchen lisinopril (PRINIVIL,ZESTRIL) 20 MG tablet   Oral   Take 1 tablet (20 mg total) by mouth daily.   90 tablet   3   . LORazepam (ATIVAN) 0.5 MG tablet   Oral   Take 0.5 mg by mouth every 4 (four) hours as needed for anxiety (agitation).         . metoprolol succinate (TOPROL-XL) 100 MG 24 hr tablet      1/2 tab po qd   90 tablet   3   . sertraline (ZOLOFT) 25 MG tablet   Oral   Take 25 mg by mouth daily.         . simvastatin (ZOCOR) 20 MG tablet   Oral   Take 1 tablet (20 mg total) by mouth daily.   90 tablet   3   . DULoxetine (CYMBALTA) 60 MG capsule   Oral   Take 1 capsule (60 mg total) by mouth daily.   90 capsule   0   . traZODone (DESYREL) 50 MG tablet   Oral   Take 1 tablet (50 mg total) by mouth as needed for sleep.   90 tablet   0    BP 164/65  Pulse 57  Temp(Src) 98.2 F (36.8 C)  (Oral)  Resp 16  SpO2 98% Physical Exam  Nursing note and vitals reviewed. Constitutional: She is oriented to person, place, and time. She appears well-developed and well-nourished. No distress.  HENT:  Head: Normocephalic and atraumatic.  Mouth/Throat: Oropharynx is clear and moist.  Eyes: EOM are normal. Pupils are equal, round, and reactive to light.  Neck: Normal range of motion. Neck supple.  Cardiovascular: Normal rate and regular rhythm.   Pulmonary/Chest: Effort normal and breath sounds normal. No respiratory distress. She has no wheezes. She has no rales. She exhibits no tenderness.  Abdominal: Soft. Bowel sounds are normal. She exhibits no distension and no mass. There is no tenderness. There is no rebound and no guarding.  Musculoskeletal: Normal range of motion. She exhibits no edema and no tenderness.  Neurological: She is alert and oriented to person, place, and time.  Mild confusion. No focal motor or sensory deficits.    Skin: Skin is warm and dry. No rash noted. No erythema.  Psychiatric: She has a normal mood and affect. Her behavior is normal.    ED Course   Procedures (including critical care time)  Labs Reviewed  COMPREHENSIVE METABOLIC PANEL - Abnormal; Notable for the following:    Glucose, Bld 120 (*)    Albumin 3.4 (*)    GFR calc non Af Amer 81 (*)    All other components within normal limits  CBC WITH DIFFERENTIAL  LIPASE, BLOOD  URINALYSIS, ROUTINE W REFLEX MICROSCOPIC  TROPONIN I   Dg Chest 2 View  02/18/2013   *RADIOLOGY REPORT*  Clinical Data: Chest pain.  CHEST - 2 VIEW  Comparison: PA and lateral chest 06/08/2012  Findings: There is cardiomegaly without edema. Lungs clear. Heart size is enlarged. No pneumothorax or pleural effusion.  IMPRESSION: Cardiomegaly without acute disease.   Original Report Authenticated By: Holley Dexter, M.D.   1. Atypical chest pain     Date: 02/18/2013  Rate: 59  Rhythm: normal sinus rhythm  QRS Axis:  normal  Intervals: QRS prolonged  ST/T Wave abnormalities: nonspecific ST changes  Conduction Disutrbances:left bundle branch block  Narrative Interpretation:   Old EKG Reviewed:  unchanged   MDM  Symptoms atypical for CAD. Workup negative. Advised f/u with her cardiologist and retun precautions given.   Loren Racer, MD 02/19/13 [phone]

## 2013-02-18 NOTE — ED Notes (Signed)
Pt anxious, ready to leave. Given graham crackers and coke. Per pt request

## 2013-02-22 ENCOUNTER — Ambulatory Visit: Payer: Medicare Other | Admitting: Cardiology

## 2013-04-04 ENCOUNTER — Ambulatory Visit (INDEPENDENT_AMBULATORY_CARE_PROVIDER_SITE_OTHER): Payer: Medicare Other | Admitting: Cardiology

## 2013-04-04 ENCOUNTER — Encounter: Payer: Self-pay | Admitting: Cardiology

## 2013-04-04 VITALS — BP 139/70 | HR 60 | Ht 60.0 in | Wt 143.0 lb

## 2013-04-04 DIAGNOSIS — I5031 Acute diastolic (congestive) heart failure: Secondary | ICD-10-CM

## 2013-04-04 DIAGNOSIS — I509 Heart failure, unspecified: Secondary | ICD-10-CM

## 2013-04-04 DIAGNOSIS — I2510 Atherosclerotic heart disease of native coronary artery without angina pectoris: Secondary | ICD-10-CM

## 2013-04-04 NOTE — Progress Notes (Signed)
HPI The patient presents for followup of chest pain. She has a history of coronary disease with stenting in [place] previously but I never had these records. Last year she had negative stress test.  An echocardiogram done in December demonstrated a well preserved left ventricular systolic function. She was seen in August in the emergency room for chest discomfort. I have reviewed these records. These were felt to be atypical and she was not admitted to the hospital.  The patient does have a memory problem. She lives at assisted living. She does not recall any recent symptoms. She ambulates with a walker to the dining hall and does not bring on any chest pressure, neck or arm discomfort. She doesn't have any palpitations, presyncope or syncope. She has had no PND or orthopnea.   Allergies  Allergen Reactions  . Penicillins Anaphylaxis    Current Outpatient Prescriptions  Medication Sig Dispense Refill  . acetaminophen (TYLENOL) 325 MG tablet Take 650 mg by mouth every 4 (four) hours as needed for pain.      Marland Kitchen aspirin 81 MG tablet Take 81 mg by mouth daily.        . cholecalciferol (VITAMIN D) 1000 UNITS tablet Take 1,000 Units by mouth daily.      . cloNIDine (CATAPRES) 0.2 MG tablet Take 1 tablet (0.2 mg total) by mouth 2 (two) times daily.  180 tablet  3  . donepezil (ARICEPT) 5 MG tablet Take 1 tablet (5 mg total) by mouth at bedtime. DEMENTIA  90 tablet  0  . DULoxetine (CYMBALTA) 60 MG capsule Take 1 capsule (60 mg total) by mouth daily.  90 capsule  0  . felodipine (PLENDIL) 10 MG 24 hr tablet Take 1 tablet (10 mg total) by mouth daily.  90 tablet  3  . hydrALAZINE (APRESOLINE) 50 MG tablet Take 50 mg by mouth 3 (three) times daily.      Marland Kitchen lisinopril (PRINIVIL,ZESTRIL) 20 MG tablet Take 1 tablet (20 mg total) by mouth daily.  90 tablet  3  . LORazepam (ATIVAN) 0.5 MG tablet Take 0.5 mg by mouth every 4 (four) hours as needed for anxiety (agitation).      . metoprolol succinate  (TOPROL-XL) 100 MG 24 hr tablet 1/2 tab po qd  90 tablet  3  . simvastatin (ZOCOR) 20 MG tablet Take 1 tablet (20 mg total) by mouth daily.  90 tablet  3  . traZODone (DESYREL) 50 MG tablet Take 1 tablet (50 mg total) by mouth as needed for sleep.  90 tablet  0   No current facility-administered medications for this visit.    Past Medical History  Diagnosis Date  . CORONARY ARTERY DISEASE 07/20/2010  . DEPRESSION 07/20/2010  . DIABETES MELLITUS, TYPE II 07/20/2010  . GERD 07/20/2010  . HYPERLIPIDEMIA 07/20/2010  . HYPERTENSION 07/20/2010  . LOW BACK PAIN 07/20/2010  . OSTEOARTHRITIS 07/20/2010  . URINARY INCONTINENCE 07/20/2010  . TIA (transient ischemic attack)   . CAD (coronary artery disease)     Stenting 2008 (no details)  . Dementia   . Hypokalemia     Past Surgical History  Procedure Laterality Date  . Abdominal hysterectomy    . Vitrectomy    . Total knee arthroplasty    . Coronary stent placement     ROS:  Overactive bladder.  Otherwise as stated in the HPI and negative for all other systems.  PHYSICAL EXAM BP 139/70  Pulse 60  Ht 5' (1.524 m)  Wt 143  lb (64.864 kg)  BMI 27.93 kg/m2 GENERAL:  Well appearing HEENT:  Pupils equal round and reactive, fundi not visualized, oral mucosa unremarkable NECK:  No jugular venous distention, waveform within normal limits, carotid upstroke brisk and symmetric, no bruits, no thyromegaly LUNGS:  Clear to auscultation bilaterally BACK:  No CVA tenderness CHEST:  Unremarkable HEART:  PMI not displaced or sustained,S1 and S2 within normal limits, no S3, no S4, no clicks, no rubs, no murmurs ABD:  Flat, positive bowel sounds normal in frequency in pitch, no bruits, no rebound, no guarding, no midline pulsatile mass, no hepatomegaly, no splenomegaly EXT:  2 plus pulses throughout, no edema, no cyanosis no clubbing  EKG:  Sinus rhythm, rate 60, interventricular conduction delay, old anteroseptal infarct, QTC prolonged, rightward axis, no  acute ST-T wave changes. No change from previous.  04/04/2013   ASSESSMENT AND PLAN  CAD The patient has no new sypmtoms.  No further cardiovascular testing is indicated.  We will continue with aggressive risk reduction and meds as listed.  She has had none of the symptoms that took her to the emergency room in August.  (ER records and results reviewed.)  HTN Her blood pressure is now well controlled. She will continue the meds as listed.  Diastolic heart failure She seems to be euvolemic.  At this point, no change in therapy is indicated.  We have reviewed salt and fluid restrictions.  No further cardiovascular testing is indicated.

## 2013-04-04 NOTE — Patient Instructions (Addendum)
The current medical regimen is effective;  continue present plan and medications.  Follow up in 6 months with Dr Hochrein.  You will receive a letter in the mail 2 months before you are due.  Please call us when you receive this letter to schedule your follow up appointment.  

## 2013-08-16 ENCOUNTER — Encounter (HOSPITAL_COMMUNITY): Payer: Self-pay | Admitting: Emergency Medicine

## 2013-08-16 ENCOUNTER — Inpatient Hospital Stay (HOSPITAL_COMMUNITY): Payer: Medicare Other

## 2013-08-16 ENCOUNTER — Emergency Department (HOSPITAL_COMMUNITY): Payer: Medicare Other

## 2013-08-16 ENCOUNTER — Inpatient Hospital Stay (HOSPITAL_COMMUNITY)
Admission: EM | Admit: 2013-08-16 | Discharge: 2013-08-18 | DRG: 871 | Disposition: A | Discharge status: Nursing Home (Includes ICF) | Payer: Medicare Other | Attending: Internal Medicine | Admitting: Internal Medicine

## 2013-08-16 DIAGNOSIS — J441 Chronic obstructive pulmonary disease with (acute) exacerbation: Secondary | ICD-10-CM | POA: Diagnosis present

## 2013-08-16 DIAGNOSIS — I2510 Atherosclerotic heart disease of native coronary artery without angina pectoris: Secondary | ICD-10-CM | POA: Diagnosis present

## 2013-08-16 DIAGNOSIS — R0602 Shortness of breath: Secondary | ICD-10-CM

## 2013-08-16 DIAGNOSIS — R079 Chest pain, unspecified: Secondary | ICD-10-CM

## 2013-08-16 DIAGNOSIS — N390 Urinary tract infection, site not specified: Secondary | ICD-10-CM

## 2013-08-16 DIAGNOSIS — Z7982 Long term (current) use of aspirin: Secondary | ICD-10-CM

## 2013-08-16 DIAGNOSIS — E876 Hypokalemia: Secondary | ICD-10-CM

## 2013-08-16 DIAGNOSIS — I5032 Chronic diastolic (congestive) heart failure: Secondary | ICD-10-CM

## 2013-08-16 DIAGNOSIS — J9600 Acute respiratory failure, unspecified whether with hypoxia or hypercapnia: Secondary | ICD-10-CM | POA: Diagnosis present

## 2013-08-16 DIAGNOSIS — Z96659 Presence of unspecified artificial knee joint: Secondary | ICD-10-CM

## 2013-08-16 DIAGNOSIS — F329 Major depressive disorder, single episode, unspecified: Secondary | ICD-10-CM

## 2013-08-16 DIAGNOSIS — Z8249 Family history of ischemic heart disease and other diseases of the circulatory system: Secondary | ICD-10-CM

## 2013-08-16 DIAGNOSIS — F17200 Nicotine dependence, unspecified, uncomplicated: Secondary | ICD-10-CM | POA: Diagnosis present

## 2013-08-16 DIAGNOSIS — Z833 Family history of diabetes mellitus: Secondary | ICD-10-CM

## 2013-08-16 DIAGNOSIS — E119 Type 2 diabetes mellitus without complications: Secondary | ICD-10-CM

## 2013-08-16 DIAGNOSIS — J9601 Acute respiratory failure with hypoxia: Secondary | ICD-10-CM

## 2013-08-16 DIAGNOSIS — G8929 Other chronic pain: Secondary | ICD-10-CM | POA: Diagnosis present

## 2013-08-16 DIAGNOSIS — F0390 Unspecified dementia without behavioral disturbance: Secondary | ICD-10-CM | POA: Diagnosis present

## 2013-08-16 DIAGNOSIS — Z9861 Coronary angioplasty status: Secondary | ICD-10-CM

## 2013-08-16 DIAGNOSIS — Z8673 Personal history of transient ischemic attack (TIA), and cerebral infarction without residual deficits: Secondary | ICD-10-CM

## 2013-08-16 DIAGNOSIS — I5031 Acute diastolic (congestive) heart failure: Secondary | ICD-10-CM

## 2013-08-16 DIAGNOSIS — F411 Generalized anxiety disorder: Secondary | ICD-10-CM | POA: Diagnosis present

## 2013-08-16 DIAGNOSIS — A419 Sepsis, unspecified organism: Principal | ICD-10-CM | POA: Diagnosis present

## 2013-08-16 DIAGNOSIS — T380X5A Adverse effect of glucocorticoids and synthetic analogues, initial encounter: Secondary | ICD-10-CM | POA: Diagnosis present

## 2013-08-16 DIAGNOSIS — E785 Hyperlipidemia, unspecified: Secondary | ICD-10-CM

## 2013-08-16 DIAGNOSIS — J189 Pneumonia, unspecified organism: Secondary | ICD-10-CM | POA: Diagnosis present

## 2013-08-16 DIAGNOSIS — R404 Transient alteration of awareness: Secondary | ICD-10-CM | POA: Diagnosis present

## 2013-08-16 DIAGNOSIS — F1999 Other psychoactive substance use, unspecified with unspecified psychoactive substance-induced disorder: Secondary | ICD-10-CM | POA: Diagnosis present

## 2013-08-16 DIAGNOSIS — D72829 Elevated white blood cell count, unspecified: Secondary | ICD-10-CM | POA: Diagnosis present

## 2013-08-16 DIAGNOSIS — J45901 Unspecified asthma with (acute) exacerbation: Secondary | ICD-10-CM

## 2013-08-16 DIAGNOSIS — F3289 Other specified depressive episodes: Secondary | ICD-10-CM | POA: Diagnosis present

## 2013-08-16 DIAGNOSIS — IMO0002 Reserved for concepts with insufficient information to code with codable children: Secondary | ICD-10-CM

## 2013-08-16 DIAGNOSIS — E1149 Type 2 diabetes mellitus with other diabetic neurological complication: Secondary | ICD-10-CM | POA: Diagnosis present

## 2013-08-16 DIAGNOSIS — R000 Tachycardia, unspecified: Secondary | ICD-10-CM | POA: Diagnosis present

## 2013-08-16 DIAGNOSIS — I10 Essential (primary) hypertension: Secondary | ICD-10-CM | POA: Diagnosis present

## 2013-08-16 DIAGNOSIS — R4182 Altered mental status, unspecified: Secondary | ICD-10-CM

## 2013-08-16 DIAGNOSIS — F32A Depression, unspecified: Secondary | ICD-10-CM

## 2013-08-16 DIAGNOSIS — K219 Gastro-esophageal reflux disease without esophagitis: Secondary | ICD-10-CM

## 2013-08-16 DIAGNOSIS — F05 Delirium due to known physiological condition: Secondary | ICD-10-CM

## 2013-08-16 DIAGNOSIS — E860 Dehydration: Secondary | ICD-10-CM | POA: Diagnosis present

## 2013-08-16 DIAGNOSIS — R32 Unspecified urinary incontinence: Secondary | ICD-10-CM

## 2013-08-16 HISTORY — DX: Type 2 diabetes mellitus with other diabetic neurological complication: E11.49

## 2013-08-16 HISTORY — DX: Asymptomatic varicose veins of unspecified lower extremity: I83.90

## 2013-08-16 HISTORY — DX: Spinal stenosis, site unspecified: M48.00

## 2013-08-16 HISTORY — DX: Tobacco use: Z72.0

## 2013-08-16 LAB — BASIC METABOLIC PANEL
BUN: 14 mg/dL (ref 6–23)
CO2: 21 mEq/L (ref 19–32)
Calcium: 9.1 mg/dL (ref 8.4–10.5)
Chloride: 99 mEq/L (ref 96–112)
Creatinine, Ser: 0.67 mg/dL (ref 0.50–1.10)
GFR calc Af Amer: >90 mL/min (ref >90)
GFR calc non Af Amer: 82 mL/min — ABNORMAL LOW (ref >90)
Glucose, Bld: 280 mg/dL — ABNORMAL HIGH (ref 70–99)
Potassium: 3.7 mEq/L (ref 3.7–5.3)
Sodium: 138 mEq/L (ref 137–147)

## 2013-08-16 LAB — CBC WITH DIFFERENTIAL/PLATELET
Basophils Absolute: 0 10*3/uL (ref 0.0–0.1)
Basophils Relative: 0 % (ref 0–1)
Eosinophils Absolute: 0.1 10*3/uL (ref 0.0–0.7)
Eosinophils Relative: 0 % (ref 0–5)
HCT: 41.4 % (ref 36.0–46.0)
Hemoglobin: 14.2 g/dL (ref 12.0–15.0)
Lymphocytes Relative: 6 % — ABNORMAL LOW (ref 12–46)
Lymphs Abs: 1.1 10*3/uL (ref 0.7–4.0)
MCH: 28.9 pg (ref 26.0–34.0)
MCHC: 34.3 g/dL (ref 30.0–36.0)
MCV: 84.3 fL (ref 78.0–100.0)
Monocytes Absolute: 0.9 10*3/uL (ref 0.1–1.0)
Monocytes Relative: 5 % (ref 3–12)
Neutro Abs: 15.1 10*3/uL — ABNORMAL HIGH (ref 1.7–7.7)
Neutrophils Relative %: 88 % — ABNORMAL HIGH (ref 43–77)
Platelets: 237 10*3/uL (ref 150–400)
RBC: 4.91 MIL/uL (ref 3.87–5.11)
RDW: 13.8 % (ref 11.5–15.5)
WBC: 17.2 10*3/uL — ABNORMAL HIGH (ref 4.0–10.5)

## 2013-08-16 LAB — TROPONIN I: Troponin I: <0.3 ng/mL (ref <0.30)

## 2013-08-16 LAB — BLOOD GAS, ARTERIAL
Acid-base deficit: 5.6 mmol/L — ABNORMAL HIGH (ref 0.0–2.0)
Bicarbonate: 19.1 mEq/L — ABNORMAL LOW (ref 20.0–24.0)
DRAWN BY: 11249
O2 Content: 5 L/min
O2 SAT: 92.2 %
PATIENT TEMPERATURE: 98.6
PH ART: 7.337 — AB (ref 7.350–7.450)
TCO2: 16.9 mmol/L (ref 0–100)
pCO2 arterial: 36.6 mmHg (ref 35.0–45.0)
pO2, Arterial: 68.2 mmHg — ABNORMAL LOW (ref 80.0–100.0)

## 2013-08-16 LAB — URINALYSIS, ROUTINE W REFLEX MICROSCOPIC
Bilirubin Urine: NEGATIVE
Glucose, UA: >1000 mg/dL — AB
Ketones, ur: NEGATIVE mg/dL
Leukocytes, UA: NEGATIVE
Nitrite: NEGATIVE
Protein, ur: 100 mg/dL — AB
Specific Gravity, Urine: 1.017 (ref 1.005–1.030)
Urobilinogen, UA: 0.2 mg/dL (ref 0.0–1.0)
pH: 6.5 (ref 5.0–8.0)

## 2013-08-16 LAB — GLUCOSE, CAPILLARY
GLUCOSE-CAPILLARY: 148 mg/dL — AB (ref 70–99)
Glucose-Capillary: 283 mg/dL — ABNORMAL HIGH (ref 70–99)

## 2013-08-16 LAB — URINE MICROSCOPIC-ADD ON

## 2013-08-16 LAB — CG4 I-STAT (LACTIC ACID): LACTIC ACID, VENOUS: 2.77 mmol/L — AB (ref 0.5–2.2)

## 2013-08-16 MED ORDER — LEVOFLOXACIN IN D5W 750 MG/150ML IV SOLN
750.0000 mg | Freq: Once | INTRAVENOUS | Status: DC
  Filled 2013-08-16: qty 150

## 2013-08-16 MED ORDER — TRAZODONE HCL 50 MG PO TABS
50.0000 mg | ORAL_TABLET | Freq: Every evening | ORAL | Status: DC | PRN
  Filled 2013-08-16: qty 1

## 2013-08-16 MED ORDER — GUAIFENESIN-DM 100-10 MG/5ML PO SYRP
10.0000 mL | ORAL_SOLUTION | ORAL | Status: DC | PRN
  Administered 2013-08-18: 10 mL via ORAL
  Filled 2013-08-16: qty 10

## 2013-08-16 MED ORDER — METOPROLOL SUCCINATE ER 50 MG PO TB24
50.0000 mg | ORAL_TABLET | Freq: Every day | ORAL | Status: DC
  Administered 2013-08-16 – 2013-08-17 (×2): 50 mg via ORAL
  Filled 2013-08-16 (×3): qty 1

## 2013-08-16 MED ORDER — ACETAMINOPHEN 325 MG PO TABS: 650.0000 mg | ORAL_TABLET | ORAL | Status: DC | PRN

## 2013-08-16 MED ORDER — ENOXAPARIN SODIUM 40 MG/0.4ML ~~LOC~~ SOLN
40.0000 mg | SUBCUTANEOUS | Status: DC
  Administered 2013-08-16 – 2013-08-17 (×2): 40 mg via SUBCUTANEOUS
  Filled 2013-08-16 (×3): qty 0.4

## 2013-08-16 MED ORDER — PREDNISONE 20 MG PO TABS
40.0000 mg | ORAL_TABLET | Freq: Every day | ORAL | Status: DC
  Administered 2013-08-16 – 2013-08-18 (×2): 40 mg via ORAL
  Filled 2013-08-16 (×3): qty 2

## 2013-08-16 MED ORDER — ASPIRIN EC 81 MG PO TBEC
81.0000 mg | DELAYED_RELEASE_TABLET | Freq: Every day | ORAL | Status: DC
  Administered 2013-08-17 – 2013-08-18 (×2): 81 mg via ORAL
  Filled 2013-08-16 (×2): qty 1

## 2013-08-16 MED ORDER — LISINOPRIL 20 MG PO TABS
20.0000 mg | ORAL_TABLET | Freq: Every day | ORAL | Status: DC
Start: 2013-08-17 — End: 2013-08-18
  Administered 2013-08-17 – 2013-08-18 (×2): 20 mg via ORAL
  Filled 2013-08-16 (×2): qty 1

## 2013-08-16 MED ORDER — LORAZEPAM 0.5 MG PO TABS
0.5000 mg | ORAL_TABLET | ORAL | Status: DC | PRN
  Administered 2013-08-16 – 2013-08-18 (×2): 0.5 mg via ORAL
  Filled 2013-08-16 (×2): qty 1

## 2013-08-16 MED ORDER — SIMVASTATIN 20 MG PO TABS
20.0000 mg | ORAL_TABLET | Freq: Every day | ORAL | Status: DC
  Administered 2013-08-16 – 2013-08-17 (×2): 20 mg via ORAL
  Filled 2013-08-16 (×3): qty 1

## 2013-08-16 MED ORDER — TRAMADOL HCL 50 MG PO TABS: 50.0000 mg | ORAL_TABLET | Freq: Four times a day (QID) | ORAL | Status: DC | PRN

## 2013-08-16 MED ORDER — SODIUM CHLORIDE 0.9 % IV BOLUS (SEPSIS)
500.0000 mL | INTRAVENOUS | Status: AC
  Administered 2013-08-16: 500 mL via INTRAVENOUS

## 2013-08-16 MED ORDER — DULOXETINE HCL 60 MG PO CPEP
60.0000 mg | ORAL_CAPSULE | Freq: Every day | ORAL | Status: DC
  Administered 2013-08-17 – 2013-08-18 (×2): 60 mg via ORAL
  Filled 2013-08-16 (×2): qty 1

## 2013-08-16 MED ORDER — MEMANTINE HCL ER 28 MG PO CP24: 28.0000 mg | ORAL_CAPSULE | Freq: Every day | ORAL | Status: DC

## 2013-08-16 MED ORDER — MEMANTINE HCL ER 7 MG PO CP24
28.0000 mg | ORAL_CAPSULE | Freq: Every morning | ORAL | Status: DC
  Administered 2013-08-17 – 2013-08-18 (×2): 28 mg via ORAL
  Filled 2013-08-16 (×2): qty 4

## 2013-08-16 MED ORDER — LORAZEPAM 0.5 MG PO TABS
0.5000 mg | ORAL_TABLET | Freq: Three times a day (TID) | ORAL | Status: DC
  Administered 2013-08-16 – 2013-08-17 (×4): 0.5 mg via ORAL
  Filled 2013-08-16 (×4): qty 1

## 2013-08-16 MED ORDER — FELODIPINE ER 10 MG PO TB24
10.0000 mg | ORAL_TABLET | Freq: Every day | ORAL | Status: DC
  Administered 2013-08-17 – 2013-08-18 (×2): 10 mg via ORAL
  Filled 2013-08-16 (×2): qty 1

## 2013-08-16 MED ORDER — DOXYCYCLINE HYCLATE 100 MG PO TABS
100.0000 mg | ORAL_TABLET | Freq: Two times a day (BID) | ORAL | Status: DC
  Administered 2013-08-16 – 2013-08-18 (×5): 100 mg via ORAL
  Filled 2013-08-16 (×6): qty 1

## 2013-08-16 MED ORDER — IPRATROPIUM BROMIDE 0.02 % IN SOLN
0.5000 mg | Freq: Once | RESPIRATORY_TRACT | Status: AC
  Administered 2013-08-16: 0.5 mg via RESPIRATORY_TRACT
  Filled 2013-08-16: qty 2.5

## 2013-08-16 MED ORDER — SODIUM CHLORIDE 0.9 % IV SOLN
INTRAVENOUS | Status: AC
  Administered 2013-08-16: 19:00:00 via INTRAVENOUS

## 2013-08-16 MED ORDER — ALBUTEROL SULFATE (2.5 MG/3ML) 0.083% IN NEBU
5.0000 mg | INHALATION_SOLUTION | Freq: Once | RESPIRATORY_TRACT | Status: AC
  Administered 2013-08-16: 5 mg via RESPIRATORY_TRACT
  Filled 2013-08-16: qty 6

## 2013-08-16 MED ORDER — INSULIN ASPART 100 UNIT/ML ~~LOC~~ SOLN
0.0000 [IU] | Freq: Every day | SUBCUTANEOUS | Status: DC
  Administered 2013-08-16: 3 [IU] via SUBCUTANEOUS

## 2013-08-16 MED ORDER — DEXTROSE 5 % IV SOLN: 500.0000 mg | Freq: Once | INTRAVENOUS | Status: DC

## 2013-08-16 MED ORDER — BENZONATATE 100 MG PO CAPS
100.0000 mg | ORAL_CAPSULE | Freq: Three times a day (TID) | ORAL | Status: DC
  Administered 2013-08-16 – 2013-08-18 (×5): 100 mg via ORAL
  Filled 2013-08-16 (×7): qty 1

## 2013-08-16 MED ORDER — VITAMIN D3 25 MCG (1000 UNIT) PO TABS
1000.0000 [IU] | ORAL_TABLET | Freq: Every day | ORAL | Status: DC
  Administered 2013-08-17 – 2013-08-18 (×2): 1000 [IU] via ORAL
  Filled 2013-08-16 (×2): qty 1

## 2013-08-16 MED ORDER — DEXTROMETHORPHAN-GUAIFENESIN 10-100 MG/5ML PO LIQD: 10.0000 mL | ORAL | Status: DC | PRN

## 2013-08-16 MED ORDER — INSULIN ASPART 100 UNIT/ML ~~LOC~~ SOLN
0.0000 [IU] | Freq: Three times a day (TID) | SUBCUTANEOUS | Status: DC
  Administered 2013-08-16: 2 [IU] via SUBCUTANEOUS
  Administered 2013-08-17: 11 [IU] via SUBCUTANEOUS
  Administered 2013-08-17: 8 [IU] via SUBCUTANEOUS

## 2013-08-16 MED ORDER — HYDRALAZINE HCL 20 MG/ML IJ SOLN
10.0000 mg | INTRAMUSCULAR | Status: DC | PRN
  Administered 2013-08-16: 10 mg via INTRAVENOUS
  Filled 2013-08-16: qty 0.5

## 2013-08-16 MED ORDER — ONDANSETRON HCL 4 MG PO TABS: 4.0000 mg | ORAL_TABLET | Freq: Four times a day (QID) | ORAL | Status: DC | PRN

## 2013-08-16 MED ORDER — IPRATROPIUM-ALBUTEROL 0.5-2.5 (3) MG/3ML IN SOLN
3.0000 mL | RESPIRATORY_TRACT | Status: DC
  Administered 2013-08-16 – 2013-08-17 (×7): 3 mL via RESPIRATORY_TRACT
  Filled 2013-08-16 (×7): qty 3

## 2013-08-16 MED ORDER — HYDRALAZINE HCL 50 MG PO TABS
50.0000 mg | ORAL_TABLET | Freq: Four times a day (QID) | ORAL | Status: DC
  Administered 2013-08-16 – 2013-08-18 (×6): 50 mg via ORAL
  Filled 2013-08-16 (×9): qty 1

## 2013-08-16 MED ORDER — CLONIDINE HCL 0.3 MG PO TABS
0.3000 mg | ORAL_TABLET | Freq: Two times a day (BID) | ORAL | Status: DC
  Administered 2013-08-16 – 2013-08-18 (×4): 0.3 mg via ORAL
  Filled 2013-08-16 (×5): qty 1

## 2013-08-16 NOTE — ED Notes (Signed)
Pt daughter concerned with side effects of levaquin and request another medication option. PA Lawyer made aware and reports best option is levaquin. Pt daughter refuses medication at present time. PA aware. Hospitalist at bedside addressing pt/family concerns.

## 2013-08-16 NOTE — ED Notes (Signed)
Bed: WA17 Expected date:  Expected time:  Means of arrival:  Comments: EMS-cough 

## 2013-08-16 NOTE — Progress Notes (Addendum)
[facility] progress note. Chief complaint. Respiratory distress. History of present illness. This 80 year old female in hospital with sepsis DD community-acquired pneumonia. There is a history of congestive heart failure the patient currently does not appear to be volume overloaded. Patient ambulated to the bathroom and a return to the bed was noted to have marked tachypnea and desats. Placed the bed and given when necessary Ativan for anxiety. I was called to the bedside along with rapid response. A portable chest x-ray was obtained indicating ill-defined consolidation right lung base concerning for bronchial pneumonia. Also some vascular congestion but no frank pulmonary edema. We decided to place the patient on Ventimask based on arterial blood gas showing pH 7.33, PCO2 36.6, PO2 68.2, bicarbonate 19.1. Once on Ventimask the patient's respiratory status improved significantly with decreased respiratory rate and improved O2 sats. Patient denies any chest pain. Vital signs. Temperature 98.2, pulse 97, respiration 25, blood pressure 165/57. O2 sats 97%. General appearance. Frail thin elderly female who is alert with mild respiratory distress. Cardiac. Rate and rhythm regular. 2/6 systolic ejection murmur. Lungs. Bronchial sounds in the bases but otherwise clear. No crackles appreciated. No wheezing noted. Impression/plan. Problem #1. Respiratory distress. Probably multifactorial but primarily pneumonia. Doesn't appear as if congestive heart failure telling an active role currently. Patient appears clinically much more stable on Ventimask. Problem #2. Coronary artery disease. An EKG was obtained by rapid response and mild ST elevation in V2-3. This appears to be present in 2014 EKG. We'll obtain troponin now and then every 6 hours x3 events and follow for results  Addendum: Patient's second troponin has resulted mildly elevated 0.38. Still no complaints of chest pain. Discussed with cardiology and  did not feel the patient would require anticoagulation with heparin at this time. Cardiology did request a repeat echocardiogram and I have placed disorder. Third troponin is still pending. I will repeat an EKG to evaluate for any new changes.

## 2013-08-16 NOTE — ED Notes (Addendum)
Pt. Ambulated with no assist. Pt. Stated that she felt a little dizzy while walking. Pt. Oxygen level was 95% and Pulse was 88 while walking. Nurse was notified.

## 2013-08-16 NOTE — Progress Notes (Signed)
Called to room 1511 at 1950 for pt in respiratory distress following trip to bathroom with RN assist.. Pt admitted for CAP, hx copd smoker. Upon my arrival pt in bed, SOB, pursed lip breathing on 6 L [place]. O2 sats 91-93, rr 40s. CXR , ABG ordered stat. Pt placed on RRT monitor and EKG obtained for hr 140s, yielding ST. Lungs course with wheezing and diminished crackles at  Bases. Benedetto Coons Callahan NP called to room arrived 2020. ABG results and EKG  provided to NP. VM 50% placed on pt. At 2030 pt rr improving, bp trending down after hydralizine IV. Pt placed on floor continuous pulse ox and tele box. Orders received to maintain bedrest tonight and monitor closely.

## 2013-08-16 NOTE — ED Provider Notes (Signed)
CSN: [id number]     Arrival date & time 08/16/13  [id number] History   First MD Initiated Contact with Patient 08/16/13 [phone]     Chief Complaint  Patient presents with  . Cough     (Consider location/radiation/quality/duration/timing/severity/associated sxs/prior Treatment) HPI Patient presents to the emergency department with cough and wheezing with fever.  Patient, states, that the symptoms started 2 days, ago.  The patient denies nausea, vomiting, headache, blurred vision, weakness, dizziness, back pain, dysuria, abdominal pain, chest pain, syncope or rash. The patient states that she did not take any medications for her symptoms. The patient states that she did not see her PCP. Past Medical History  Diagnosis Date  . CORONARY ARTERY DISEASE 07/20/2010    with stent  . DEPRESSION 07/20/2010  . DM (diabetes mellitus) type II controlled, neurological manifestation 07/20/2010  . GERD 07/20/2010  . HYPERLIPIDEMIA 07/20/2010  . HYPERTENSION 07/20/2010  . LOW BACK PAIN 07/20/2010  . OSTEOARTHRITIS 07/20/2010  . URINARY INCONTINENCE 07/20/2010  . TIA (transient ischemic attack)   . CAD (coronary artery disease)     Stenting 2008 (no details)  . Dementia   . Hypokalemia   . Spinal stenosis   . Tobacco abuse   . Varicose vein of leg    Past Surgical History  Procedure Laterality Date  . Abdominal hysterectomy    . Vitrectomy Left   . Total knee arthroplasty    . Coronary stent placement    . Varicose vein surgery     Family History  Problem Relation Age of Onset  . Hypertension Mother   . Diabetes Mother   . Breast cancer Sister    History  Substance Use Topics  . Smoking status: Current Every Day Smoker -- 0.50 packs/day    Types: Cigarettes  . Smokeless tobacco: Never Used     Comment: smokes 2-3 per day  . Alcohol Use: No   OB History   Grav Para Term Preterm Abortions TAB SAB Ect Mult Living                 Review of Systems All other systems negative except as documented  in the HPI. All pertinent positives and negatives as reviewed in the HPI.    Allergies  Penicillins  Home Medications   Current Outpatient Rx  Name  Route  Sig  Dispense  Refill  . acetaminophen (TYLENOL) 325 MG tablet   Oral   Take 650 mg by mouth every 4 (four) hours as needed for pain.         Marland Kitchen. aspirin 81 MG tablet   Oral   Take 81 mg by mouth daily.          . cholecalciferol (VITAMIN D) 1000 UNITS tablet   Oral   Take 1,000 Units by mouth daily.         . cloNIDine (CATAPRES) 0.2 MG tablet   Oral   Take 0.3 mg by mouth 2 (two) times daily. Takes 1.5 tablets         . dextromethorphan-guaiFENesin (ROBITUSSIN-DM) 10-100 MG/5ML liquid   Oral   Take 10 mLs by mouth every 4 (four) hours as needed for cough.         . DULoxetine (CYMBALTA) 60 MG capsule   Oral   Take 1 capsule (60 mg total) by mouth daily.   90 capsule   0   . felodipine (PLENDIL) 10 MG 24 hr tablet   Oral   Take 1 tablet (10  mg total) by mouth daily.   90 tablet   3   . hydrALAZINE (APRESOLINE) 50 MG tablet   Oral   Take 50 mg by mouth 4 (four) times daily.          Marland Kitchen lisinopril (PRINIVIL,ZESTRIL) 20 MG tablet   Oral   Take 1 tablet (20 mg total) by mouth daily.   90 tablet   3   . LORazepam (ATIVAN) 0.5 MG tablet   Oral   Take 0.5 mg by mouth 3 (three) times daily.          Marland Kitchen LORazepam (ATIVAN) 0.5 MG tablet   Oral   Take 0.5 mg by mouth every 4 (four) hours as needed for anxiety. Not to be given within 4 hours of scheduled dose         . Memantine HCl ER (NAMENDA XR) 28 MG CP24   Oral   Take 28 mg by mouth daily.         . metoprolol succinate (TOPROL-XL) 100 MG 24 hr tablet   Oral   Take 50 mg by mouth daily. At 5pm. Hold if pulse is less than 50 Take with or immediately following a meal.         . ondansetron (ZOFRAN) 4 MG tablet   Oral   Take 4 mg by mouth every 6 (six) hours as needed for nausea or vomiting.         . simvastatin (ZOCOR) 20 MG  tablet   Oral   Take 1 tablet (20 mg total) by mouth daily.   90 tablet   3   . traMADol (ULTRAM) 50 MG tablet   Oral   Take 50 mg by mouth every 6 (six) hours as needed (pain).         . traZODone (DESYREL) 50 MG tablet   Oral   Take 1 tablet (50 mg total) by mouth as needed for sleep.   90 tablet   0    BP 145/58  Pulse 79  Temp(Src) 98.5 F (36.9 C) (Oral)  Resp 20  SpO2 95% Physical Exam  Nursing note and vitals reviewed. Constitutional: She is oriented to person, place, and time. She appears well-developed and well-nourished. No distress.  HENT:  Head: Normocephalic and atraumatic.  Mouth/Throat: Oropharynx is clear and moist.  Eyes: EOM are normal. Pupils are equal, round, and reactive to light.  Neck: Normal range of motion. Neck supple.  Cardiovascular: Normal rate, regular rhythm and normal heart sounds.  Exam reveals no gallop and no friction rub.   No murmur heard. Pulmonary/Chest: Effort normal. No accessory muscle usage. No respiratory distress. She has no decreased breath sounds. She has wheezes. She has rhonchi. She has no rales.  Neurological: She is alert and oriented to person, place, and time.  Skin: Skin is warm and dry.    ED Course  Procedures (including critical care time) Labs Review Labs Reviewed  CBC WITH DIFFERENTIAL - Abnormal; Notable for the following:    WBC 17.2 (*)    Neutrophils Relative % 88 (*)    Neutro Abs 15.1 (*)    Lymphocytes Relative 6 (*)    All other components within normal limits  BASIC METABOLIC PANEL - Abnormal; Notable for the following:    Glucose, Bld 280 (*)    GFR calc non Af Amer 82 (*)    All other components within normal limits  URINALYSIS, ROUTINE W REFLEX MICROSCOPIC - Abnormal; Notable for the following:  Glucose, UA >1000 (*)    Hgb urine dipstick TRACE (*)    Protein, ur 100 (*)    All other components within normal limits  CG4 I-STAT (LACTIC ACID) - Abnormal; Notable for the following:     Lactic Acid, Venous 2.77 (*)    All other components within normal limits  URINE MICROSCOPIC-ADD ON  I-STAT CG4 LACTIC ACID, ED   Imaging Review Dg Chest 2 View  08/16/2013   CLINICAL DATA:  Cough, congestion  EXAM: CHEST  2 VIEW  COMPARISON:  02/18/2013  FINDINGS: Borderline cardiomegaly. Degenerative changes thoracic spine again noted. No acute infiltrate or pulmonary edema. Central mild bronchitic changes.  IMPRESSION: No acute infiltrate or pulmonary edema. Central mild bronchitic changes.   Electronically Signed   By: Natasha Mead M.D.   On: 08/16/2013 10:25    Patient will be admitted for further care and eval. Patient advised of the plan.   Carlyle Dolly, PA-C 08/19/13 [phone]

## 2013-08-16 NOTE — Progress Notes (Signed)
Notified that patient's daughter refusing levofloxacin due to side effects of joint pain, tendon problems.  Daughter states that her mother was put on amoxicillin a few years ago without incident, however, according to our notes, the patient has anaphylaxis to PCN.  Daughter and patient requested monotherapy medication because "she already takes too many pills."  Recommended doxycycline as alternative given Ms. Malen GauzeFoster looks clinically well.

## 2013-08-16 NOTE — H&P (Addendum)
[facility] History and Physical  Glenda Underwood ZOX:[id number] DOB: 1933-03-18 DOA: 08/16/2013  Referring physician: Charlestine Night PCP:  Rogelia Boga, MD   Chief Complaint:  Cough, wheezing  HPI:  The patient is a 80 y.o. year-old female with history of CAD, T2DM, chronic pain from arthritis and spinal stenosis, possible COPD with ongoing tobacco use, TIA and dementia who presents with cough and SOB.  The patient was last at their baseline health several days ago.  She resides at [facility] and ambulated with walker.  She is very social and functional.    She states that last week, she developed sinus congestion, rhinorrhea which has progressed to cough productive of phlegm which she has been swallowing, SOB with exertion, and wheezing.  She has had subjective fevers, however, her ALF did not record any fevers.  She denies pain, nausea, vomiting, diarrhea, constipation, and dysuria.    In the ER, HR initially 120s, T 99.59F, BP 208/80 which promptly decreased to 140-150 range.  Oxygen saturations in the low 90s and decreased to 88% with ambulation.  WBC 17.2, BMP normal except for glucose of 280.  CXR demonstrated no acute infiltrate, but ER physician stated she was consistently diminished and crackly at the right base.  She was wheezing and received a duoneb.  Given doxycycline monotherapy for CAP.    Review of Systems:  General:  Subjective fevers, no weight loss or gain HEENT:  Denies changes to hearing and vision, rhinorrhea, sinus congestion, sore throat CV:  Denies chest pain and palpitations, lower extremity edema.  PULM:  +SOB, wheezing, cough.   GI:  Denies nausea, vomiting, constipation, diarrhea.   GU:  Denies dysuria, frequency, urgency ENDO:  Denies polyuria, polydipsia.   HEME:  Denies hematemesis, blood in stools, melena, abnormal bruising or bleeding.  LYMPH:  Denies lymphadenopathy.   MSK:  chronic arthralgias, myalgias.   DERM:  Denies skin rash or  ulcer.   NEURO:  Denies focal numbness, weakness, slurred speech, facial droop.  Chronic confusion PSYCH:  Denies anxiety and depression.    Past Medical History  Diagnosis Date  . CORONARY ARTERY DISEASE 07/20/2010    with stent  . DEPRESSION 07/20/2010  . DM (diabetes mellitus) type II controlled, neurological manifestation 07/20/2010  . GERD 07/20/2010  . HYPERLIPIDEMIA 07/20/2010  . HYPERTENSION 07/20/2010  . LOW BACK PAIN 07/20/2010  . OSTEOARTHRITIS 07/20/2010  . URINARY INCONTINENCE 07/20/2010  . TIA (transient ischemic attack)   . CAD (coronary artery disease)     Stenting 2008 (no details)  . Dementia   . Hypokalemia   . Spinal stenosis   . Tobacco abuse   . Varicose vein of leg    Past Surgical History  Procedure Laterality Date  . Abdominal hysterectomy    . Vitrectomy Left   . Total knee arthroplasty    . Coronary stent placement    . Varicose vein surgery     Social History:  reports that she has been smoking Cigarettes.  She has been smoking about 0.50 packs per day. She has never used smokeless tobacco. She reports that she does not drink alcohol or use illicit drugs. She lives in [facility] and ambulates with walker.    Allergies  Allergen Reactions  . Penicillins Anaphylaxis    Family History  Problem Relation Age of Onset  . Hypertension Mother   . Diabetes Mother   . Breast cancer Sister      Prior to Admission medications   Medication  Sig Start Date End Date Taking? Authorizing Provider  acetaminophen (TYLENOL) 325 MG tablet Take 650 mg by mouth every 4 (four) hours as needed for pain.   Yes Historical Provider, MD  aspirin 81 MG tablet Take 81 mg by mouth daily.    Yes Historical Provider, MD  cholecalciferol (VITAMIN D) 1000 UNITS tablet Take 1,000 Units by mouth daily.   Yes Historical Provider, MD  cloNIDine (CATAPRES) 0.2 MG tablet Take 0.3 mg by mouth 2 (two) times daily. Takes 1.5 tablets   Yes Historical Provider, MD   dextromethorphan-guaiFENesin (ROBITUSSIN-DM) 10-100 MG/5ML liquid Take 10 mLs by mouth every 4 (four) hours as needed for cough.   Yes Historical Provider, MD  DULoxetine (CYMBALTA) 60 MG capsule Take 1 capsule (60 mg total) by mouth daily. 09/04/12  Yes Rollene Rotunda, MD  felodipine (PLENDIL) 10 MG 24 hr tablet Take 1 tablet (10 mg total) by mouth daily. 09/04/12  Yes Rollene Rotunda, MD  hydrALAZINE (APRESOLINE) 50 MG tablet Take 50 mg by mouth 4 (four) times daily.    Yes Historical Provider, MD  lisinopril (PRINIVIL,ZESTRIL) 20 MG tablet Take 1 tablet (20 mg total) by mouth daily. 09/04/12  Yes Rollene Rotunda, MD  LORazepam (ATIVAN) 0.5 MG tablet Take 0.5 mg by mouth 3 (three) times daily.  09/04/12  Yes Rollene Rotunda, MD  LORazepam (ATIVAN) 0.5 MG tablet Take 0.5 mg by mouth every 4 (four) hours as needed for anxiety. Not to be given within 4 hours of scheduled dose   Yes Historical Provider, MD  Memantine HCl ER (NAMENDA XR) 28 MG CP24 Take 28 mg by mouth daily.   Yes Historical Provider, MD  metoprolol succinate (TOPROL-XL) 100 MG 24 hr tablet Take 50 mg by mouth daily. At 5pm. Hold if pulse is less than 50 Take with or immediately following a meal.   Yes Historical Provider, MD  ondansetron (ZOFRAN) 4 MG tablet Take 4 mg by mouth every 6 (six) hours as needed for nausea or vomiting.   Yes Historical Provider, MD  simvastatin (ZOCOR) 20 MG tablet Take 1 tablet (20 mg total) by mouth daily. 09/04/12  Yes Rollene Rotunda, MD  traMADol (ULTRAM) 50 MG tablet Take 50 mg by mouth every 6 (six) hours as needed (pain).   Yes Historical Provider, MD  traZODone (DESYREL) 50 MG tablet Take 1 tablet (50 mg total) by mouth as needed for sleep. 09/04/12  Yes Rollene Rotunda, MD   Physical Exam: Filed Vitals:   08/16/13 1630 08/16/13 1633 08/16/13 1638 08/16/13 1653  BP: 157/76   182/71  Pulse:  79 78 97  Temp:    98.2 F (36.8 C)  TempSrc:    Oral  Resp:  22 21 20   Height:    5' (1.524 m)  SpO2:  95% 94% 95%      General:  CF, NAD  Eyes:  PERRL, anicteric, non-injected.  ENT:  Nares clear.  OP clear, non-erythematous without plaques or exudates.  MMM.  Neck:  Supple without TM or JVD.    Lymph:  No cervical, supraclavicular, or submandibular LAD.  Cardiovascular:  RRR, normal S1, S2, without m/r/g.  2+ pulses, warm extremities  Respiratory:  Full expiratory wheeze and rales at bilateral bases, no rhonchi, without increased WOB.  Abdomen:  NABS.  Soft, ND/NT.    Skin:  No rashes or focal lesions.  Musculoskeletal:  Normal bulk and tone.  No LE edema.  Psychiatric:  A & O x 4.  Appropriate affect.  Neurologic:  CN 3-12 intact.  5/5 strength.  Sensation intact.  Labs on Admission:  Basic Metabolic Panel:  Recent Labs Lab 08/16/13 1045  NA 138  K 3.7  CL 99  CO2 21  GLUCOSE 280*  BUN 14  CREATININE 0.67  CALCIUM 9.1   Liver Function Tests: No results found for this basename: AST, ALT, ALKPHOS, BILITOT, PROT, ALBUMIN,  in the last 168 hours No results found for this basename: LIPASE, AMYLASE,  in the last 168 hours No results found for this basename: AMMONIA,  in the last 168 hours CBC:  Recent Labs Lab 08/16/13 1045  WBC 17.2*  NEUTROABS 15.1*  HGB 14.2  HCT 41.4  MCV 84.3  PLT 237   Cardiac Enzymes: No results found for this basename: CKTOTAL, CKMB, CKMBINDEX, TROPONINI,  in the last 168 hours  BNP (last 3 results) No results found for this basename: PROBNP,  in the last 8760 hours CBG:  Recent Labs Lab 08/16/13 1759  GLUCAP 148*    Radiological Exams on Admission: Dg Chest 2 View  08/16/2013   CLINICAL DATA:  Cough, congestion  EXAM: CHEST  2 VIEW  COMPARISON:  02/18/2013  FINDINGS: Borderline cardiomegaly. Degenerative changes thoracic spine again noted. No acute infiltrate or pulmonary edema. Central mild bronchitic changes.  IMPRESSION: No acute infiltrate or pulmonary edema. Central mild bronchitic changes.   Electronically Signed   By: Natasha MeadLiviu   Pop M.D.   On: 08/16/2013 10:25    EKG:  pending  Assessment/Plan Principal Problem:   CAP (community acquired pneumonia) Active Problems:   DIABETES MELLITUS, TYPE II   DEPRESSION   HYPERTENSION   CORONARY ARTERY DISEASE   Dementia   COPD with acute exacerbation   Chronic diastolic heart failure   Sepsis  ---  Sepsis due to CAP (leukocytosis and tachycardia) -  Blood cultures not obtained in ER -  UA unremarkable -  Doxycycline monotherapy -  Check legionella, S. Pneumo, and flu PCR  Wheezing is likely due to reactive airway disease from smoking in the setting of pneumonia, possible acute copd exacerbation.  She does have grade 1 chronic diastolic heart failure, however, she appears dehydrated currently and CXR not suggestive of vascular congestion.   -  Prednisone 40mg  daily -  duonebs -  abx as above -  Consider lasix if not improving otherwise -  Tessalon TID and guaifenesin prn  T2DM, monitor for hyperglycemia on steroids -  A1c -  Start SSI  CAD/HTN/HLD, BP elevated -  Continue ASA, clonidine, felodipine, hydralazine, lisinopril, and metoprolol -  Continue simvastatin  Depression/anxiety, stable.  Continue ativan   Dementia, stable, continue memantine  Chronic pain, stable.  Continue cymbalta and prn tramadol  Leukocytosis due to dehydration and possible pneumonia -  IVF and abx as above -  Repeat WBC in AM  Elevated lactic acid likely due to dehydration, no abdominal pain - IVF -  Check electrolytes in AM  Diet:  diabetic Access:  PIV IVF:  yes Proph:  lovenox  Code Status: Full Family Communication: to patient and daughter Disposition Plan: Admit to med-surg.  Discharge home in AM if walking pulse ox improved.  May need temporary insulin while on prednisone.  Time spent: 60 min Renae FickleSHORT, Ryane Canavan [facility] Pager [phone]  If 7PM-7AM, please contact night-coverage [email or web address] Password [email or web address] 08/16/2013, 6:46 PM

## 2013-08-16 NOTE — ED Notes (Addendum)
[facility]3 days. Pt cough nonproductive. Staff at facility report fever but 98.1 en route with EMS. Pt denies SOB, n/v/d.

## 2013-08-16 NOTE — Progress Notes (Addendum)
RN called for RT stat. Patient was in respiratory distress after walking to the bathroom. RT gave breathing treatment and obtained an ABG. ABG results were PH 7.33......CO2 was 36.6.......and O2 was 68%. Rapid response at bedside.

## 2013-08-17 DIAGNOSIS — I5032 Chronic diastolic (congestive) heart failure: Secondary | ICD-10-CM

## 2013-08-17 DIAGNOSIS — I369 Nonrheumatic tricuspid valve disorder, unspecified: Secondary | ICD-10-CM

## 2013-08-17 DIAGNOSIS — R7989 Other specified abnormal findings of blood chemistry: Secondary | ICD-10-CM

## 2013-08-17 DIAGNOSIS — J9601 Acute respiratory failure with hypoxia: Secondary | ICD-10-CM

## 2013-08-17 DIAGNOSIS — J9600 Acute respiratory failure, unspecified whether with hypoxia or hypercapnia: Secondary | ICD-10-CM

## 2013-08-17 LAB — HEMOGLOBIN A1C
Hgb A1c MFr Bld: 7.2 % — ABNORMAL HIGH (ref <5.7)
MEAN PLASMA GLUCOSE: 160 mg/dL — AB (ref <117)

## 2013-08-17 LAB — CBC
HEMATOCRIT: 37.3 % (ref 36.0–46.0)
Hemoglobin: 12.6 g/dL (ref 12.0–15.0)
MCH: 28.5 pg (ref 26.0–34.0)
MCHC: 33.8 g/dL (ref 30.0–36.0)
MCV: 84.4 fL (ref 78.0–100.0)
PLATELETS: 231 10*3/uL (ref 150–400)
RBC: 4.42 MIL/uL (ref 3.87–5.11)
RDW: 14 % (ref 11.5–15.5)
WBC: 17.3 10*3/uL — AB (ref 4.0–10.5)

## 2013-08-17 LAB — TROPONIN I: Troponin I: 0.38 ng/mL (ref <0.30)

## 2013-08-17 LAB — BASIC METABOLIC PANEL
BUN: 17 mg/dL (ref 6–23)
CHLORIDE: 105 meq/L (ref 96–112)
CO2: 21 mEq/L (ref 19–32)
CREATININE: 0.72 mg/dL (ref 0.50–1.10)
Calcium: 8.9 mg/dL (ref 8.4–10.5)
GFR calc non Af Amer: 80 mL/min — ABNORMAL LOW (ref >90)
Glucose, Bld: 282 mg/dL — ABNORMAL HIGH (ref 70–99)
Potassium: 4.1 mEq/L (ref 3.7–5.3)
Sodium: 140 mEq/L (ref 137–147)

## 2013-08-17 LAB — GLUCOSE, CAPILLARY
GLUCOSE-CAPILLARY: 336 mg/dL — AB (ref 70–99)
GLUCOSE-CAPILLARY: 111 mg/dL — AB (ref 70–99)
Glucose-Capillary: 261 mg/dL — ABNORMAL HIGH (ref 70–99)
Glucose-Capillary: 241 mg/dL — ABNORMAL HIGH (ref 70–99)

## 2013-08-17 LAB — LEGIONELLA ANTIGEN, URINE: LEGIONELLA ANTIGEN, URINE: NEGATIVE

## 2013-08-17 LAB — INFLUENZA PANEL BY PCR (TYPE A & B)
H1N1 flu by pcr: NOT DETECTED
INFLBPCR: NEGATIVE
Influenza A By PCR: NEGATIVE

## 2013-08-17 LAB — STREP PNEUMONIAE URINARY ANTIGEN: Strep Pneumo Urinary Antigen: NEGATIVE

## 2013-08-17 MED ORDER — INSULIN ASPART 100 UNIT/ML ~~LOC~~ SOLN: 3.0000 [IU] | Freq: Three times a day (TID) | SUBCUTANEOUS | Status: DC

## 2013-08-17 MED ORDER — INSULIN GLARGINE 100 UNIT/ML ~~LOC~~ SOLN
10.0000 [IU] | Freq: Every day | SUBCUTANEOUS | Status: DC
  Administered 2013-08-17: 10 [IU] via SUBCUTANEOUS
  Filled 2013-08-17 (×2): qty 0.1

## 2013-08-17 MED ORDER — INSULIN ASPART 100 UNIT/ML ~~LOC~~ SOLN
5.0000 [IU] | Freq: Three times a day (TID) | SUBCUTANEOUS | Status: DC
  Administered 2013-08-18: 09:00:00 via SUBCUTANEOUS

## 2013-08-17 MED ORDER — INSULIN ASPART 100 UNIT/ML ~~LOC~~ SOLN: 0.0000 [IU] | Freq: Three times a day (TID) | SUBCUTANEOUS | Status: DC

## 2013-08-17 MED ORDER — IPRATROPIUM-ALBUTEROL 0.5-2.5 (3) MG/3ML IN SOLN
3.0000 mL | Freq: Four times a day (QID) | RESPIRATORY_TRACT | Status: DC
  Administered 2013-08-18 (×2): 3 mL via RESPIRATORY_TRACT
  Filled 2013-08-17 (×2): qty 3

## 2013-08-17 NOTE — Progress Notes (Signed)
CRITICAL VALUE ALERT  Critical value received: Troponin 0.38  Date of notification:  08/17/2013  Time of notification:0410  Critical value read back:yes  Nurse who received alert: Brayton Cavesom Orvile Corona     MD notified (1st page):  [phone]  Time of first page:  0415  MD notified (2nd page):  Time of second page:  Responding MD: Lenny Pastelom Callahan  Time MD responded:  [phone]

## 2013-08-17 NOTE — Consult Note (Signed)
CONSULT NOTE  Date: 08/17/2013               Patient Name:  Glenda Underwood MRN: [id number]  DOB: [birth year] Age / Sex: 80 y.o., female        PCP: Rogelia Boga Primary Cardiologist: Antoine Poche            Referring Physician: Judie Petit. Short. MD              Reason for Consult: + troponins in the setting of pneumonia / sepsis           History of Present Illness: Patient is a 80 y.o. female with a PMHx of CAD, s/p stenting, DM, hyperlipidemia, COPD ( ongoing cigarette smoking)  dementiamild diastolic dysfunction , who was admitted to [facility] on 08/16/2013 for evaluation of sinus congestion, dyspnea, wheezing.  She presented with fever, elevated WBC, hyperglycemia.   CXR shows RLL and RML pneumonia, mild cardiomegaly.    Medications: Outpatient medications: Prescriptions prior to admission  Medication Sig Dispense Refill  . acetaminophen (TYLENOL) 325 MG tablet Take 650 mg by mouth every 4 (four) hours as needed for pain.      Marland Kitchen aspirin 81 MG tablet Take 81 mg by mouth daily.       . cholecalciferol (VITAMIN D) 1000 UNITS tablet Take 1,000 Units by mouth daily.      . cloNIDine (CATAPRES) 0.2 MG tablet Take 0.3 mg by mouth 2 (two) times daily. Takes 1.5 tablets      . dextromethorphan-guaiFENesin (ROBITUSSIN-DM) 10-100 MG/5ML liquid Take 10 mLs by mouth every 4 (four) hours as needed for cough.      . DULoxetine (CYMBALTA) 60 MG capsule Take 1 capsule (60 mg total) by mouth daily.  90 capsule  0  . felodipine (PLENDIL) 10 MG 24 hr tablet Take 1 tablet (10 mg total) by mouth daily.  90 tablet  3  . hydrALAZINE (APRESOLINE) 50 MG tablet Take 50 mg by mouth 4 (four) times daily.       Marland Kitchen lisinopril (PRINIVIL,ZESTRIL) 20 MG tablet Take 1 tablet (20 mg total) by mouth daily.  90 tablet  3  . LORazepam (ATIVAN) 0.5 MG tablet Take 0.5 mg by mouth 3 (three) times daily.       Marland Kitchen LORazepam (ATIVAN) 0.5 MG tablet Take 0.5 mg by mouth every 4 (four) hours as needed for anxiety. Not to be  given within 4 hours of scheduled dose      . Memantine HCl ER (NAMENDA XR) 28 MG CP24 Take 28 mg by mouth daily.      . metoprolol succinate (TOPROL-XL) 100 MG 24 hr tablet Take 50 mg by mouth daily. At 5pm. Hold if pulse is less than 50 Take with or immediately following a meal.      . ondansetron (ZOFRAN) 4 MG tablet Take 4 mg by mouth every 6 (six) hours as needed for nausea or vomiting.      . simvastatin (ZOCOR) 20 MG tablet Take 1 tablet (20 mg total) by mouth daily.  90 tablet  3  . traMADol (ULTRAM) 50 MG tablet Take 50 mg by mouth every 6 (six) hours as needed (pain).      . traZODone (DESYREL) 50 MG tablet Take 1 tablet (50 mg total) by mouth as needed for sleep.  90 tablet  0    Current medications: Current Facility-Administered Medications  Medication Dose Route Frequency Provider Last Rate Last Dose  . acetaminophen (TYLENOL)  tablet 650 mg  650 mg Oral Q4H PRN Renae FickleMackenzie Short, MD      . aspirin EC tablet 81 mg  81 mg Oral Daily Renae FickleMackenzie Short, MD      . benzonatate (TESSALON) capsule 100 mg  100 mg Oral TID Renae FickleMackenzie Short, MD   100 mg at 08/16/13 2109  . cholecalciferol (VITAMIN D) tablet 1,000 Units  1,000 Units Oral Daily Renae FickleMackenzie Short, MD      . cloNIDine (CATAPRES) tablet 0.3 mg  0.3 mg Oral BID Renae FickleMackenzie Short, MD   0.3 mg at 08/16/13 2109  . doxycycline (VIBRA-TABS) tablet 100 mg  100 mg Oral Q12H Renae FickleMackenzie Short, MD   100 mg at 08/16/13 2109  . DULoxetine (CYMBALTA) DR capsule 60 mg  60 mg Oral Daily Renae FickleMackenzie Short, MD      . enoxaparin (LOVENOX) injection 40 mg  40 mg Subcutaneous Q24H Renae FickleMackenzie Short, MD   40 mg at 08/16/13 2109  . felodipine (PLENDIL) 24 hr tablet 10 mg  10 mg Oral Daily Renae FickleMackenzie Short, MD      . guaiFENesin-dextromethorphan (ROBITUSSIN DM) 100-10 MG/5ML syrup 10 mL  10 mL Oral Q4H PRN Renae FickleMackenzie Short, MD      . hydrALAZINE (APRESOLINE) injection 10 mg  10 mg Intravenous Q4H PRN Renae FickleMackenzie Short, MD   10 mg at 08/16/13 2011  . hydrALAZINE  (APRESOLINE) tablet 50 mg  50 mg Oral QID Renae FickleMackenzie Short, MD   50 mg at 08/16/13 2110  . insulin aspart (novoLOG) injection 0-15 Units  0-15 Units Subcutaneous TID WC Renae FickleMackenzie Short, MD   2 Units at 08/16/13 1841  . insulin aspart (novoLOG) injection 0-5 Units  0-5 Units Subcutaneous QHS Renae FickleMackenzie Short, MD   3 Units at 08/16/13 2314  . ipratropium-albuterol (DUONEB) 0.5-2.5 (3) MG/3ML nebulizer solution 3 mL  3 mL Nebulization Q4H WA Renae FickleMackenzie Short, MD   3 mL at 08/17/13 0441  . lisinopril (PRINIVIL,ZESTRIL) tablet 20 mg  20 mg Oral Daily Renae FickleMackenzie Short, MD      . LORazepam (ATIVAN) tablet 0.5 mg  0.5 mg Oral TID Renae FickleMackenzie Short, MD   0.5 mg at 08/16/13 2110  . LORazepam (ATIVAN) tablet 0.5 mg  0.5 mg Oral Q4H PRN Renae FickleMackenzie Short, MD   0.5 mg at 08/16/13 1958  . Memantine HCl ER CP24 28 mg  28 mg Oral q morning - 10a Renae FickleMackenzie Short, MD      . metoprolol succinate (TOPROL-XL) 24 hr tablet 50 mg  50 mg Oral Daily Renae FickleMackenzie Short, MD   50 mg at 08/16/13 1950  . ondansetron (ZOFRAN) tablet 4 mg  4 mg Oral Q6H PRN Renae FickleMackenzie Short, MD      . predniSONE (DELTASONE) tablet 40 mg  40 mg Oral Q breakfast Renae FickleMackenzie Short, MD   40 mg at 08/16/13 2010  . simvastatin (ZOCOR) tablet 20 mg  20 mg Oral q1800 Renae FickleMackenzie Short, MD   20 mg at 08/16/13 2011  . traMADol (ULTRAM) tablet 50 mg  50 mg Oral Q6H PRN Renae FickleMackenzie Short, MD      . traZODone (DESYREL) tablet 50 mg  50 mg Oral QHS PRN Renae FickleMackenzie Short, MD         Allergies  Allergen Reactions  . Penicillins Anaphylaxis     Past Medical History  Diagnosis Date  . CORONARY ARTERY DISEASE 07/20/2010    with stent  . DEPRESSION 07/20/2010  . DM (diabetes mellitus) type II controlled, neurological manifestation 07/20/2010  . GERD 07/20/2010  . HYPERLIPIDEMIA 07/20/2010  .  HYPERTENSION 07/20/2010  . LOW BACK PAIN 07/20/2010  . OSTEOARTHRITIS 07/20/2010  . URINARY INCONTINENCE 07/20/2010  . TIA (transient ischemic attack)   . CAD (coronary artery disease)      Stenting 2008 (no details)  . Dementia   . Hypokalemia   . Spinal stenosis   . Tobacco abuse   . Varicose vein of leg     Past Surgical History  Procedure Laterality Date  . Abdominal hysterectomy    . Vitrectomy Left   . Total knee arthroplasty    . Coronary stent placement    . Varicose vein surgery      Family History  Problem Relation Age of Onset  . Hypertension Mother   . Diabetes Mother   . Breast cancer Sister     Social History:  reports that she has been smoking Cigarettes.  She has been smoking about 0.50 packs per day. She has never used smokeless tobacco. She reports that she does not drink alcohol or use illicit drugs.   Review of Systems: Constitutional:  admits to fever, chills,   HEENT: denies photophobia, eye pain, redness, hearing loss, ear pain, congestion, sore throat, rhinorrhea, sneezing, neck pain, neck stiffness and tinnitus.  Respiratory: admits to SOB, DOE, cough, chest tightness, and wheezing.  Cardiovascular: denies chest pain, palpitations and leg swelling.  Gastrointestinal: denies nausea, vomiting, abdominal pain, diarrhea, constipation, blood in stool.  Genitourinary: denies dysuria, urgency, frequency, hematuria, flank pain and difficulty urinating.  Musculoskeletal: denies  myalgias, back pain, joint swelling, arthralgias and gait problem.   Skin: denies pallor, rash and wound.  Neurological: denies dizziness, seizures, syncope, weakness, light-headedness, numbness and headaches.   Hematological: denies adenopathy, easy bruising, personal or family bleeding history.  Psychiatric/ Behavioral: denies suicidal ideation, mood changes, confusion, nervousness, sleep disturbance and agitation.    Physical Exam: BP 155/70  Pulse 78  Temp(Src) 98.2 F (36.8 C) (Oral)  Resp 24  Ht 5' (1.524 m)  SpO2 96%  Wt Readings from Last 3 Encounters:  04/04/13 143 lb (64.864 kg)  09/04/12 152 lb (68.947 kg)  07/24/12 143 lb (64.864 kg)     General: Vital signs reviewed and noted. Well-developed, well-nourished, in no acute distress; alert,   Head: Normocephalic, atraumatic, sclera anicteric,   Neck: Supple. Negative for carotid bruits. No JVD   Lungs:  Thick rhonchi and rales in right mid lung field and right base  Heart: RRR with S1 S2. Soft systolic murmur  Abdomen:  Soft, non-tender, non-distended with normoactive bowel sounds. No hepatomegaly. No rebound/guarding. No obvious abdominal masses   MSK: Strength and the appear normal for age.   Extremities: No clubbing or cyanosis. No edema.  Distal pedal pulses are 2+ and equal   Neurologic: Alert and oriented X 3. Moves all extremities spontaneously.  Psych: Responds to questions appropriately with a normal affect.     Lab results: Basic Metabolic Panel:  Recent Labs Lab 08/16/13 1045 08/17/13 0310  NA 138 140  K 3.7 4.1  CL 99 105  CO2 21 21  GLUCOSE 280* 282*  BUN 14 17  CREATININE 0.67 0.72  CALCIUM 9.1 8.9    CBC:  Recent Labs Lab 08/16/13 1045 08/17/13 0310  WBC 17.2* 17.3*  NEUTROABS 15.1*  --   HGB 14.2 12.6  HCT 41.4 37.3  MCV 84.3 84.4  PLT 237 231    Cardiac Enzymes:  Recent Labs Lab 08/16/13 2100 08/17/13 0310  TROPONINI <0.30 0.38*    BNP: No components found  with this basename: POCBNP,   CBG:  Recent Labs Lab 08/16/13 1759 08/16/13 2310  GLUCAP 148* 283*    Coagulation Studies: No results found for this basename: LABPROT, INR,  in the last 72 hours   Other results:  EKG:   NSR at 72, LBBB   Imaging: Dg Chest 2 View  08/16/2013   CLINICAL DATA:  Cough, congestion  EXAM: CHEST  2 VIEW  COMPARISON:  02/18/2013  FINDINGS: Borderline cardiomegaly. Degenerative changes thoracic spine again noted. No acute infiltrate or pulmonary edema. Central mild bronchitic changes.  IMPRESSION: No acute infiltrate or pulmonary edema. Central mild bronchitic changes.   Electronically Signed   By: Natasha Mead M.D.   On: 08/16/2013  10:25   Dg Chest Port 1 View  08/16/2013   CLINICAL DATA:  Acute respiratory distress.  Shortness breath.  EXAM: PORTABLE CHEST - 1 VIEW  COMPARISON:  Chest x-ray 08/16/2013.  FINDINGS: Ill-defined airspace consolidation in the right lung base. Left lung is clear. No pleural effusions. Mild cephalization of the pulmonary vasculature, without frank pulmonary edema. Mild cardiomegaly. Upper mediastinal contours are within normal limits. Atherosclerosis in the thoracic aorta.  IMPRESSION: 1. Ill-defined airspace consolidation in the right lung base may either be within the right middle or lower lobes, concerning for early bronchopneumonia. 2. Mild cardiomegaly with pulmonary venous congestion, but no frank pulmonary edema.   Electronically Signed   By: Trudie Reed M.D.   On: 08/16/2013 20:50      Last Echo  06/11/12": Left ventricle: The cavity size was normal. Systolic function was normal. The estimated ejection fraction was in the range of 55% to 60%. Wall motion was normal; there were no regional wall motion abnormalities. There was an increased relative contribution of atrial contraction to ventricular filling. Doppler parameters are consistent with abnormal left ventricular relaxation (grade 1 diastolic dysfunction). - Aortic valve: Mild regurgitation. - Mitral valve: Mild regurgitation.      Assessment & Plan:  1. + troponin:  She has not had any signs or symptoms of ACS.  I suspect her minimla troponin elevation is due to her severe pneumonia / sepsis.  No ECG changes.    She has mild diastolic dysfunction.   Echo has been ordered by Int. Med.  No indication for heparin as noted by RN last night. No indication for stress test at this time. Continue current Rx for pneumonia.  I agree with Dr. Joan Mayans assessment yesterday  that she was likely volume depleted - there are no signs of volume overload .  Her dyspnea is due to pneumonia.  She says that she has stopped smoking.    Following.        Vesta Mixer, Montez Hageman., MD, [facility] 08/17/2013, 7:29 AM Office - [phone] Pager [phone]

## 2013-08-17 NOTE — Progress Notes (Signed)
RT decreased oxygen FIO2 from 40% to 35% due to patient keeping sats at 98%. RT will continue to wean O2 and monitor as needed. Patient tolerating well.

## 2013-08-17 NOTE — Progress Notes (Signed)
Foley inserted at 2230 after patient attempted to get oob, to bathroom(strict bedrest)during this time patient became short of breath/restless x 5 minutes, Venturi mask maintained during this time.  No further episodes noted

## 2013-08-17 NOTE — Progress Notes (Signed)
[facility] PROGRESS NOTE  VEAR STATON WUJ:[id number] DOB: 1933-05-07 DOA: 08/16/2013 PCP: Rogelia Boga, MD  Assessment/Plan  Acute hypoxic respiratory failure and sepsis due to CAP, RLL.  - Blood cultures not obtained in ER  - UA unremarkable  - Continue Doxycycline monotherapy  - legionella neg - S. Pneumo neg -  Flu PCR neg   Acute copd exacerbation, likely triggered by pneumonia, wheezing improving - continue prednisone 40mg  daily  - continue duonebs  - abx as above  - Tessalon TID and guaifenesin prn  -  Outpatient PFTs once improved  Elevated troponin (NSTEMI type 2), strain from sepsis and pneumonia -  F/u ECHO -  Telemetry:  LBBB, regular rhythm -  Appreciate cardiology assistance  T2DM, with hyperglycemia due to steroids  - A1c pending - Continue moderate dose SSI -  Add aspart 3 units with meals -  Add lantus 10 units   CAD/HTN/HLD, BP low normal - Continue ASA, clonidine, felodipine, hydralazine, lisinopril, and metoprolol  -  Place hold parameters of BP medications - Continue simvastatin   Depression/anxiety, stable. Continue ativan  Dementia, stable, continue memantine  Chronic pain, stable. Continue cymbalta and prn tramadol  Leukocytosis due to dehydration and possible pneumonia, unchanged, but is probably being affected by prednisone -  abx as above  - Repeat WBC in AM   Elevated lactic acid likely due to dehydration, no abdominal pain.  Given IVF.    Diet:  diabetic Access:  PIV IVF:  off Proph:  lovenox  Code Status: full Family Communication: patient alone  Disposition Plan:   Would like her tachypnea to resolve and hopefully transition to RA before discharge.  To ALF in next few days.  OOB and PT eval to prevent deconditioning.     Consultants:  Cardiology  Rapid response 2/14  Procedures:  CXR  ECHO  Antibiotics:  Doxycycline 2/13 >>   HPI/Subjective:  Had episode of acute respiratory distress last night  when getting up to use bathroom requiring rapid response.  States that she feels somewhat better with oxygen on.  Her breathing is easier.  She is still coughing.  Denies pains currently, nausea.  She is hungry.  Denies problems with bowels or bladder  Objective: Filed Vitals:   08/17/13 0814 08/17/13 1217 08/17/13 1300 08/17/13 1344  BP:   111/57 125/55  Pulse:   70 71  Temp:   98.5 F (36.9 C) 97.9 F (36.6 C)  TempSrc:   Oral Oral  Resp:      Height:      SpO2: 98% 96% 97% 96%    Intake/Output Summary (Last 24 hours) at 08/17/13 1405 Last data filed at 08/17/13 0400  Gross per 24 hour  Intake    940 ml  Output      0 ml  Net    940 ml   There were no vitals filed for this visit.  Exam:   General:  CF, no acute distress, completing breathing treatment  HEENT:  NCAT, MMM  Cardiovascular:  RRR, nl S1, S2 no mrg, 2+ pulses, warm extremities  Respiratory:  Improved aeration, persistent wheezes and persistent rales at the right base.  Rales at left base have since resolved.  no increased WOB  Abdomen:   NABS, soft, NT/ND  MSK:   Normal tone and bulk, no LEE  Neuro:  Grossly intact  Data Reviewed: Basic Metabolic Panel:  Recent Labs Lab 08/16/13 1045 08/17/13 0310  NA 138 140  K 3.7 4.1  CL 99 105  CO2 21 21  GLUCOSE 280* 282*  BUN 14 17  CREATININE 0.67 0.72  CALCIUM 9.1 8.9   Liver Function Tests: No results found for this basename: AST, ALT, ALKPHOS, BILITOT, PROT, ALBUMIN,  in the last 168 hours No results found for this basename: LIPASE, AMYLASE,  in the last 168 hours No results found for this basename: AMMONIA,  in the last 168 hours CBC:  Recent Labs Lab 08/16/13 1045 08/17/13 0310  WBC 17.2* 17.3*  NEUTROABS 15.1*  --   HGB 14.2 12.6  HCT 41.4 37.3  MCV 84.3 84.4  PLT 237 231   Cardiac Enzymes:  Recent Labs Lab 08/16/13 2100 08/17/13 0310 08/17/13 0832  TROPONINI <0.30 0.38* <0.30   BNP (last 3 results) No results found for  this basename: PROBNP,  in the last 8760 hours CBG:  Recent Labs Lab 08/16/13 1759 08/16/13 2310 08/17/13 0814  GLUCAP 148* 283* 261*    No results found for this or any previous visit (from the past 240 hour(s)).   Studies: Dg Chest 2 View  08/16/2013   CLINICAL DATA:  Cough, congestion  EXAM: CHEST  2 VIEW  COMPARISON:  02/18/2013  FINDINGS: Borderline cardiomegaly. Degenerative changes thoracic spine again noted. No acute infiltrate or pulmonary edema. Central mild bronchitic changes.  IMPRESSION: No acute infiltrate or pulmonary edema. Central mild bronchitic changes.   Electronically Signed   By: Natasha Mead M.D.   On: 08/16/2013 10:25   Dg Chest Port 1 View  08/16/2013   CLINICAL DATA:  Acute respiratory distress.  Shortness breath.  EXAM: PORTABLE CHEST - 1 VIEW  COMPARISON:  Chest x-ray 08/16/2013.  FINDINGS: Ill-defined airspace consolidation in the right lung base. Left lung is clear. No pleural effusions. Mild cephalization of the pulmonary vasculature, without frank pulmonary edema. Mild cardiomegaly. Upper mediastinal contours are within normal limits. Atherosclerosis in the thoracic aorta.  IMPRESSION: 1. Ill-defined airspace consolidation in the right lung base may either be within the right middle or lower lobes, concerning for early bronchopneumonia. 2. Mild cardiomegaly with pulmonary venous congestion, but no frank pulmonary edema.   Electronically Signed   By: Trudie Reed M.D.   On: 08/16/2013 20:50    Scheduled Meds: . aspirin EC  81 mg Oral Daily  . benzonatate  100 mg Oral TID  . cholecalciferol  1,000 Units Oral Daily  . cloNIDine  0.3 mg Oral BID  . doxycycline  100 mg Oral Q12H  . DULoxetine  60 mg Oral Daily  . enoxaparin (LOVENOX) injection  40 mg Subcutaneous Q24H  . felodipine  10 mg Oral Daily  . hydrALAZINE  50 mg Oral QID  . insulin aspart  0-15 Units Subcutaneous TID WC  . insulin aspart  0-5 Units Subcutaneous QHS  . insulin aspart  3 Units  Subcutaneous TID WC  . insulin glargine  10 Units Subcutaneous QHS  . ipratropium-albuterol  3 mL Nebulization Q4H WA  . lisinopril  20 mg Oral Daily  . LORazepam  0.5 mg Oral TID  . Memantine HCl ER  28 mg Oral q morning - 10a  . metoprolol succinate  50 mg Oral Daily  . predniSONE  40 mg Oral Q breakfast  . simvastatin  20 mg Oral q1800   Continuous Infusions:   Principal Problem:   CAP (community acquired pneumonia) Active Problems:   DIABETES MELLITUS, TYPE II   DEPRESSION   HYPERTENSION   CORONARY ARTERY DISEASE   Dementia  COPD with acute exacerbation   Chronic diastolic heart failure   Sepsis    Time spent: 30 min    Ireoluwa Grant, [facility]ZIE  [facility] Pager [phone]. If 7PM-7AM, please contact night-coverage at [email or web address], password [email or web address] 08/17/2013, 2:05 PM  LOS: 1 day

## 2013-08-17 NOTE — Progress Notes (Signed)
RT decreased oxygen percentage from 50% to 40% . Patient is maintaining sats at 97%. RT will continue to wean and monitor as needed.

## 2013-08-17 NOTE — Progress Notes (Signed)
  Echocardiogram 2D Echocardiogram has been performed.  Arvil ChacoFoster, Shailene Demonbreun 08/17/2013, 9:21 AM

## 2013-08-18 DIAGNOSIS — IMO0002 Reserved for concepts with insufficient information to code with codable children: Secondary | ICD-10-CM

## 2013-08-18 DIAGNOSIS — F05 Delirium due to known physiological condition: Secondary | ICD-10-CM

## 2013-08-18 LAB — BASIC METABOLIC PANEL
BUN: 21 mg/dL (ref 6–23)
CALCIUM: 9.1 mg/dL (ref 8.4–10.5)
CO2: 20 mEq/L (ref 19–32)
Chloride: 104 mEq/L (ref 96–112)
Creatinine, Ser: 0.82 mg/dL (ref 0.50–1.10)
GFR, EST AFRICAN AMERICAN: 77 mL/min — AB (ref >90)
GFR, EST NON AFRICAN AMERICAN: 67 mL/min — AB (ref >90)
GLUCOSE: 156 mg/dL — AB (ref 70–99)
POTASSIUM: 3.8 meq/L (ref 3.7–5.3)
Sodium: 139 mEq/L (ref 137–147)

## 2013-08-18 LAB — CBC
HCT: 38.9 % (ref 36.0–46.0)
Hemoglobin: 12.9 g/dL (ref 12.0–15.0)
MCH: 28.1 pg (ref 26.0–34.0)
MCHC: 33.2 g/dL (ref 30.0–36.0)
MCV: 84.7 fL (ref 78.0–100.0)
Platelets: 270 10*3/uL (ref 150–400)
RBC: 4.59 MIL/uL (ref 3.87–5.11)
RDW: 14.1 % (ref 11.5–15.5)
WBC: 17.8 10*3/uL — ABNORMAL HIGH (ref 4.0–10.5)

## 2013-08-18 LAB — GLUCOSE, CAPILLARY: Glucose-Capillary: 166 mg/dL — ABNORMAL HIGH (ref 70–99)

## 2013-08-18 MED ORDER — PREDNISONE 10 MG PO TABS: ORAL_TABLET | ORAL | Status: DC

## 2013-08-18 MED ORDER — BENZONATATE 100 MG PO CAPS: 100.0000 mg | ORAL_CAPSULE | Freq: Three times a day (TID) | ORAL | Status: DC

## 2013-08-18 MED ORDER — ALBUTEROL SULFATE (2.5 MG/3ML) 0.083% IN NEBU: 2.5000 mg | INHALATION_SOLUTION | RESPIRATORY_TRACT | Status: DC | PRN

## 2013-08-18 MED ORDER — TRAZODONE HCL 50 MG PO TABS: 50.0000 mg | ORAL_TABLET | Freq: Every evening | ORAL | Status: DC | PRN

## 2013-08-18 MED ORDER — PREDNISONE 20 MG PO TABS
30.0000 mg | ORAL_TABLET | Freq: Every day | ORAL | Status: DC
  Filled 2013-08-18: qty 1

## 2013-08-18 MED ORDER — IPRATROPIUM BROMIDE 0.02 % IN SOLN: 0.5000 mg | Freq: Four times a day (QID) | RESPIRATORY_TRACT | Status: DC

## 2013-08-18 MED ORDER — INSULIN ASPART 100 UNIT/ML IV SOLN: 0.0000 [IU] | Freq: Three times a day (TID) | INTRAVENOUS | Status: DC

## 2013-08-18 MED ORDER — DOXYCYCLINE HYCLATE 100 MG PO TABS: 100.0000 mg | ORAL_TABLET | Freq: Two times a day (BID) | ORAL | Status: DC

## 2013-08-18 MED ORDER — HALOPERIDOL LACTATE 5 MG/ML IJ SOLN
2.0000 mg | Freq: Once | INTRAMUSCULAR | Status: DC
  Filled 2013-08-18: qty 1

## 2013-08-18 MED ORDER — ALBUTEROL SULFATE (2.5 MG/3ML) 0.083% IN NEBU
2.5000 mg | INHALATION_SOLUTION | RESPIRATORY_TRACT | Status: DC | PRN
Start: 2013-08-18 — End: 2015-03-20

## 2013-08-18 MED ORDER — LORAZEPAM 2 MG/ML IJ SOLN
1.0000 mg | Freq: Once | INTRAMUSCULAR | Status: AC
  Administered 2013-08-18: 1 mg via INTRAVENOUS

## 2013-08-18 MED ORDER — HALOPERIDOL LACTATE 5 MG/ML IJ SOLN: 1.0000 mg | Freq: Four times a day (QID) | INTRAMUSCULAR | Status: DC | PRN

## 2013-08-18 MED ORDER — LORAZEPAM 2 MG/ML IJ SOLN
1.0000 mg | Freq: Once | INTRAMUSCULAR | Status: AC
  Administered 2013-08-18: 1 mg via INTRAVENOUS
  Filled 2013-08-18: qty 1

## 2013-08-18 MED ORDER — LORAZEPAM 1 MG PO TABS: 1.0000 mg | ORAL_TABLET | ORAL | Status: DC | PRN

## 2013-08-18 MED ORDER — LORAZEPAM 2 MG/ML IJ SOLN
INTRAMUSCULAR | Status: AC
  Administered 2013-08-18: 1 mg via INTRAVENOUS
  Filled 2013-08-18: qty 1

## 2013-08-18 NOTE — Progress Notes (Addendum)
Pt agitated,restless, and uncooperative. States she is not in the hospital and she needs to look for her son who is autistic. States he came to this house with her. Daughter and a son spoke with pt on the phone, she still didn't believe whAT they were telling her. Daughter arrived and pt calmed down. Discharge instructions explained to daughter and packet given to paramedic. [facility], spoke to [employer] Engineering[employer]icki Walker.

## 2013-08-18 NOTE — Progress Notes (Signed)
Sent d/c summary and FL2.  Confirmed receipt of d/c summary and FL2.  Facility ready to receive Pt.  Arranged for transportation.  Providence CrosbyAmanda Omeka Holben, LCSWA Clinical Social Work [phone]

## 2013-08-18 NOTE — Progress Notes (Signed)
Clinical Social Work Department BRIEF PSYCHOSOCIAL ASSESSMENT 08/18/2013  Patient:  Glenda Underwood, Glenda Underwood     Account Number:  [id number]     Admit date:  08/16/2013  Clinical Social Worker:  Levie Heritage  Date/Time:  08/18/2013 11:02 AM  Referred by:  Physician  Date Referred:  08/18/2013  Other Referral:   Interview type:  Patient Other interview type:   Pt's daughter, Colletta [place], at bedside    PSYCHOSOCIAL DATA Living Status:  FACILITY Admitted from facility:  [facility] Level of care:  Assisted Living Primary support name:  Colletta [place] Primary support relationship to patient:  CHILD, ADULT Degree of support available:   strong    CURRENT CONCERNS Current Concerns  Post-Acute Placement   Other Concerns:    SOCIAL WORK ASSESSMENT / PLAN Per MD, Pt ready for d/c.    [facility].  Informed by Jocelyn Lamer that the facility does not accept weekend admissions.    RN informed.    Met with Pt and her daughter in Pt's room to discuss d/c.    Pt's daughter confirmed that Pt is a resident of [facility] and that Pt very ready to go back today.  Pt was fully dressed, seated in her wheelchair by the door.    CSW informed Pt and daughter that [facility] is unable to accept Pts over the weekend.  Pt's daughter frustrated at this news, as she feels that, since Pt pays to live there, they should take her back.  CSW encouraged Pt's daughter to contact the facility and express her frustrations.  Pt's daughter stated that she would do so. She said, "They owe me."    CSW thanked Pt and her daughter for their time.    CSW was later informed by RN that the facility has agreed to accept Pt.    CSW spoke with Vickie at [facility].  Vickie confirmed that the facility can accept Pt, as the DON has made a concession for Pt.    CSW to fax d/c summary and FL2.    CSW to continue with d/c process.   Assessment/plan status:  Psychosocial Support/Ongoing Assessment of Needs Other  assessment/ plan:   Information/referral to community resources:    PATIENT'S/FAMILY'S RESPONSE TO PLAN OF CARE: Pt and daughter frustrated, initially, at the facility's unwillingness to accept Pt back.  Pt's daughter stated, "She lives there.  She pays to be there."    Pt's daughter contacted the facility and it's now the facility's stance that they will take Pt back.    Pt and daughter very happy about this and are eager for Pt to return.    Pt and daughter thanked CSW for time and assistance.   Bernita Raisin, [facility] [phone]

## 2013-08-18 NOTE — Plan of Care (Signed)
Problem: Discharge Progression Outcomes Goal: Discharge plan in place and appropriate Outcome: Completed/Met Date Met:  08/18/13 [facility]

## 2013-08-18 NOTE — Discharge Summary (Addendum)
Physician Discharge Summary  Glenda Underwood ZOX:[id number] DOB: 10-Jun-1933 DOA: 78/13/2015  PCP: Rogelia Boga, MD  Admit date: 08/16/2013 Discharge date: 08/18/2013  Recommendations for Outpatient Follow-up:  1. Follow up with primary care doctor in 5 days for repeat examination.  Follow up CBC and make sure diabetes is still adequately controlled after steroid taper.  Refer for PFTs once clinically improved.   2. While on steroids, please check blood sugars ACHS and administer sliding scale insulin.   3. Doxy through 2/22, then stop.    Discharge Diagnoses:  Principal Problem:   CAP (community acquired pneumonia) Active Problems:   DIABETES MELLITUS, TYPE II   DEPRESSION   HYPERTENSION   CORONARY ARTERY DISEASE   Dementia   COPD with acute exacerbation   Chronic diastolic heart failure   Sepsis   Acute respiratory failure with hypoxia   Steroid-induced psychosis   Delirium due to multiple etiologies   Discharge Condition: stable, improved  Diet recommendation: diabetic  Wt Readings from Last 3 Encounters:  04/04/13 64.864 kg (143 lb)  09/04/12 68.947 kg (152 lb)  07/24/12 64.864 kg (143 lb)    History of present illness:  The patient is a 80 y.o. year-old female with history of CAD, T2DM, chronic pain from arthritis and spinal stenosis, possible COPD with ongoing tobacco use, TIA and dementia who presents with cough and SOB. The patient was last at their baseline health several days ago. She resides at [facility] and ambulated with walker. She is very social and functional.  She states that last week, she developed sinus congestion, rhinorrhea which has progressed to cough productive of phlegm which she has been swallowing, SOB with exertion, and wheezing. She has had subjective fevers, however, her ALF did not record any fevers. She denies pain, nausea, vomiting, diarrhea, constipation, and dysuria.  In the ER, HR initially 120s, T 99.13F, BP 208/80 which promptly  decreased to 140-150 range. Oxygen saturations in the low 90s and decreased to 88% with ambulation. WBC 17.2, BMP normal except for glucose of 280. CXR demonstrated no acute infiltrate, but ER physician stated she was consistently diminished and crackly at the right base. She was wheezing and received a duoneb. Given doxycycline monotherapy for CAP.   Hospital Course:   Acute hypoxic respiratory failure and sepsis due to CAP, RLL.  Blood cultures not obtained prior to antibiotics and she remained afebrile.  UA was unremarkable.  She was started on Doxycycline monotherapy at the request of the family.  Legionella neg, S. Pneumo neg, Flu PCR neg.  Her respiratory failure resolved and she has been ambulating on room air without desaturations on the day of discharge.    Acute copd exacerbation, likely triggered by pneumonia, wheezing improving with breathing treatments and steroids.  She was given prednisone 40mg  daily to taper rapidly.  Given prescription for alb/ipra and nebulizer.  Continue Tessalon TID and guaifenesin prn.  She will need outpatient PFTs once improved.  Elevated troponin (NSTEMI type 2), strain from sepsis and pneumonia.  Peak troponin of 0.38.  ECG demonstrated stable LBBB and telemetry demonstrated regular rhythm with LBBB.  Cardiology was consulted and felt this was related to heart strain from pneumonia and COPD and not type 1 NSTEMI.  Her ECHO demonstrated EF of 50-55% with normal wall motion, mod TR, PA peak pressure of 43mm Hg.  She will follow up with Dr. Antoine Poche at her previously scheduled appointment.    T2DM, with hyperglycemia due to steroids.  A1c of 7.2.  She was given lantus with meal time insulin coverage.  Her A1c was 7.2.  Recommend that she be administered sliding scale insulin at her assisted living facility until her steroids have been tapered.    CBG < 70:  Hypoglycemia protocol CBG 70-120:  0 units CBG 121 - 150:  2 units CBG 151-200:  3 units CBG 201-250:  5  units CBG 251-300:  8 units CBG 301-350:  11 units CBG 351-400:  15 units  Steroid-induced psychosis, delirium secondary to multiple medical problems.  Recommended that she continue ativan 0.5mg  TID, however, recommend that she be given trazodone nightly with possible one additional dose if needed and that her prn ativan dose be increased.  Her doses may be adjusted as her steroids are tapered and her pneumonia resolves.     CAD/HTN/HLD, BP low normal initially but trended up.  Continue ASA, clonidine, felodipine, hydralazine, lisinopril, and metoprolol.  Continued simvastatin.  Depression/anxiety, stable. Continue ativan, see above.   Dementia, stable, continue memantine  Chronic pain, stable. Continue cymbalta and prn tramadol  Leukocytosis due to dehydration and possible pneumonia, unchanged, but is probably being affected by prednisone.  Repeat CBC in 1-2 week.  Elevated lactic acid likely due to dehydration, no abdominal pain. Given IVF.    Consultants:  Cardiology  Rapid response 2/14 Procedures:  CXR  ECHO Antibiotics:  Doxycycline 2/13 >>   Discharge Exam: Filed Vitals:   08/18/13 0622  BP: 157/66  Pulse: 89  Temp: 98.3 F (36.8 C)  Resp: 20   Filed Vitals:   08/17/13 2125 08/18/13 0230 08/18/13 0622 08/18/13 0805  BP: 155/60  157/66   Pulse: 84  89   Temp: 98.1 F (36.7 C)  98.3 F (36.8 C)   TempSrc: Oral  Oral   Resp: 22  20   Height:      SpO2: 94% 100% 94% 100%    General: CF, no acute distress  HEENT: NCAT, MMM  Cardiovascular: RRR, nl S1, S2 no mrg, 2+ pulses, warm extremities  Respiratory: Improved aeration, persistent wheezes and persistent rales at the right base. Rales at left base have since resolved. no increased WOB  Abdomen: NABS, soft, NT/ND MSK: Normal tone and bulk, no LEE  Neuro: Grossly intact Psych:  Yelling and agitated.  Redirectable   Discharge Instructions      Discharge Orders   Future Orders Complete By Expires   Diet -  low sodium heart healthy  As directed    Discharge instructions  As directed    Comments:     Glenda Underwood was hospitalized with COPD exacerbation and pneumonia.  She will need to continue prednisone in a decreasing dose with combivent every 6 hours for her COPD exacerbation.  To confirm possible COPD and to make sure she is on appropriate treatment, her PCP may consider pulmonary lung function tests.  She should continue doxycycline for treatment of her pneumonia.  Because she has diabetes and is on prednisone, her blood sugars will be elevated for the next week.  Please have her facility check her blood sugars before meals and bedtime and administer sliding scale insulin.  She had some delirium due to steroids and illness.  Please administer her ativan regularly and give her her trazodone nightly.  Her dose of as needed ativan has been increased and she may have an additional dose of trazodone at bedtime if her initial dose is ineffective.  Return to the hospital if she has worsening problems breathing.  Increase activity slowly  As directed        Medication List         acetaminophen 325 MG tablet  Commonly known as:  TYLENOL  Take 650 mg by mouth every 4 (four) hours as needed for pain.     albuterol (2.5 MG/3ML) 0.083% nebulizer solution  Commonly known as:  PROVENTIL  Take 3 mLs (2.5 mg total) by nebulization every 4 (four) hours as needed for wheezing or shortness of breath.     aspirin 81 MG tablet  Take 81 mg by mouth daily.     benzonatate 100 MG capsule  Commonly known as:  TESSALON  Take 1 capsule (100 mg total) by mouth 3 (three) times daily.     cholecalciferol 1000 UNITS tablet  Commonly known as:  VITAMIN D  Take 1,000 Units by mouth daily.     cloNIDine 0.2 MG tablet  Commonly known as:  CATAPRES  Take 0.3 mg by mouth 2 (two) times daily. Takes 1.5 tablets     dextromethorphan-guaiFENesin 10-100 MG/5ML liquid  Commonly known as:  ROBITUSSIN-DM  Take 10 mLs by  mouth every 4 (four) hours as needed for cough.     doxycycline 100 MG tablet  Commonly known as:  VIBRA-TABS  Take 1 tablet (100 mg total) by mouth every 12 (twelve) hours.     DULoxetine 60 MG capsule  Commonly known as:  CYMBALTA  Take 1 capsule (60 mg total) by mouth daily.     felodipine 10 MG 24 hr tablet  Commonly known as:  PLENDIL  Take 1 tablet (10 mg total) by mouth daily.     hydrALAZINE 50 MG tablet  Commonly known as:  APRESOLINE  Take 50 mg by mouth 4 (four) times daily.     insulin aspart 100 unit/mL injection  Commonly known as:  novoLOG  Inject 0-15 Units into the skin 3 (three) times daily before meals.     ipratropium 0.02 % nebulizer solution  Commonly known as:  ATROVENT  Take 2.5 mLs (0.5 mg total) by nebulization 4 (four) times daily.     lisinopril 20 MG tablet  Commonly known as:  PRINIVIL,ZESTRIL  Take 1 tablet (20 mg total) by mouth daily.     LORazepam 0.5 MG tablet  Commonly known as:  ATIVAN  Take 0.5 mg by mouth 3 (three) times daily.     LORazepam 1 MG tablet  Commonly known as:  ATIVAN  Take 1 tablet (1 mg total) by mouth every 4 (four) hours as needed for anxiety or sleep. Not to be given within 4 hours of scheduled dose     metoprolol succinate 100 MG 24 hr tablet  Commonly known as:  TOPROL-XL  - Take 50 mg by mouth daily. At 5pm. Hold if pulse is less than 50  - Take with or immediately following a meal.     NAMENDA XR 28 MG Cp24  Generic drug:  Memantine HCl ER  Take 28 mg by mouth daily.     ondansetron 4 MG tablet  Commonly known as:  ZOFRAN  Take 4 mg by mouth every 6 (six) hours as needed for nausea or vomiting.     predniSONE 10 MG tablet  Commonly known as:  DELTASONE  Take 3 tabs daily x 3 days, then 2 tabs daily x 3 days, then 1 tab daily x 3 days, then stop.     simvastatin 20 MG tablet  Commonly known as:  ZOCOR  Take 1 tablet (20 mg total) by mouth daily.     traMADol 50 MG tablet  Commonly known as:   ULTRAM  Take 50 mg by mouth every 6 (six) hours as needed (pain).     traZODone 50 MG tablet  Commonly known as:  DESYREL  Take 1 tablet (50 mg total) by mouth at bedtime and may repeat dose one time if needed.          The results of significant diagnostics from this hospitalization (including imaging, microbiology, ancillary and laboratory) are listed below for reference.    Significant Diagnostic Studies: Dg Chest 2 View  08/16/2013   CLINICAL DATA:  Cough, congestion  EXAM: CHEST  2 VIEW  COMPARISON:  02/18/2013  FINDINGS: Borderline cardiomegaly. Degenerative changes thoracic spine again noted. No acute infiltrate or pulmonary edema. Central mild bronchitic changes.  IMPRESSION: No acute infiltrate or pulmonary edema. Central mild bronchitic changes.   Electronically Signed   By: Natasha Mead M.D.   On: 08/16/2013 10:25   Dg Chest Port 1 View  08/16/2013   CLINICAL DATA:  Acute respiratory distress.  Shortness breath.  EXAM: PORTABLE CHEST - 1 VIEW  COMPARISON:  Chest x-ray 08/16/2013.  FINDINGS: Ill-defined airspace consolidation in the right lung base. Left lung is clear. No pleural effusions. Mild cephalization of the pulmonary vasculature, without frank pulmonary edema. Mild cardiomegaly. Upper mediastinal contours are within normal limits. Atherosclerosis in the thoracic aorta.  IMPRESSION: 1. Ill-defined airspace consolidation in the right lung base may either be within the right middle or lower lobes, concerning for early bronchopneumonia. 2. Mild cardiomegaly with pulmonary venous congestion, but no frank pulmonary edema.   Electronically Signed   By: Trudie Reed M.D.   On: 08/16/2013 20:50    Microbiology: No results found for this or any previous visit (from the past 240 hour(s)).   Labs: Basic Metabolic Panel:  Recent Labs Lab 08/16/13 1045 08/17/13 0310 08/18/13 0543  NA 138 140 139  K 3.7 4.1 3.8  CL 99 105 104  CO2 21 21 20   GLUCOSE 280* 282* 156*  BUN 14  17 21   CREATININE 0.67 0.72 0.82  CALCIUM 9.1 8.9 9.1   Liver Function Tests: No results found for this basename: AST, ALT, ALKPHOS, BILITOT, PROT, ALBUMIN,  in the last 168 hours No results found for this basename: LIPASE, AMYLASE,  in the last 168 hours No results found for this basename: AMMONIA,  in the last 168 hours CBC:  Recent Labs Lab 08/16/13 1045 08/17/13 0310 08/18/13 0543  WBC 17.2* 17.3* 17.8*  NEUTROABS 15.1*  --   --   HGB 14.2 12.6 12.9  HCT 41.4 37.3 38.9  MCV 84.3 84.4 84.7  PLT 237 231 270   Cardiac Enzymes:  Recent Labs Lab 08/16/13 2100 08/17/13 0310 08/17/13 0832  TROPONINI <0.30 0.38* <0.30   BNP: BNP (last 3 results) No results found for this basename: PROBNP,  in the last 8760 hours CBG:  Recent Labs Lab 08/17/13 0814 08/17/13 1123 08/17/13 1618 08/17/13 2131 08/18/13 0738  GLUCAP 261* 336* 241* 111* 166*    Time coordinating discharge: 45 minutes  Signed:  Aylana Hirschfeld  [facility] 08/18/2013, 9:58 AM

## 2013-08-18 NOTE — Progress Notes (Signed)
08/18/2013 1600 [facility]CM [facility]. Will arrange to have Neb machine delivered to ALF. Isidoro DonningAlesia Marshawn Normoyle RN C[facility] Case Mgmt phone [phone]

## 2013-08-18 NOTE — Progress Notes (Signed)
CONSULT NOTE  Date: 08/18/2013               Patient Name:  Glenda Underwood MRN: [id number]  DOB: 04/01/1933 Age / Sex: 80 y.o., female        PCP: Rogelia BogaKWIATKOWSKI,PETER FRANK Primary Cardiologist: Antoine PocheHochrein            Referring Physician: Judie PetitM. Short. MD              Reason for Consult: + troponins in the setting of pneumonia / sepsis           History of Present Illness: Patient is a 80 y.o. female with a PMHx of CAD, s/p stenting, DM, hyperlipidemia, COPD ( ongoing cigarette smoking)  dementiamild diastolic dysfunction , who was admitted to [facility] 08/16/2013 for evaluation of sinus congestion, dyspnea, wheezing.  She presented with fever, elevated WBC, hyperglycemia.   CXR shows RLL and RML pneumonia, mild cardiomegaly.    Subsequent Troponin levels have been negative.  Echo shows EF of 50-55% mild LAE, RAE, mod TR, mild pulmonary hypertension   Medications: Outpatient medications: Prescriptions prior to admission  Medication Sig Dispense Refill  . acetaminophen (TYLENOL) 325 MG tablet Take 650 mg by mouth every 4 (four) hours as needed for pain.      Marland Kitchen. aspirin 81 MG tablet Take 81 mg by mouth daily.       . cholecalciferol (VITAMIN D) 1000 UNITS tablet Take 1,000 Units by mouth daily.      . cloNIDine (CATAPRES) 0.2 MG tablet Take 0.3 mg by mouth 2 (two) times daily. Takes 1.5 tablets      . dextromethorphan-guaiFENesin (ROBITUSSIN-DM) 10-100 MG/5ML liquid Take 10 mLs by mouth every 4 (four) hours as needed for cough.      . DULoxetine (CYMBALTA) 60 MG capsule Take 1 capsule (60 mg total) by mouth daily.  90 capsule  0  . felodipine (PLENDIL) 10 MG 24 hr tablet Take 1 tablet (10 mg total) by mouth daily.  90 tablet  3  . hydrALAZINE (APRESOLINE) 50 MG tablet Take 50 mg by mouth 4 (four) times daily.       Marland Kitchen. lisinopril (PRINIVIL,ZESTRIL) 20 MG tablet Take 1 tablet (20 mg total) by mouth daily.  90 tablet  3  . LORazepam (ATIVAN) 0.5 MG tablet Take 0.5 mg by mouth 3 (three)  times daily.       Marland Kitchen. LORazepam (ATIVAN) 0.5 MG tablet Take 0.5 mg by mouth every 4 (four) hours as needed for anxiety. Not to be given within 4 hours of scheduled dose      . Memantine HCl ER (NAMENDA XR) 28 MG CP24 Take 28 mg by mouth daily.      . metoprolol succinate (TOPROL-XL) 100 MG 24 hr tablet Take 50 mg by mouth daily. At 5pm. Hold if pulse is less than 50 Take with or immediately following a meal.      . ondansetron (ZOFRAN) 4 MG tablet Take 4 mg by mouth every 6 (six) hours as needed for nausea or vomiting.      . simvastatin (ZOCOR) 20 MG tablet Take 1 tablet (20 mg total) by mouth daily.  90 tablet  3  . traMADol (ULTRAM) 50 MG tablet Take 50 mg by mouth every 6 (six) hours as needed (pain).      . traZODone (DESYREL) 50 MG tablet Take 1 tablet (50 mg total) by mouth as needed for sleep.  90 tablet  0  Current medications: Current Facility-Administered Medications  Medication Dose Route Frequency Provider Last Rate Last Dose  . acetaminophen (TYLENOL) tablet 650 mg  650 mg Oral Q4H PRN Renae Fickle, MD      . aspirin EC tablet 81 mg  81 mg Oral Daily Renae Fickle, MD   81 mg at 08/17/13 0946  . benzonatate (TESSALON) capsule 100 mg  100 mg Oral TID Renae Fickle, MD   100 mg at 08/17/13 2147  . cholecalciferol (VITAMIN D) tablet 1,000 Units  1,000 Units Oral Daily Renae Fickle, MD   1,000 Units at 08/17/13 [phone]  . cloNIDine (CATAPRES) tablet 0.3 mg  0.3 mg Oral BID Renae Fickle, MD   0.3 mg at 08/17/13 2147  . doxycycline (VIBRA-TABS) tablet 100 mg  100 mg Oral Q12H Renae Fickle, MD   100 mg at 08/17/13 2148  . DULoxetine (CYMBALTA) DR capsule 60 mg  60 mg Oral Daily Renae Fickle, MD   60 mg at 08/17/13 0946  . enoxaparin (LOVENOX) injection 40 mg  40 mg Subcutaneous Q24H Renae Fickle, MD   40 mg at 08/17/13 2148  . felodipine (PLENDIL) 24 hr tablet 10 mg  10 mg Oral Daily Renae Fickle, MD   10 mg at 08/17/13 0946  . guaiFENesin-dextromethorphan  (ROBITUSSIN DM) 100-10 MG/5ML syrup 10 mL  10 mL Oral Q4H PRN Renae Fickle, MD   10 mL at 08/18/13 0046  . hydrALAZINE (APRESOLINE) injection 10 mg  10 mg Intravenous Q4H PRN Renae Fickle, MD   10 mg at 08/16/13 2011  . hydrALAZINE (APRESOLINE) tablet 50 mg  50 mg Oral QID Renae Fickle, MD   50 mg at 08/17/13 2148  . insulin aspart (novoLOG) injection 0-20 Units  0-20 Units Subcutaneous TID WC Renae Fickle, MD      . insulin aspart (novoLOG) injection 0-5 Units  0-5 Units Subcutaneous QHS Renae Fickle, MD   3 Units at 08/16/13 2314  . insulin aspart (novoLOG) injection 5 Units  5 Units Subcutaneous TID WC Renae Fickle, MD      . insulin glargine (LANTUS) injection 10 Units  10 Units Subcutaneous QHS Renae Fickle, MD   10 Units at 08/17/13 2148  . ipratropium-albuterol (DUONEB) 0.5-2.5 (3) MG/3ML nebulizer solution 3 mL  3 mL Nebulization Q6H WA Renae Fickle, MD   3 mL at 08/18/13 0230  . lisinopril (PRINIVIL,ZESTRIL) tablet 20 mg  20 mg Oral Daily Renae Fickle, MD   20 mg at 08/17/13 0946  . LORazepam (ATIVAN) tablet 0.5 mg  0.5 mg Oral TID Renae Fickle, MD   0.5 mg at 08/17/13 2215  . LORazepam (ATIVAN) tablet 0.5 mg  0.5 mg Oral Q4H PRN Renae Fickle, MD   0.5 mg at 08/18/13 0546  . Memantine HCl ER CP24 28 mg  28 mg Oral q morning - 10a Renae Fickle, MD   28 mg at 08/17/13 0946  . metoprolol succinate (TOPROL-XL) 24 hr tablet 50 mg  50 mg Oral Daily Renae Fickle, MD   50 mg at 08/17/13 1649  . ondansetron (ZOFRAN) tablet 4 mg  4 mg Oral Q6H PRN Renae Fickle, MD      . predniSONE (DELTASONE) tablet 40 mg  40 mg Oral Q breakfast Renae Fickle, MD   40 mg at 08/16/13 2010  . simvastatin (ZOCOR) tablet 20 mg  20 mg Oral q1800 Renae Fickle, MD   20 mg at 08/17/13 1753  . traMADol (ULTRAM) tablet 50 mg  50 mg Oral Q6H PRN Renae Fickle,  MD      . traZODone (DESYREL) tablet 50 mg  50 mg Oral QHS PRN Renae Fickle, MD         Allergies  Allergen  Reactions  . Penicillins Anaphylaxis     Past Medical History  Diagnosis Date  . CORONARY ARTERY DISEASE 07/20/2010    with stent  . DEPRESSION 07/20/2010  . DM (diabetes mellitus) type II controlled, neurological manifestation 07/20/2010  . GERD 07/20/2010  . HYPERLIPIDEMIA 07/20/2010  . HYPERTENSION 07/20/2010  . LOW BACK PAIN 07/20/2010  . OSTEOARTHRITIS 07/20/2010  . URINARY INCONTINENCE 07/20/2010  . TIA (transient ischemic attack)   . CAD (coronary artery disease)     Stenting 2008 (no details)  . Dementia   . Hypokalemia   . Spinal stenosis   . Tobacco abuse   . Varicose vein of leg     Past Surgical History  Procedure Laterality Date  . Abdominal hysterectomy    . Vitrectomy Left   . Total knee arthroplasty    . Coronary stent placement    . Varicose vein surgery      Family History  Problem Relation Age of Onset  . Hypertension Mother   . Diabetes Mother   . Breast cancer Sister     Social History:  reports that she has been smoking Cigarettes.  She has been smoking about 0.50 packs per day. She has never used smokeless tobacco. She reports that she does not drink alcohol or use illicit drugs.    Physical Exam: BP 157/66  Pulse 89  Temp(Src) 98.3 F (36.8 C) (Oral)  Resp 20  Ht 5' (1.524 m)  SpO2 94%  Wt Readings from Last 3 Encounters:  04/04/13 143 lb (64.864 kg)  09/04/12 152 lb (68.947 kg)  07/24/12 143 lb (64.864 kg)    General: Vital signs reviewed and noted. Lying in bed  Head: Normocephalic, atraumatic, sclera anicteric,   Neck: Supple. Negative for carotid bruits. No JVD   Lungs:  Thick rhonchi and rales in right mid lung field and right base  Heart: RRR with S1 S2. Soft systolic murmur  Abdomen:  Soft, non-tender, non-distended with normoactive bowel sounds. No hepatomegaly. No rebound/guarding. No obvious abdominal masses   MSK: Strength and the appear normal for age.   Extremities: No clubbing or cyanosis. No edema.  Distal pedal pulses  are 2+ and equal   Neurologic: Alert and oriented X 3. Moves all extremities spontaneously.  Psych: Responds to questions appropriately with a normal affect.     Lab results: Basic Metabolic Panel:  Recent Labs Lab 08/16/13 1045 08/17/13 0310 08/18/13 0543  NA 138 140 139  K 3.7 4.1 3.8  CL 99 105 104  CO2 21 21 20   GLUCOSE 280* 282* 156*  BUN 14 17 21   CREATININE 0.67 0.72 0.82  CALCIUM 9.1 8.9 9.1    CBC:  Recent Labs Lab 08/16/13 1045 08/17/13 0310 08/18/13 0543  WBC 17.2* 17.3* 17.8*  NEUTROABS 15.1*  --   --   HGB 14.2 12.6 12.9  HCT 41.4 37.3 38.9  MCV 84.3 84.4 84.7  PLT 237 231 270    Cardiac Enzymes:  Recent Labs Lab 08/16/13 2100 08/17/13 0310 08/17/13 0832  TROPONINI <0.30 0.38* <0.30    BNP: No components found with this basename: POCBNP,   CBG:  Recent Labs Lab 08/16/13 2310 08/17/13 0814 08/17/13 1123 08/17/13 1618 08/17/13 2131  GLUCAP 283* 261* 336* 241* 111*    Coagulation Studies: No results  found for this basename: LABPROT, INR,  in the last 72 hours   Other results:  EKG:   NSR at 72, LBBB   Imaging: Dg Chest 2 View  08/16/2013   CLINICAL DATA:  Cough, congestion  EXAM: CHEST  2 VIEW  COMPARISON:  02/18/2013  FINDINGS: Borderline cardiomegaly. Degenerative changes thoracic spine again noted. No acute infiltrate or pulmonary edema. Central mild bronchitic changes.  IMPRESSION: No acute infiltrate or pulmonary edema. Central mild bronchitic changes.   Electronically Signed   By: Natasha Mead M.D.   On: 08/16/2013 10:25   Dg Chest Port 1 View  08/16/2013   CLINICAL DATA:  Acute respiratory distress.  Shortness breath.  EXAM: PORTABLE CHEST - 1 VIEW  COMPARISON:  Chest x-ray 08/16/2013.  FINDINGS: Ill-defined airspace consolidation in the right lung base. Left lung is clear. No pleural effusions. Mild cephalization of the pulmonary vasculature, without frank pulmonary edema. Mild cardiomegaly. Upper mediastinal contours are  within normal limits. Atherosclerosis in the thoracic aorta.  IMPRESSION: 1. Ill-defined airspace consolidation in the right lung base may either be within the right middle or lower lobes, concerning for early bronchopneumonia. 2. Mild cardiomegaly with pulmonary venous congestion, but no frank pulmonary edema.   Electronically Signed   By: Trudie Reed M.D.   On: 08/16/2013 20:50     Assessment & Plan:  1. + troponin:  Only 1 of the 4 troponin levels was elevated and was just barely elevated at that.  No indication that this is a ACS.  No new recs. Echo shows normal LV function  Will sign off.  She may see Dr. Antoine Poche at her previously scheduled apt.   Vesta Mixer, Montez Hageman., MD, [facility] 08/18/2013, 7:27 AM Office - [phone] Pager [phone]

## 2013-08-23 NOTE — ED Provider Notes (Signed)
Medical screening examination/treatment/procedure(s) were conducted as a shared visit with non-physician practitioner(s) and myself.  I personally evaluated the patient during the encounter.    I interviewed and examined the patient. Lungs w/ mild wheezing and faint rhonchi. Cardiac exam wnl. Abdomen soft.  CXR shows no infiltrate, but mildly elev lactate and mild tachypnea. Will admit to [facility] given pt's comorbidities.   Junius ArgyleForrest S Timica Marcom, MD 08/23/13 1056

## 2013-09-22 ENCOUNTER — Inpatient Hospital Stay (HOSPITAL_COMMUNITY)
Admission: EM | Admit: 2013-09-22 | Discharge: 2013-10-01 | DRG: 280 | Disposition: A | Discharge status: Home Health | Payer: Medicare Other | Attending: Internal Medicine | Admitting: Internal Medicine

## 2013-09-22 ENCOUNTER — Encounter (HOSPITAL_COMMUNITY): Payer: Self-pay | Admitting: Emergency Medicine

## 2013-09-22 ENCOUNTER — Emergency Department (HOSPITAL_COMMUNITY): Payer: Medicare Other

## 2013-09-22 DIAGNOSIS — Z9861 Coronary angioplasty status: Secondary | ICD-10-CM

## 2013-09-22 DIAGNOSIS — J9620 Acute and chronic respiratory failure, unspecified whether with hypoxia or hypercapnia: Secondary | ICD-10-CM | POA: Diagnosis present

## 2013-09-22 DIAGNOSIS — IMO0002 Reserved for concepts with insufficient information to code with codable children: Secondary | ICD-10-CM

## 2013-09-22 DIAGNOSIS — M1990 Unspecified osteoarthritis, unspecified site: Secondary | ICD-10-CM | POA: Diagnosis present

## 2013-09-22 DIAGNOSIS — F0150 Vascular dementia without behavioral disturbance: Secondary | ICD-10-CM | POA: Diagnosis present

## 2013-09-22 DIAGNOSIS — G8929 Other chronic pain: Secondary | ICD-10-CM | POA: Diagnosis present

## 2013-09-22 DIAGNOSIS — I509 Heart failure, unspecified: Secondary | ICD-10-CM | POA: Diagnosis present

## 2013-09-22 DIAGNOSIS — Z6826 Body mass index (BMI) 26.0-26.9, adult: Secondary | ICD-10-CM

## 2013-09-22 DIAGNOSIS — I214 Non-ST elevation (NSTEMI) myocardial infarction: Secondary | ICD-10-CM | POA: Diagnosis present

## 2013-09-22 DIAGNOSIS — F329 Major depressive disorder, single episode, unspecified: Secondary | ICD-10-CM

## 2013-09-22 DIAGNOSIS — I472 Ventricular tachycardia, unspecified: Secondary | ICD-10-CM | POA: Diagnosis present

## 2013-09-22 DIAGNOSIS — R32 Unspecified urinary incontinence: Secondary | ICD-10-CM

## 2013-09-22 DIAGNOSIS — Z833 Family history of diabetes mellitus: Secondary | ICD-10-CM

## 2013-09-22 DIAGNOSIS — E876 Hypokalemia: Secondary | ICD-10-CM | POA: Diagnosis present

## 2013-09-22 DIAGNOSIS — Z8249 Family history of ischemic heart disease and other diseases of the circulatory system: Secondary | ICD-10-CM

## 2013-09-22 DIAGNOSIS — Z7982 Long term (current) use of aspirin: Secondary | ICD-10-CM

## 2013-09-22 DIAGNOSIS — F3289 Other specified depressive episodes: Secondary | ICD-10-CM

## 2013-09-22 DIAGNOSIS — I5043 Acute on chronic combined systolic (congestive) and diastolic (congestive) heart failure: Principal | ICD-10-CM | POA: Diagnosis present

## 2013-09-22 DIAGNOSIS — I4729 Other ventricular tachycardia: Secondary | ICD-10-CM | POA: Diagnosis present

## 2013-09-22 DIAGNOSIS — I4891 Unspecified atrial fibrillation: Secondary | ICD-10-CM | POA: Diagnosis not present

## 2013-09-22 DIAGNOSIS — R112 Nausea with vomiting, unspecified: Secondary | ICD-10-CM | POA: Diagnosis present

## 2013-09-22 DIAGNOSIS — F32A Depression, unspecified: Secondary | ICD-10-CM

## 2013-09-22 DIAGNOSIS — N390 Urinary tract infection, site not specified: Secondary | ICD-10-CM

## 2013-09-22 DIAGNOSIS — E1149 Type 2 diabetes mellitus with other diabetic neurological complication: Secondary | ICD-10-CM | POA: Diagnosis present

## 2013-09-22 DIAGNOSIS — Z880 Allergy status to penicillin: Secondary | ICD-10-CM

## 2013-09-22 DIAGNOSIS — M4800 Spinal stenosis, site unspecified: Secondary | ICD-10-CM | POA: Diagnosis present

## 2013-09-22 DIAGNOSIS — E119 Type 2 diabetes mellitus without complications: Secondary | ICD-10-CM | POA: Diagnosis present

## 2013-09-22 DIAGNOSIS — I2510 Atherosclerotic heart disease of native coronary artery without angina pectoris: Secondary | ICD-10-CM | POA: Diagnosis present

## 2013-09-22 DIAGNOSIS — I5031 Acute diastolic (congestive) heart failure: Secondary | ICD-10-CM

## 2013-09-22 DIAGNOSIS — R062 Wheezing: Secondary | ICD-10-CM | POA: Diagnosis present

## 2013-09-22 DIAGNOSIS — R5381 Other malaise: Secondary | ICD-10-CM | POA: Diagnosis present

## 2013-09-22 DIAGNOSIS — J441 Chronic obstructive pulmonary disease with (acute) exacerbation: Secondary | ICD-10-CM | POA: Diagnosis present

## 2013-09-22 DIAGNOSIS — Z8673 Personal history of transient ischemic attack (TIA), and cerebral infarction without residual deficits: Secondary | ICD-10-CM

## 2013-09-22 DIAGNOSIS — R4182 Altered mental status, unspecified: Secondary | ICD-10-CM

## 2013-09-22 DIAGNOSIS — F17200 Nicotine dependence, unspecified, uncomplicated: Secondary | ICD-10-CM | POA: Diagnosis present

## 2013-09-22 DIAGNOSIS — F05 Delirium due to known physiological condition: Secondary | ICD-10-CM

## 2013-09-22 DIAGNOSIS — E785 Hyperlipidemia, unspecified: Secondary | ICD-10-CM | POA: Diagnosis present

## 2013-09-22 DIAGNOSIS — R079 Chest pain, unspecified: Secondary | ICD-10-CM

## 2013-09-22 DIAGNOSIS — D72829 Elevated white blood cell count, unspecified: Secondary | ICD-10-CM | POA: Diagnosis present

## 2013-09-22 DIAGNOSIS — R5383 Other fatigue: Secondary | ICD-10-CM

## 2013-09-22 DIAGNOSIS — K219 Gastro-esophageal reflux disease without esophagitis: Secondary | ICD-10-CM | POA: Diagnosis present

## 2013-09-22 DIAGNOSIS — Z803 Family history of malignant neoplasm of breast: Secondary | ICD-10-CM

## 2013-09-22 DIAGNOSIS — T380X5A Adverse effect of glucocorticoids and synthetic analogues, initial encounter: Secondary | ICD-10-CM | POA: Diagnosis present

## 2013-09-22 DIAGNOSIS — A419 Sepsis, unspecified organism: Secondary | ICD-10-CM

## 2013-09-22 DIAGNOSIS — J189 Pneumonia, unspecified organism: Secondary | ICD-10-CM

## 2013-09-22 DIAGNOSIS — I10 Essential (primary) hypertension: Secondary | ICD-10-CM | POA: Diagnosis present

## 2013-09-22 DIAGNOSIS — R748 Abnormal levels of other serum enzymes: Secondary | ICD-10-CM | POA: Diagnosis present

## 2013-09-22 DIAGNOSIS — Z96659 Presence of unspecified artificial knee joint: Secondary | ICD-10-CM

## 2013-09-22 DIAGNOSIS — F0390 Unspecified dementia without behavioral disturbance: Secondary | ICD-10-CM | POA: Diagnosis present

## 2013-09-22 LAB — BLOOD GAS, ARTERIAL
ACID-BASE DEFICIT: 8.2 mmol/L — AB (ref 0.0–2.0)
Bicarbonate: 17.7 mEq/L — ABNORMAL LOW (ref 20.0–24.0)
Drawn by: 31814
FIO2: 0.55 %
O2 SAT: 94.6 %
Patient temperature: 98.6
TCO2: 16.1 mmol/L (ref 0–100)
pCO2 arterial: 39.5 mmHg (ref 35.0–45.0)
pH, Arterial: 7.275 — ABNORMAL LOW (ref 7.350–7.450)
pO2, Arterial: 87.7 mmHg (ref 80.0–100.0)

## 2013-09-22 LAB — COMPREHENSIVE METABOLIC PANEL
ALK PHOS: 120 U/L — AB (ref 39–117)
ALT: 18 U/L (ref 0–35)
AST: 24 U/L (ref 0–37)
Albumin: 3.5 g/dL (ref 3.5–5.2)
BUN: 12 mg/dL (ref 6–23)
CO2: 21 mEq/L (ref 19–32)
CREATININE: 0.81 mg/dL (ref 0.50–1.10)
Calcium: 9 mg/dL (ref 8.4–10.5)
Chloride: 102 mEq/L (ref 96–112)
GFR, EST AFRICAN AMERICAN: 78 mL/min — AB (ref >90)
GFR, EST NON AFRICAN AMERICAN: 67 mL/min — AB (ref >90)
GLUCOSE: 137 mg/dL — AB (ref 70–99)
POTASSIUM: 3.8 meq/L (ref 3.7–5.3)
Sodium: 139 mEq/L (ref 137–147)
Total Bilirubin: 0.5 mg/dL (ref 0.3–1.2)
Total Protein: 7.1 g/dL (ref 6.0–8.3)

## 2013-09-22 LAB — CBC
HCT: 37.2 % (ref 36.0–46.0)
HEMOGLOBIN: 12.3 g/dL (ref 12.0–15.0)
MCH: 27.7 pg (ref 26.0–34.0)
MCHC: 33.1 g/dL (ref 30.0–36.0)
MCV: 83.8 fL (ref 78.0–100.0)
Platelets: 269 10*3/uL (ref 150–400)
RBC: 4.44 MIL/uL (ref 3.87–5.11)
RDW: 13.9 % (ref 11.5–15.5)
WBC: 9.7 10*3/uL (ref 4.0–10.5)

## 2013-09-22 LAB — LACTIC ACID, PLASMA: Lactic Acid, Venous: 2.4 mmol/L — ABNORMAL HIGH (ref 0.5–2.2)

## 2013-09-22 LAB — PRO B NATRIURETIC PEPTIDE: Pro B Natriuretic peptide (BNP): 4360 pg/mL — ABNORMAL HIGH (ref 0–450)

## 2013-09-22 LAB — TROPONIN I: Troponin I: <0.3 ng/mL (ref <0.30)

## 2013-09-22 MED ORDER — ALBUTEROL SULFATE (2.5 MG/3ML) 0.083% IN NEBU: 2.5000 mg | INHALATION_SOLUTION | RESPIRATORY_TRACT | Status: DC | PRN

## 2013-09-22 MED ORDER — SODIUM CHLORIDE 0.9 % IV SOLN
INTRAVENOUS | Status: DC
  Administered 2013-09-22 – 2013-09-27 (×3): via INTRAVENOUS

## 2013-09-22 MED ORDER — LORAZEPAM 2 MG/ML IJ SOLN
0.5000 mg | Freq: Once | INTRAMUSCULAR | Status: AC
  Administered 2013-09-22: 0.5 mg via INTRAVENOUS
  Filled 2013-09-22: qty 1

## 2013-09-22 MED ORDER — ALBUTEROL SULFATE (2.5 MG/3ML) 0.083% IN NEBU
5.0000 mg | INHALATION_SOLUTION | Freq: Once | RESPIRATORY_TRACT | Status: AC
  Administered 2013-09-22: 5 mg via RESPIRATORY_TRACT
  Filled 2013-09-22: qty 6

## 2013-09-22 MED ORDER — IPRATROPIUM BROMIDE 0.02 % IN SOLN: 0.5000 mg | Freq: Once | RESPIRATORY_TRACT | Status: DC

## 2013-09-22 MED ORDER — IPRATROPIUM BROMIDE 0.02 % IN SOLN
0.5000 mg | Freq: Once | RESPIRATORY_TRACT | Status: AC
  Administered 2013-09-22: 0.5 mg via RESPIRATORY_TRACT
  Filled 2013-09-22: qty 2.5

## 2013-09-22 MED ORDER — AZTREONAM 2 G IJ SOLR
2.0000 g | Freq: Once | INTRAMUSCULAR | Status: AC
  Administered 2013-09-22: 2 g via INTRAVENOUS

## 2013-09-22 MED ORDER — ALBUTEROL (5 MG/ML) CONTINUOUS INHALATION SOLN
10.0000 mg/h | INHALATION_SOLUTION | Freq: Once | RESPIRATORY_TRACT | Status: AC
  Administered 2013-09-22: 10 mg/h via RESPIRATORY_TRACT
  Filled 2013-09-22: qty 20

## 2013-09-22 MED ORDER — ALBUTEROL SULFATE (2.5 MG/3ML) 0.083% IN NEBU: 5.0000 mg | INHALATION_SOLUTION | Freq: Once | RESPIRATORY_TRACT | Status: DC

## 2013-09-22 MED ORDER — METHYLPREDNISOLONE SODIUM SUCC 125 MG IJ SOLR
125.0000 mg | Freq: Once | INTRAMUSCULAR | Status: AC
  Administered 2013-09-22: 125 mg via INTRAVENOUS
  Filled 2013-09-22: qty 2

## 2013-09-22 MED ORDER — VANCOMYCIN HCL IN DEXTROSE 1-5 GM/200ML-% IV SOLN
1000.0000 mg | Freq: Once | INTRAVENOUS | Status: AC
  Administered 2013-09-22: 1000 mg via INTRAVENOUS
  Filled 2013-09-22: qty 200

## 2013-09-22 MED ORDER — FUROSEMIDE 10 MG/ML IJ SOLN
40.0000 mg | Freq: Once | INTRAMUSCULAR | Status: AC
  Administered 2013-09-22: 40 mg via INTRAVENOUS
  Filled 2013-09-22: qty 4

## 2013-09-22 NOTE — ED Provider Notes (Addendum)
CSN: [id number]     Arrival date & time 09/22/13  1809 History   First MD Initiated Contact with Patient 09/22/13 1920     Chief Complaint  Patient presents with  . Shortness of Breath     (Consider location/radiation/quality/duration/timing/severity/associated sxs/prior Treatment) Patient is a 80 y.o. female presenting with shortness of breath. The history is provided by the patient and a relative.  Shortness of Breath Associated symptoms: cough, fever and wheezing   Associated symptoms: no abdominal pain, no chest pain, no headaches, no neck pain, no rash, no sore throat and no vomiting   pt with hx dementia, htn, pna, copd, c/o fevers in the past 1-2 days. States recent non prod cough, sob. No sore throat or runny nose. Also states urine has odor/cloudy. No dysuria. No abd or flank pain. No nvd. Denies headache. No neck pain or stiffness. No chest pain. No rash. No known ill contacts, resides in [facility].      Past Medical History  Diagnosis Date  . CORONARY ARTERY DISEASE 07/20/2010    with stent  . DEPRESSION 07/20/2010  . DM (diabetes mellitus) type II controlled, neurological manifestation 07/20/2010  . GERD 07/20/2010  . HYPERLIPIDEMIA 07/20/2010  . HYPERTENSION 07/20/2010  . LOW BACK PAIN 07/20/2010  . OSTEOARTHRITIS 07/20/2010  . URINARY INCONTINENCE 07/20/2010  . TIA (transient ischemic attack)   . CAD (coronary artery disease)     Stenting 2008 (no details)  . Dementia   . Hypokalemia   . Spinal stenosis   . Tobacco abuse   . Varicose vein of leg    Past Surgical History  Procedure Laterality Date  . Abdominal hysterectomy    . Vitrectomy Left   . Total knee arthroplasty    . Coronary stent placement    . Varicose vein surgery     Family History  Problem Relation Age of Onset  . Hypertension Mother   . Diabetes Mother   . Breast cancer Sister    History  Substance Use Topics  . Smoking status: Current Every Day Smoker -- 0.50 packs/day    Types: Cigarettes  .  Smokeless tobacco: Never Used     Comment: smokes 2-3 per day  . Alcohol Use: No   OB History   Grav Para Term Preterm Abortions TAB SAB Ect Mult Living                 Review of Systems  Constitutional: Positive for fever. Negative for chills.  HENT: Negative for sore throat.   Eyes: Negative for discharge and redness.  Respiratory: Positive for cough, shortness of breath and wheezing.   Cardiovascular: Negative for chest pain.  Gastrointestinal: Negative for vomiting, abdominal pain, diarrhea and constipation.  Genitourinary: Negative for dysuria and flank pain.  Musculoskeletal: Negative for back pain and neck pain.  Skin: Negative for rash.  Neurological: Negative for headaches.  Hematological: Does not bruise/bleed easily.  Psychiatric/Behavioral: Negative for confusion.      Allergies  Penicillins  Home Medications   Current Outpatient Rx  Name  Route  Sig  Dispense  Refill  . acetaminophen (TYLENOL) 325 MG tablet   Oral   Take 650 mg by mouth every 4 (four) hours as needed for pain.         Marland Kitchen albuterol (PROVENTIL) (2.5 MG/3ML) 0.083% nebulizer solution   Nebulization   Take 3 mLs (2.5 mg total) by nebulization every 4 (four) hours as needed for wheezing or shortness of breath.   75  mL   0   . aspirin 81 MG tablet   Oral   Take 81 mg by mouth daily.          . benzonatate (TESSALON) 100 MG capsule   Oral   Take 1 capsule (100 mg total) by mouth 3 (three) times daily.   20 capsule   0   . cholecalciferol (VITAMIN D) 1000 UNITS tablet   Oral   Take 1,000 Units by mouth daily.         . cloNIDine (CATAPRES) 0.2 MG tablet   Oral   Take 0.3 mg by mouth 2 (two) times daily. Takes 1.5 tablets         . dextromethorphan-guaiFENesin (ROBITUSSIN-DM) 10-100 MG/5ML liquid   Oral   Take 10 mLs by mouth every 4 (four) hours as needed for cough.         . doxycycline (VIBRA-TABS) 100 MG tablet   Oral   Take 1 tablet (100 mg total) by mouth every 12  (twelve) hours.   14 tablet   0   . DULoxetine (CYMBALTA) 60 MG capsule   Oral   Take 1 capsule (60 mg total) by mouth daily.   90 capsule   0   . felodipine (PLENDIL) 10 MG 24 hr tablet   Oral   Take 1 tablet (10 mg total) by mouth daily.   90 tablet   3   . hydrALAZINE (APRESOLINE) 50 MG tablet   Oral   Take 50 mg by mouth 4 (four) times daily.          . insulin aspart (NOVOLOG) 100 unit/mL injection   Subcutaneous   Inject 0-15 Units into the skin 3 (three) times daily before meals.   1 vial   0     250.00.  Dispense with insulin needles and syringe ...   . ipratropium (ATROVENT) 0.02 % nebulizer solution   Nebulization   Take 2.5 mLs (0.5 mg total) by nebulization 4 (four) times daily.   75 mL   0   . lisinopril (PRINIVIL,ZESTRIL) 20 MG tablet   Oral   Take 1 tablet (20 mg total) by mouth daily.   90 tablet   3   . LORazepam (ATIVAN) 0.5 MG tablet   Oral   Take 0.5 mg by mouth 3 (three) times daily.          Marland Kitchen LORazepam (ATIVAN) 1 MG tablet   Oral   Take 1 tablet (1 mg total) by mouth every 4 (four) hours as needed for anxiety or sleep. Not to be given within 4 hours of scheduled dose   30 tablet   0   . Memantine HCl ER (NAMENDA XR) 28 MG CP24   Oral   Take 28 mg by mouth daily.         . metoprolol succinate (TOPROL-XL) 100 MG 24 hr tablet   Oral   Take 50 mg by mouth daily. At 5pm. Hold if pulse is less than 50 Take with or immediately following a meal.         . ondansetron (ZOFRAN) 4 MG tablet   Oral   Take 4 mg by mouth every 6 (six) hours as needed for nausea or vomiting.         . predniSONE (DELTASONE) 10 MG tablet      Take 3 tabs daily x 3 days, then 2 tabs daily x 3 days, then 1 tab daily x 3 days, then stop.  18 tablet   0   . simvastatin (ZOCOR) 20 MG tablet   Oral   Take 1 tablet (20 mg total) by mouth daily.   90 tablet   3   . traMADol (ULTRAM) 50 MG tablet   Oral   Take 50 mg by mouth every 6 (six) hours as  needed (pain).         . traZODone (DESYREL) 50 MG tablet   Oral   Take 1 tablet (50 mg total) by mouth at bedtime and may repeat dose one time if needed.   90 tablet   0    BP 178/82  Pulse 75  Temp(Src) 100.9 F (38.3 C) (Oral)  Resp 20  SpO2 94% Physical Exam  Nursing note and vitals reviewed. Constitutional: She is oriented to person, place, and time. She appears well-developed and well-nourished. No distress.  HENT:  Nose: Nose normal.  Mouth/Throat: Oropharynx is clear and moist.  Eyes: Conjunctivae are normal. Pupils are equal, round, and reactive to light. No scleral icterus.  Neck: Neck supple. No tracheal deviation present.  No stiffness or rigidity  Cardiovascular: Normal rate, regular rhythm, normal heart sounds and intact distal pulses.  Exam reveals no gallop and no friction rub.   No murmur heard. Pulmonary/Chest: Effort normal. No respiratory distress.  Rhonchi, esp r. Wheezing.   Abdominal: Soft. Normal appearance and bowel sounds are normal. She exhibits no distension and no mass. There is no tenderness. There is no rebound and no guarding.  Genitourinary:  No cva tenderness  Musculoskeletal: Normal range of motion. She exhibits no tenderness.  Neurological: She is alert and oriented to person, place, and time.  Skin: Skin is warm and dry. No rash noted.  Psychiatric: She has a normal mood and affect.    ED Course  Procedures (including critical care time)   Results for orders placed during the hospital encounter of 09/22/13  CBC      Result Value Ref Range   WBC 9.7  4.0 - 10.5 K/uL   RBC 4.44  3.87 - 5.11 MIL/uL   Hemoglobin 12.3  12.0 - 15.0 g/dL   HCT 16.1  09.6 - 04.5 %   MCV 83.8  78.0 - 100.0 fL   MCH 27.7  26.0 - 34.0 pg   MCHC 33.1  30.0 - 36.0 g/dL   RDW 40.9  81.1 - 91.4 %   Platelets 269  150 - 400 K/uL  COMPREHENSIVE METABOLIC PANEL      Result Value Ref Range   Sodium 139  137 - 147 mEq/L   Potassium 3.8  3.7 - 5.3 mEq/L    Chloride 102  96 - 112 mEq/L   CO2 21  19 - 32 mEq/L   Glucose, Bld 137 (*) 70 - 99 mg/dL   BUN 12  6 - 23 mg/dL   Creatinine, Ser 7.82  0.50 - 1.10 mg/dL   Calcium 9.0  8.4 - 95.6 mg/dL   Total Protein 7.1  6.0 - 8.3 g/dL   Albumin 3.5  3.5 - 5.2 g/dL   AST 24  0 - 37 U/L   ALT 18  0 - 35 U/L   Alkaline Phosphatase 120 (*) 39 - 117 U/L   Total Bilirubin 0.5  0.3 - 1.2 mg/dL   GFR calc non Af Amer 67 (*) >90 mL/min   GFR calc Af Amer 78 (*) >90 mL/min  LACTIC ACID, PLASMA      Result Value Ref Range  Lactic Acid, Venous 2.4 (*) 0.5 - 2.2 mmol/L  BLOOD GAS, ARTERIAL      Result Value Ref Range   FIO2 0.55     Delivery systems VENTURI MASK     pH, Arterial 7.275 (*) 7.350 - 7.450   pCO2 arterial 39.5  35.0 - 45.0 mmHg   pO2, Arterial 87.7  80.0 - 100.0 mmHg   Bicarbonate 17.7 (*) 20.0 - 24.0 mEq/L   TCO2 16.1  0 - 100 mmol/L   Acid-base deficit 8.2 (*) 0.0 - 2.0 mmol/L   O2 Saturation 94.6     Patient temperature 98.6     Collection site RIGHT RADIAL     Drawn by [phone]     Sample type ARTERIAL     Allens test (pass/fail) PASS  PASS   Dg Chest 2 View  09/22/2013   CLINICAL DATA:  Shortness of breath, pneumonia  EXAM: CHEST  2 VIEW  COMPARISON:  DG CHEST 1V PORT dated 08/16/2013  FINDINGS: There is no focal parenchymal opacity, pleural effusion, or pneumothorax. Stable cardiomegaly.  There is mild thoracic spine spondylosis.  IMPRESSION: No active cardiopulmonary disease.   Electronically Signed   By: Elige KoHetal  Patel   On: 09/22/2013 19:04        Date: 09/22/2013  Rate: 120  Rhythm: sinus tachycardia  QRS Axis: left  Intervals: normal  ST/T Wave abnormalities: nonspecific ST/T changes  Conduction Disutrbances:left bundle branch block  Narrative Interpretation:   Old EKG Reviewed: LBBB noted on prior ecg    MDM  Iv ns. Labs. Cxr. Ua.  Reviewed nursing notes and prior charts for additional history.   Albuterol and atrovent neb.  Recheck, persistent wheezing,  increased dyspnea compared to prior. Diffuse wheezing, rhonchi r. Suspect hcap, although chest xray not documenting infiltrate.  Given fever, lung exam, will rx for presumed hcap, allergy to pcn, aztreonam and vanc.   pts bp higher than previous (207/100), pt anxious, diffuse wheezing.  Pt coached on slow/deep breathing, ativan .5 mg iv for anxiety, additional alb/atrovent neb.  Persistent wheezing. Continuous alb neb  Recheck pt improved. 160/78.   bnp and trop remain pending.  ua pending. Reviewed prior med service admission during which pt noted to have episode increased dyspnea/acute chf associated with markedly elevated blood pressure, - whether similar process this evening.   Med service contact to admit to stepdown.   CRITICAL CARE  RE:  Copd exacerbation, HCAP, acute on chronic resp failure, fever. CHF, Performed by: Suzi RootsSTEINL,Mariely Mahr E Total critical care time: 40 Critical care time was exclusive of separately billable procedures and treating other patients. Critical care was necessary to treat or prevent imminent or life-threatening deterioration. Critical care was time spent personally by me on the following activities: development of treatment plan with patient and/or surrogate as well as nursing, discussions with consultants, evaluation of patient's response to treatment, examination of patient, obtaining history from patient or surrogate, ordering and performing treatments and interventions, ordering and review of laboratory studies, ordering and review of radiographic studies, pulse oximetry and re-evaluation of patient's condition.  bnp elevated, above prior.  Lasix iv. Bipap.   Recheck bp 139/62.  Pt breathing more comfortably than prior.   Discussed w [facility] hospitalist - will admit.  Requests temp orders, stepdown.          Suzi RootsKevin E Morna Flud, MD 09/22/13 [phone]

## 2013-09-22 NOTE — ED Notes (Signed)
RT called for breathing tx. 

## 2013-09-22 NOTE — ED Notes (Signed)
Initial contact - pt rec'd at this time, RT at bs to collect ABG.  2nd IV access obtained, labs drawn.  Pt with continuous neb going at this time.  Pt very tachypneic, inc work of breathing, speaking one word at a time, tripod position.  Audible wheezing noted, +rhonci/wheezing throughout.  Pt denies CP.  A+Ox4. ST on monitor.

## 2013-09-22 NOTE — ED Notes (Signed)
She c/o cough plus shortness of breath which feel "just like pneumonia--I was in the hospital for pneumonia not long ago".  She is in no distress.  Her daughter also mentions that pt's. Urine "smells very foul".

## 2013-09-23 ENCOUNTER — Inpatient Hospital Stay (HOSPITAL_COMMUNITY): Payer: Medicare Other

## 2013-09-23 ENCOUNTER — Encounter (HOSPITAL_COMMUNITY): Payer: Self-pay | Admitting: *Deleted

## 2013-09-23 DIAGNOSIS — J189 Pneumonia, unspecified organism: Secondary | ICD-10-CM

## 2013-09-23 DIAGNOSIS — J441 Chronic obstructive pulmonary disease with (acute) exacerbation: Secondary | ICD-10-CM

## 2013-09-23 DIAGNOSIS — I5031 Acute diastolic (congestive) heart failure: Secondary | ICD-10-CM

## 2013-09-23 DIAGNOSIS — J9620 Acute and chronic respiratory failure, unspecified whether with hypoxia or hypercapnia: Secondary | ICD-10-CM | POA: Diagnosis present

## 2013-09-23 DIAGNOSIS — I509 Heart failure, unspecified: Secondary | ICD-10-CM

## 2013-09-23 LAB — TROPONIN I: Troponin I: 0.7 ng/mL (ref <0.30)

## 2013-09-23 LAB — PROTIME-INR
INR: 1.11 (ref 0.00–1.49)
Prothrombin Time: 14.1 seconds (ref 11.6–15.2)

## 2013-09-23 LAB — GLUCOSE, CAPILLARY
Glucose-Capillary: 419 mg/dL — ABNORMAL HIGH (ref 70–99)
Glucose-Capillary: 247 mg/dL — ABNORMAL HIGH (ref 70–99)
Glucose-Capillary: 311 mg/dL — ABNORMAL HIGH (ref 70–99)

## 2013-09-23 LAB — URINALYSIS, ROUTINE W REFLEX MICROSCOPIC
Bilirubin Urine: NEGATIVE
GLUCOSE, UA: 500 mg/dL — AB
HGB URINE DIPSTICK: NEGATIVE
KETONES UR: NEGATIVE mg/dL
LEUKOCYTES UA: NEGATIVE
Nitrite: NEGATIVE
Protein, ur: NEGATIVE mg/dL
Specific Gravity, Urine: 1.007 (ref 1.005–1.030)
Urobilinogen, UA: 0.2 mg/dL (ref 0.0–1.0)
pH: 5 (ref 5.0–8.0)

## 2013-09-23 LAB — CBC
HEMATOCRIT: 40 % (ref 36.0–46.0)
Hemoglobin: 13.2 g/dL (ref 12.0–15.0)
MCH: 27.9 pg (ref 26.0–34.0)
MCHC: 33 g/dL (ref 30.0–36.0)
MCV: 84.6 fL (ref 78.0–100.0)
PLATELETS: 231 10*3/uL (ref 150–400)
RBC: 4.73 MIL/uL (ref 3.87–5.11)
RDW: 13.8 % (ref 11.5–15.5)
WBC: 14.5 10*3/uL — AB (ref 4.0–10.5)

## 2013-09-23 LAB — COMPREHENSIVE METABOLIC PANEL
ALBUMIN: 3.5 g/dL (ref 3.5–5.2)
ALK PHOS: 122 U/L — AB (ref 39–117)
ALT: 22 U/L (ref 0–35)
AST: 34 U/L (ref 0–37)
BILIRUBIN TOTAL: 0.6 mg/dL (ref 0.3–1.2)
BUN: 23 mg/dL (ref 6–23)
CHLORIDE: 90 meq/L — AB (ref 96–112)
CO2: 19 mEq/L (ref 19–32)
Calcium: 9.4 mg/dL (ref 8.4–10.5)
Creatinine, Ser: 1 mg/dL (ref 0.50–1.10)
GFR calc Af Amer: 60 mL/min — ABNORMAL LOW (ref >90)
GFR calc non Af Amer: 52 mL/min — ABNORMAL LOW (ref >90)
Glucose, Bld: 230 mg/dL — ABNORMAL HIGH (ref 70–99)
POTASSIUM: 2.7 meq/L — AB (ref 3.7–5.3)
Sodium: 133 mEq/L — ABNORMAL LOW (ref 137–147)
Total Protein: 7.6 g/dL (ref 6.0–8.3)

## 2013-09-23 LAB — PROCALCITONIN: Procalcitonin: 5.31 ng/mL

## 2013-09-23 LAB — TSH: TSH: 0.54 u[IU]/mL (ref 0.350–4.500)

## 2013-09-23 LAB — PRO B NATRIURETIC PEPTIDE: Pro B Natriuretic peptide (BNP): 9737 pg/mL — ABNORMAL HIGH (ref 0–450)

## 2013-09-23 LAB — CREATININE, SERUM
Creatinine, Ser: 1.01 mg/dL (ref 0.50–1.10)
GFR calc non Af Amer: 52 mL/min — ABNORMAL LOW (ref >90)
GFR, EST AFRICAN AMERICAN: 60 mL/min — AB (ref >90)

## 2013-09-23 LAB — MAGNESIUM: MAGNESIUM: 1.7 mg/dL (ref 1.5–2.5)

## 2013-09-23 LAB — MRSA PCR SCREENING: MRSA BY PCR: NEGATIVE

## 2013-09-23 MED ORDER — METOPROLOL SUCCINATE ER 50 MG PO TB24
50.0000 mg | ORAL_TABLET | Freq: Every day | ORAL | Status: DC
  Filled 2013-09-23: qty 1

## 2013-09-23 MED ORDER — FUROSEMIDE 10 MG/ML IJ SOLN
40.0000 mg | Freq: Two times a day (BID) | INTRAMUSCULAR | Status: DC
  Administered 2013-09-23 – 2013-09-25 (×4): 40 mg via INTRAVENOUS
  Filled 2013-09-23 (×6): qty 4

## 2013-09-23 MED ORDER — MORPHINE SULFATE 2 MG/ML IJ SOLN
INTRAMUSCULAR | Status: AC
  Administered 2013-09-23: 2 mg via INTRAVENOUS
  Filled 2013-09-23: qty 1

## 2013-09-23 MED ORDER — IPRATROPIUM-ALBUTEROL 0.5-2.5 (3) MG/3ML IN SOLN
3.0000 mL | Freq: Four times a day (QID) | RESPIRATORY_TRACT | Status: DC
  Administered 2013-09-23: 3 mL via RESPIRATORY_TRACT
  Filled 2013-09-23: qty 3

## 2013-09-23 MED ORDER — DILTIAZEM HCL 25 MG/5ML IV SOLN
5.0000 mg | Freq: Once | INTRAVENOUS | Status: DC
  Filled 2013-09-23: qty 5

## 2013-09-23 MED ORDER — LEVALBUTEROL HCL 0.63 MG/3ML IN NEBU
0.6300 mg | INHALATION_SOLUTION | RESPIRATORY_TRACT | Status: DC | PRN
  Filled 2013-09-23: qty 3

## 2013-09-23 MED ORDER — ASPIRIN EC 81 MG PO TBEC
81.0000 mg | DELAYED_RELEASE_TABLET | Freq: Every day | ORAL | Status: DC
  Administered 2013-09-23 – 2013-10-01 (×9): 81 mg via ORAL
  Filled 2013-09-23 (×9): qty 1

## 2013-09-23 MED ORDER — ACETAMINOPHEN 325 MG PO TABS
650.0000 mg | ORAL_TABLET | ORAL | Status: DC | PRN
  Administered 2013-09-23 (×2): 650 mg via ORAL
  Filled 2013-09-23 (×3): qty 2

## 2013-09-23 MED ORDER — SENNA 8.6 MG PO TABS
1.0000 | ORAL_TABLET | Freq: Two times a day (BID) | ORAL | Status: DC
  Administered 2013-09-23 – 2013-10-01 (×16): 8.6 mg via ORAL
  Filled 2013-09-23 (×17): qty 1

## 2013-09-23 MED ORDER — ENOXAPARIN SODIUM 40 MG/0.4ML ~~LOC~~ SOLN
40.0000 mg | SUBCUTANEOUS | Status: DC
  Administered 2013-09-23: 40 mg via SUBCUTANEOUS
  Filled 2013-09-23: qty 0.4

## 2013-09-23 MED ORDER — HEPARIN BOLUS VIA INFUSION
3000.0000 [IU] | Freq: Once | INTRAVENOUS | Status: AC
  Administered 2013-09-23: 3000 [IU] via INTRAVENOUS
  Filled 2013-09-23: qty 3000

## 2013-09-23 MED ORDER — PANTOPRAZOLE SODIUM 40 MG PO TBEC
40.0000 mg | DELAYED_RELEASE_TABLET | Freq: Every day | ORAL | Status: DC
  Administered 2013-09-24 – 2013-10-01 (×8): 40 mg via ORAL
  Filled 2013-09-23 (×8): qty 1

## 2013-09-23 MED ORDER — SODIUM CHLORIDE 0.9 % IJ SOLN
3.0000 mL | Freq: Two times a day (BID) | INTRAMUSCULAR | Status: DC
  Administered 2013-09-23 – 2013-10-01 (×14): 3 mL via INTRAVENOUS

## 2013-09-23 MED ORDER — MEMANTINE HCL ER 7 MG PO CP24
28.0000 mg | ORAL_CAPSULE | Freq: Every day | ORAL | Status: DC
  Administered 2013-09-23 – 2013-10-01 (×9): 28 mg via ORAL
  Filled 2013-09-23 (×9): qty 4

## 2013-09-23 MED ORDER — VITAMIN D3 25 MCG (1000 UNIT) PO TABS
1000.0000 [IU] | ORAL_TABLET | Freq: Every day | ORAL | Status: DC
  Administered 2013-09-23 – 2013-10-01 (×9): 1000 [IU] via ORAL
  Filled 2013-09-23 (×9): qty 1

## 2013-09-23 MED ORDER — FELODIPINE ER 10 MG PO TB24
10.0000 mg | ORAL_TABLET | Freq: Every day | ORAL | Status: DC
  Filled 2013-09-23: qty 1

## 2013-09-23 MED ORDER — PANTOPRAZOLE SODIUM 40 MG IV SOLR
40.0000 mg | Freq: Once | INTRAVENOUS | Status: AC
  Administered 2013-09-23: 40 mg via INTRAVENOUS
  Filled 2013-09-23: qty 40

## 2013-09-23 MED ORDER — POTASSIUM CHLORIDE 10 MEQ/100ML IV SOLN
10.0000 meq | INTRAVENOUS | Status: AC
  Administered 2013-09-23 – 2013-09-24 (×6): 10 meq via INTRAVENOUS
  Filled 2013-09-23 (×5): qty 100

## 2013-09-23 MED ORDER — CLONIDINE HCL 0.3 MG PO TABS
0.3000 mg | ORAL_TABLET | Freq: Two times a day (BID) | ORAL | Status: DC
  Administered 2013-09-23 – 2013-09-29 (×12): 0.3 mg via ORAL
  Filled 2013-09-23 (×15): qty 1

## 2013-09-23 MED ORDER — IPRATROPIUM-ALBUTEROL 0.5-2.5 (3) MG/3ML IN SOLN
3.0000 mL | RESPIRATORY_TRACT | Status: DC
  Administered 2013-09-23 (×2): 3 mL via RESPIRATORY_TRACT
  Filled 2013-09-23 (×2): qty 3

## 2013-09-23 MED ORDER — IOHEXOL 300 MG/ML  SOLN
80.0000 mL | Freq: Once | INTRAMUSCULAR | Status: AC | PRN
  Administered 2013-09-23: 80 mL via INTRAVENOUS

## 2013-09-23 MED ORDER — DILTIAZEM HCL 25 MG/5ML IV SOLN
2.5000 mg | Freq: Once | INTRAVENOUS | Status: AC
  Administered 2013-09-23: 2.5 mg via INTRAVENOUS
  Filled 2013-09-23: qty 5

## 2013-09-23 MED ORDER — DILTIAZEM LOAD VIA INFUSION
5.0000 mg | Freq: Once | INTRAVENOUS | Status: AC
  Administered 2013-09-23: 5 mg via INTRAVENOUS
  Filled 2013-09-23: qty 5

## 2013-09-23 MED ORDER — INSULIN ASPART 100 UNIT/ML ~~LOC~~ SOLN
0.0000 [IU] | SUBCUTANEOUS | Status: DC
  Administered 2013-09-23: 7 [IU] via SUBCUTANEOUS
  Administered 2013-09-24: 3 [IU] via SUBCUTANEOUS
  Administered 2013-09-24 (×2): 2 [IU] via SUBCUTANEOUS
  Administered 2013-09-24: 3 [IU] via SUBCUTANEOUS
  Administered 2013-09-24: 2 [IU] via SUBCUTANEOUS

## 2013-09-23 MED ORDER — ACETAMINOPHEN 650 MG RE SUPP: 650.0000 mg | Freq: Four times a day (QID) | RECTAL | Status: DC | PRN

## 2013-09-23 MED ORDER — INSULIN ASPART 100 UNIT/ML ~~LOC~~ SOLN
25.0000 [IU] | Freq: Once | SUBCUTANEOUS | Status: AC
  Administered 2013-09-23: 25 [IU] via SUBCUTANEOUS

## 2013-09-23 MED ORDER — LORAZEPAM 0.5 MG PO TABS
0.5000 mg | ORAL_TABLET | Freq: Once | ORAL | Status: AC
  Administered 2013-09-23: 0.5 mg via ORAL

## 2013-09-23 MED ORDER — INSULIN ASPART 100 UNIT/ML ~~LOC~~ SOLN: 0.0000 [IU] | SUBCUTANEOUS | Status: DC

## 2013-09-23 MED ORDER — TRAZODONE HCL 50 MG PO TABS
50.0000 mg | ORAL_TABLET | Freq: Every evening | ORAL | Status: DC | PRN
  Administered 2013-09-24 – 2013-09-30 (×10): 50 mg via ORAL
  Filled 2013-09-23 (×19): qty 1

## 2013-09-23 MED ORDER — ALBUTEROL SULFATE (2.5 MG/3ML) 0.083% IN NEBU: 2.5000 mg | INHALATION_SOLUTION | RESPIRATORY_TRACT | Status: DC | PRN

## 2013-09-23 MED ORDER — PROMETHAZINE HCL 25 MG/ML IJ SOLN
12.5000 mg | Freq: Four times a day (QID) | INTRAMUSCULAR | Status: AC | PRN
  Administered 2013-09-23: 12.5 mg via INTRAVENOUS
  Administered 2013-09-24: 03:00:00 via INTRAVENOUS
  Filled 2013-09-23 (×2): qty 1

## 2013-09-23 MED ORDER — LORAZEPAM 2 MG/ML IJ SOLN
0.5000 mg | Freq: Four times a day (QID) | INTRAMUSCULAR | Status: DC | PRN
  Administered 2013-09-23 – 2013-09-30 (×4): 0.5 mg via INTRAVENOUS
  Filled 2013-09-23 (×5): qty 1

## 2013-09-23 MED ORDER — SIMVASTATIN 20 MG PO TABS
20.0000 mg | ORAL_TABLET | Freq: Every day | ORAL | Status: DC
  Administered 2013-09-23: 20 mg via ORAL
  Filled 2013-09-23 (×2): qty 1

## 2013-09-23 MED ORDER — ONDANSETRON HCL 4 MG/2ML IJ SOLN: 4.0000 mg | Freq: Four times a day (QID) | INTRAMUSCULAR | Status: DC | PRN

## 2013-09-23 MED ORDER — IPRATROPIUM BROMIDE 0.02 % IN SOLN
0.5000 mg | Freq: Four times a day (QID) | RESPIRATORY_TRACT | Status: DC
  Administered 2013-09-24 – 2013-09-25 (×4): 0.5 mg via RESPIRATORY_TRACT
  Filled 2013-09-23 (×7): qty 2.5

## 2013-09-23 MED ORDER — MORPHINE SULFATE 2 MG/ML IJ SOLN
1.0000 mg | INTRAMUSCULAR | Status: DC | PRN
  Administered 2013-09-24 – 2013-09-30 (×3): 1 mg via INTRAVENOUS
  Filled 2013-09-23 (×3): qty 1

## 2013-09-23 MED ORDER — ALBUTEROL SULFATE (2.5 MG/3ML) 0.083% IN NEBU: 2.5000 mg | INHALATION_SOLUTION | RESPIRATORY_TRACT | Status: DC

## 2013-09-23 MED ORDER — FELODIPINE ER 10 MG PO TB24
10.0000 mg | ORAL_TABLET | Freq: Every day | ORAL | Status: DC
  Administered 2013-09-23: 10 mg via ORAL
  Filled 2013-09-23 (×2): qty 1

## 2013-09-23 MED ORDER — HYDRALAZINE HCL 50 MG PO TABS
50.0000 mg | ORAL_TABLET | Freq: Four times a day (QID) | ORAL | Status: DC
  Administered 2013-09-23 – 2013-09-29 (×24): 50 mg via ORAL
  Filled 2013-09-23 (×28): qty 1

## 2013-09-23 MED ORDER — ALBUTEROL SULFATE (2.5 MG/3ML) 0.083% IN NEBU
2.5000 mg | INHALATION_SOLUTION | Freq: Four times a day (QID) | RESPIRATORY_TRACT | Status: DC
  Administered 2013-09-23 (×2): 2.5 mg via RESPIRATORY_TRACT
  Filled 2013-09-23 (×2): qty 3

## 2013-09-23 MED ORDER — VANCOMYCIN HCL 500 MG IV SOLR
500.0000 mg | Freq: Two times a day (BID) | INTRAVENOUS | Status: DC
  Administered 2013-09-23 – 2013-09-26 (×8): 500 mg via INTRAVENOUS
  Filled 2013-09-23 (×10): qty 500

## 2013-09-23 MED ORDER — ONDANSETRON HCL 4 MG PO TABS
4.0000 mg | ORAL_TABLET | Freq: Four times a day (QID) | ORAL | Status: DC | PRN
  Administered 2013-09-23: 4 mg via ORAL
  Filled 2013-09-23: qty 1

## 2013-09-23 MED ORDER — IPRATROPIUM BROMIDE 0.02 % IN SOLN
0.5000 mg | Freq: Four times a day (QID) | RESPIRATORY_TRACT | Status: DC
  Administered 2013-09-23 (×2): 0.5 mg via RESPIRATORY_TRACT
  Filled 2013-09-23 (×2): qty 2.5

## 2013-09-23 MED ORDER — IPRATROPIUM BROMIDE 0.02 % IN SOLN: 0.5000 mg | RESPIRATORY_TRACT | Status: DC

## 2013-09-23 MED ORDER — FUROSEMIDE 10 MG/ML IJ SOLN
40.0000 mg | Freq: Every day | INTRAMUSCULAR | Status: DC
  Administered 2013-09-23: 40 mg via INTRAVENOUS
  Filled 2013-09-23: qty 4

## 2013-09-23 MED ORDER — METOCLOPRAMIDE HCL 5 MG/ML IJ SOLN
5.0000 mg | Freq: Four times a day (QID) | INTRAMUSCULAR | Status: DC
  Administered 2013-09-23 – 2013-10-01 (×29): 5 mg via INTRAVENOUS
  Filled 2013-09-23: qty 1
  Filled 2013-09-23: qty 2
  Filled 2013-09-23 (×29): qty 1
  Filled 2013-09-23: qty 2
  Filled 2013-09-23: qty 1
  Filled 2013-09-23 (×2): qty 2
  Filled 2013-09-23: qty 1

## 2013-09-23 MED ORDER — METHYLPREDNISOLONE SODIUM SUCC 125 MG IJ SOLR
60.0000 mg | Freq: Two times a day (BID) | INTRAMUSCULAR | Status: DC
  Administered 2013-09-24: 60 mg via INTRAVENOUS
  Filled 2013-09-23 (×2): qty 0.96

## 2013-09-23 MED ORDER — DEXTROSE 5 % IV SOLN
1.0000 g | Freq: Three times a day (TID) | INTRAVENOUS | Status: DC
  Administered 2013-09-23 – 2013-09-27 (×13): 1 g via INTRAVENOUS
  Filled 2013-09-23 (×14): qty 1

## 2013-09-23 MED ORDER — POTASSIUM CHLORIDE CRYS ER 20 MEQ PO TBCR
20.0000 meq | EXTENDED_RELEASE_TABLET | Freq: Every day | ORAL | Status: DC
  Administered 2013-09-23 – 2013-10-01 (×9): 20 meq via ORAL
  Filled 2013-09-23 (×9): qty 1

## 2013-09-23 MED ORDER — INSULIN ASPART 100 UNIT/ML ~~LOC~~ SOLN
0.0000 [IU] | Freq: Three times a day (TID) | SUBCUTANEOUS | Status: DC
  Administered 2013-09-23: 5 [IU] via SUBCUTANEOUS

## 2013-09-23 MED ORDER — METHYLPREDNISOLONE SODIUM SUCC 125 MG IJ SOLR
60.0000 mg | Freq: Every day | INTRAMUSCULAR | Status: DC
  Filled 2013-09-23: qty 0.96

## 2013-09-23 MED ORDER — LEVOFLOXACIN IN D5W 750 MG/150ML IV SOLN
750.0000 mg | INTRAVENOUS | Status: DC
  Administered 2013-09-23 – 2013-09-27 (×3): 750 mg via INTRAVENOUS
  Filled 2013-09-23 (×3): qty 150

## 2013-09-23 MED ORDER — METOPROLOL TARTRATE 1 MG/ML IV SOLN
5.0000 mg | Freq: Four times a day (QID) | INTRAVENOUS | Status: DC
  Administered 2013-09-23 – 2013-09-24 (×3): 5 mg via INTRAVENOUS
  Filled 2013-09-23 (×6): qty 5

## 2013-09-23 MED ORDER — ALBUTEROL SULFATE (2.5 MG/3ML) 0.083% IN NEBU
2.5000 mg | INHALATION_SOLUTION | RESPIRATORY_TRACT | Status: DC | PRN
Start: 2013-09-23 — End: 2013-09-23

## 2013-09-23 MED ORDER — LISINOPRIL 20 MG PO TABS
20.0000 mg | ORAL_TABLET | Freq: Every day | ORAL | Status: DC
  Administered 2013-09-23 – 2013-09-29 (×7): 20 mg via ORAL
  Filled 2013-09-23 (×7): qty 1

## 2013-09-23 MED ORDER — METHYLPREDNISOLONE SODIUM SUCC 125 MG IJ SOLR
60.0000 mg | Freq: Three times a day (TID) | INTRAMUSCULAR | Status: DC
  Administered 2013-09-23 (×2): 60 mg via INTRAVENOUS
  Filled 2013-09-23 (×4): qty 0.96

## 2013-09-23 MED ORDER — LORAZEPAM 0.5 MG PO TABS
0.5000 mg | ORAL_TABLET | Freq: Three times a day (TID) | ORAL | Status: DC
  Administered 2013-09-23 (×2): 0.5 mg via ORAL
  Filled 2013-09-23 (×4): qty 1

## 2013-09-23 MED ORDER — METOCLOPRAMIDE HCL 5 MG/ML IJ SOLN
10.0000 mg | Freq: Three times a day (TID) | INTRAMUSCULAR | Status: DC
Start: 2013-09-23 — End: 2013-09-23

## 2013-09-23 MED ORDER — DULOXETINE HCL 60 MG PO CPEP
60.0000 mg | ORAL_CAPSULE | Freq: Every day | ORAL | Status: DC
  Administered 2013-09-23 – 2013-10-01 (×9): 60 mg via ORAL
  Filled 2013-09-23 (×9): qty 1

## 2013-09-23 MED ORDER — DILTIAZEM HCL 100 MG IV SOLR
5.0000 mg/h | INTRAVENOUS | Status: DC
  Administered 2013-09-23: 10 mg/h via INTRAVENOUS
  Administered 2013-09-24: 15 mg/h via INTRAVENOUS
  Administered 2013-09-24: 10 mg/h via INTRAVENOUS
  Filled 2013-09-23: qty 100

## 2013-09-23 MED ORDER — HEPARIN (PORCINE) IN NACL 100-0.45 UNIT/ML-% IJ SOLN
1000.0000 [IU]/h | INTRAMUSCULAR | Status: DC
  Administered 2013-09-23: 1000 [IU]/h via INTRAVENOUS
  Filled 2013-09-23: qty 250

## 2013-09-23 MED ORDER — LORAZEPAM 2 MG/ML IJ SOLN: 1.0000 mg | Freq: Four times a day (QID) | INTRAMUSCULAR | Status: DC | PRN

## 2013-09-23 MED ORDER — ALUM & MAG HYDROXIDE-SIMETH 200-200-20 MG/5ML PO SUSP: 30.0000 mL | Freq: Four times a day (QID) | ORAL | Status: DC | PRN

## 2013-09-23 NOTE — Progress Notes (Signed)
Chaplain introduced spiritual care and provided brief support with pt's son while rounding in ICU.   Belva CromeStalnaker, Glendi Mohiuddin Wayne MDiv

## 2013-09-23 NOTE — Progress Notes (Signed)
PATIENT DETAILS Name: Glenda Underwood Age: 80 y.o. Sex: female Date of Birth: 11-14-1932 Admit Date: 09/22/2013 Admitting Physician Edsel Petrin, DO ZOX:[id number],XBJYN Homero Fellers, MD  Subjective: Had a episode of resp distress this am-given Nebs, Lasix -much better. Admitted with 3-4 day hx of SOB with cough. Unfortunately continues to smoke. Very hypoxic in the ED-required BiPAP-now on Nasal Cannula.  Assessment/Plan: Principal Problem:   Acute Hypoxic respiratory failure -multifactorial-combination of HCAP, Acute Diastolic CHF and COPD exac -treat the above, titrate off O2 as tolerated -c/w close monitoring in SDU-if deteriorates will consult PCCM  Active Problems: HCAP -repeat CXR done on 2/23 am shows Bronchopneumonia, given recent hospitalization will treat as HCAP -restart Vanco and Azactam today, will add Levaquin for atypical coverage  -blood cultures -remains afebrile, mild leukocytosis likely from steroids and is non toxic looking.  Acute Diastolic CHF -given acute resp distress this am-will increase Lasix to BID -monitor daily weights, I &O's -Hold Beta blockers given bronchospasm and recurrent acute resp distress  COPD with acute Exacerbation -never formally diagnosed with COPD-but long time smoker-will need outpatient PFT's -increase Solumedrol to 60 TID, change nebs to Q4 hrs for now, antibiotics as above  Hx of CAD -hx of PCI in 2013 -c/w ASA, Statins, hold metoprolol given bronchospasm -initial troponins neg-no chest pain-SOB secondary to above noted issues.  HTN -controlled with Clonidine, Felodipine, Lasix, and Lisinopril -monitor BP and adjust medications accordingly  Dyslipidemia -c/w statins  Hx of Type 2 DM -last A1C-7.2 -appears to be not on any medications as outpatient -place on SSI while inpatient-on steroids -suspect will benefit from being on metformin on discharge  Tobacco Abuse -counseled extensively -unfortunately was  smoking till a few days back prior to this admit -does not think she needs transdermal nicotine  Disposition: Remain inpatient  DVT Prophylaxis: Prophylactic Lovenox   Code Status: Full code   Family Communication None at bedside  Procedures:  None  CONSULTS:  None  Time spent 40 minutes-which includes 50% of the time with face-to-face with patient/ family and coordinating care related to the above assessment and plan.  MEDICATIONS: Scheduled Meds: . albuterol  2.5 mg Nebulization Q4H  . albuterol  5 mg Nebulization Once  . aspirin EC  81 mg Oral Daily  . aztreonam  1 g Intravenous 3 times per day  . cholecalciferol  1,000 Units Oral Daily  . cloNIDine  0.3 mg Oral BID  . DULoxetine  60 mg Oral Daily  . enoxaparin (LOVENOX) injection  40 mg Subcutaneous Q24H  . felodipine  10 mg Oral Daily  . furosemide  40 mg Intravenous Q12H  . hydrALAZINE  50 mg Oral QID  . ipratropium  0.5 mg Nebulization Once  . ipratropium  0.5 mg Nebulization Q4H  . levofloxacin (LEVAQUIN) IV  750 mg Intravenous Q48H  . lisinopril  20 mg Oral Daily  . LORazepam  0.5 mg Oral TID  . Memantine HCl ER  28 mg Oral Daily  . methylPREDNISolone (SOLU-MEDROL) injection  60 mg Intravenous 3 times per day  . potassium chloride  20 mEq Oral Daily  . senna  1 tablet Oral BID  . simvastatin  20 mg Oral q1800  . sodium chloride  3 mL Intravenous Q12H  . traZODone  50 mg Oral QHS,MR X 1  . vancomycin  500 mg Intravenous Q12H   Continuous Infusions: . sodium chloride Stopped (09/22/13 2339)   PRN Meds:.acetaminophen, albuterol, albuterol, alum &  mag hydroxide-simeth, ondansetron  Antibiotics: Anti-infectives   Start     Dose/Rate Route Frequency Ordered Stop   09/23/13 1000  vancomycin (VANCOCIN) 500 mg in sodium chloride 0.9 % 100 mL IVPB     500 mg 100 mL/hr over 60 Minutes Intravenous Every 12 hours 09/23/13 0753     09/23/13 0900  levofloxacin (LEVAQUIN) IVPB 750 mg     750 mg 100 mL/hr over  90 Minutes Intravenous Every 48 hours 09/23/13 0735     09/23/13 0900  aztreonam (AZACTAM) 1 g in dextrose 5 % 50 mL IVPB     1 g 100 mL/hr over 30 Minutes Intravenous 3 times per day 09/23/13 0753     09/22/13 2130  aztreonam (AZACTAM) 2 g in dextrose 5 % 50 mL IVPB     2 g 100 mL/hr over 30 Minutes Intravenous  Once 09/22/13 2128 09/22/13 2333   09/22/13 2130  vancomycin (VANCOCIN) IVPB 1000 mg/200 mL premix     1,000 mg 200 mL/hr over 60 Minutes Intravenous  Once 09/22/13 2128 09/23/13 0104       PHYSICAL EXAM: Vital signs in last 24 hours: Filed Vitals:   09/23/13 0257 09/23/13 0400 09/23/13 0732 09/23/13 0800  BP:  158/66    Pulse:  82 112   Temp:  98.2 F (36.8 C)  97.6 F (36.4 C)  TempSrc:  Oral  Axillary  Resp:  19 19   Height:      Weight:  68.9 kg (151 lb 14.4 oz)    SpO2: 96% 99% 99%     Weight change:  Filed Weights   09/23/13 0130 09/23/13 0400  Weight: 70.2 kg (154 lb 12.2 oz) 68.9 kg (151 lb 14.4 oz)   Body mass index is 27.78 kg/(m^2).   Gen Exam: Awake and alert with clear speech.  Mildly tachypneic Neck: Supple, No JVD.  Chest: Moving air-prolonged exp, coarse rhonchi and rales throughout both lung fields CVS: S1 S2 Regular, no murmurs.  Abdomen: soft, BS +, non tender, non distended.  Extremities: no edema, lower extremities warm to touch. Neurologic: Non Focal.   Skin: No Rash.   Wounds: N/A.   Intake/Output from previous day:  Intake/Output Summary (Last 24 hours) at 09/23/13 0903 Last data filed at 09/23/13 0732  Gross per 24 hour  Intake     13 ml  Output   1850 ml  Net  -1837 ml     LAB RESULTS: CBC  Recent Labs Lab 09/22/13 1850 09/23/13 0340  WBC 9.7 14.5*  HGB 12.3 13.2  HCT 37.2 40.0  PLT 269 231  MCV 83.8 84.6  MCH 27.7 27.9  MCHC 33.1 33.0  RDW 13.9 13.8    Chemistries   Recent Labs Lab 09/22/13 1850 09/23/13 0340  NA 139  --   K 3.8  --   CL 102  --   CO2 21  --   GLUCOSE 137*  --   BUN 12  --     CREATININE 0.81 1.01  CALCIUM 9.0  --     CBG: No results found for this basename: GLUCAP,  in the last 168 hours  GFR Estimated Creatinine Clearance: 41.1 ml/min (by C-G formula based on Cr of 1.01).  Coagulation profile No results found for this basename: INR, PROTIME,  in the last 168 hours  Cardiac Enzymes  Recent Labs Lab 09/22/13 2015  TROPONINI <0.30    No components found with this basename: POCBNP,  No results found for  this basename: DDIMER,  in the last 72 hours No results found for this basename: HGBA1C,  in the last 72 hours No results found for this basename: CHOL, HDL, LDLCALC, TRIG, CHOLHDL, LDLDIRECT,  in the last 72 hours No results found for this basename: TSH, T4TOTAL, FREET3, T3FREE, THYROIDAB,  in the last 72 hours No results found for this basename: VITAMINB12, FOLATE, FERRITIN, TIBC, IRON, RETICCTPCT,  in the last 72 hours No results found for this basename: LIPASE, AMYLASE,  in the last 72 hours  Urine Studies No results found for this basename: UACOL, UAPR, USPG, UPH, UTP, UGL, UKET, UBIL, UHGB, UNIT, UROB, ULEU, UEPI, UWBC, URBC, UBAC, CAST, CRYS, UCOM, BILUA,  in the last 72 hours  MICROBIOLOGY: Recent Results (from the past 240 hour(s))  MRSA PCR SCREENING     Status: None   Collection Time    09/23/13  1:28 AM      Result Value Ref Range Status   MRSA by PCR NEGATIVE  NEGATIVE Final   Comment:            The GeneXpert MRSA Assay (FDA     approved for NASAL specimens     only), is one component of a     comprehensive MRSA colonization     surveillance program. It is not     intended to diagnose MRSA     infection nor to guide or     monitor treatment for     MRSA infections.    RADIOLOGY STUDIES/RESULTS: Dg Chest 2 View  09/22/2013   CLINICAL DATA:  Shortness of breath, pneumonia  EXAM: CHEST  2 VIEW  COMPARISON:  DG CHEST 1V PORT dated 08/16/2013  FINDINGS: There is no focal parenchymal opacity, pleural effusion, or pneumothorax.  Stable cardiomegaly.  There is mild thoracic spine spondylosis.  IMPRESSION: No active cardiopulmonary disease.   Electronically Signed   By: Elige KoHetal  Patel   On: 09/22/2013 19:04   Dg Chest Port 1 View  09/23/2013   CLINICAL DATA:  Respiratory failure.  EXAM: PORTABLE CHEST - 1 VIEW  COMPARISON:  Chest x-ray 09/22/2013.  FINDINGS: Mild diffuse peribronchial cuffing throughout the lungs bilaterally. Patchy ill-defined airspace disease throughout the right mid to lower lung. No definite pleural effusions. No cephalization of the pulmonary vasculature. Heart size is mildly enlarged. The patient is rotated to the left on today's exam, resulting in distortion of the mediastinal contours and reduced diagnostic sensitivity and specificity for mediastinal pathology.  IMPRESSION: 1. The appearance the chest suggests bronchitis with multilobar bronchopneumonia in the right lung. 2. Mild cardiomegaly.   Electronically Signed   By: Trudie Reedaniel  Entrikin M.D.   On: 09/23/2013 06:49    Jeoffrey MassedGHIMIRE,Linard Daft, MD  [facility] Pager:[phone]  If 7PM-7AM, please contact night-coverage [email or web address] Password [email or web address] 09/23/2013, 9:03 AM   LOS: 1 day

## 2013-09-23 NOTE — Progress Notes (Signed)
ANTICOAGULATION CONSULT NOTE - Initial Consult  Pharmacy Consult for IV Heparin Indication: atrial fibrillation  Allergies  Allergen Reactions  . Penicillins Anaphylaxis    Patient Measurements: Height: 5\' 2"  (157.5 cm) Weight: 151 lb 14.4 oz (68.9 kg) IBW/kg (Calculated) : 50.1 Heparin Dosing Weight: 64 kg  Vital Signs: Temp: 99 F (37.2 C) (03/23 2000) Temp src: Axillary (03/23 2000) BP: 158/45 mmHg (03/23 1702) Pulse Rate: 101 (03/23 1900)  Labs:  Recent Labs  09/22/13 1850 09/22/13 2015 09/23/13 0340 09/23/13 1901  HGB 12.3  --  13.2  --   HCT 37.2  --  40.0  --   PLT 269  --  231  --   CREATININE 0.81  --  1.01 1.00  TROPONINI  --  <0.30  --  0.70*    Estimated Creatinine Clearance: 41.5 ml/min (by C-G formula based on Cr of 1).   Medical History: Past Medical History  Diagnosis Date  . CORONARY ARTERY DISEASE 07/20/2010    with stent  . DEPRESSION 07/20/2010  . DM (diabetes mellitus) type II controlled, neurological manifestation 07/20/2010  . GERD 07/20/2010  . HYPERLIPIDEMIA 07/20/2010  . HYPERTENSION 07/20/2010  . LOW BACK PAIN 07/20/2010  . OSTEOARTHRITIS 07/20/2010  . URINARY INCONTINENCE 07/20/2010  . TIA (transient ischemic attack)   . CAD (coronary artery disease)     Stenting 2008 (no details)  . Dementia   . Hypokalemia   . Spinal stenosis   . Tobacco abuse   . Varicose vein of leg      Assessment: [age] yoF admitted with HCAP, acute diastolic CHF, AECOPD now in afib with RVR and elevated troponin.  MD notes that elevated troponin is likely demand ischemia due to PNA, CHF decompensation, and now afib.  Starting IV heparin tonight per pharmacy protocol with plans to d/c when afib rate controlled and back in NSR.  Patient received Lovenox 40 mg dose for VTE prophylaxis this AM at 0927.  Lovenox d/c'd.  Will not bolus heparin due to recent Lovenox administration.  Hgb, Platelets WNL  SCr 1.00, CrCl~41 ml/min  Goal of Therapy:  Heparin level  0.3-0.7 units/ml Monitor platelets by anticoagulation protocol: Yes   Plan:  1.  Heparin 3000 unit bolus x 1 (conservative bolus d/t advanced age and prophylactic lovenox dose given 12 hours prior) then start heparin infusion at 1000 units/hr. 2.  Check heparin level 8 hours after start of infusion. 3.  Daily HL and CBC while on heparin.    Clance Bollunyon, Kendall Justo 09/23/2013,9:49 PM

## 2013-09-23 NOTE — Progress Notes (Signed)
ANTIBIOTIC CONSULT NOTE - INITIAL  Pharmacy Consult for vancomycin/aztreonam Indication: pneumonia  Allergies  Allergen Reactions  . Penicillins Anaphylaxis    Patient Measurements: Height: 5\' 2"  (157.5 cm) Weight: 151 lb 14.4 oz (68.9 kg) IBW/kg (Calculated) : 50.1 Adjusted Body Weight:   Vital Signs: Temp: 98.2 F (36.8 C) (03/23 0400) Temp src: Oral (03/23 0400) BP: 158/66 mmHg (03/23 0400) Pulse Rate: 112 (03/23 0732) Intake/Output from previous day: 03/22 0701 - 03/23 0700 In: 3 [I.V.:3] Out: 1650 [Urine:1650] Intake/Output from this shift: Total I/O In: 10 [I.V.:10] Out: 200 [Urine:200]  Labs:  Recent Labs  09/22/13 1850 09/23/13 0340  WBC 9.7 14.5*  HGB 12.3 13.2  PLT 269 231  CREATININE 0.81 1.01   Estimated Creatinine Clearance: 41.1 ml/min (by C-G formula based on Cr of 1.01). No results found for this basename: VANCOTROUGH, Leodis BinetVANCOPEAK, VANCORANDOM, GENTTROUGH, GENTPEAK, GENTRANDOM, TOBRATROUGH, TOBRAPEAK, TOBRARND, AMIKACINPEAK, AMIKACINTROU, AMIKACIN,  in the last 72 hours   Microbiology: Recent Results (from the past 720 hour(s))  MRSA PCR SCREENING     Status: None   Collection Time    09/23/13  1:28 AM      Result Value Ref Range Status   MRSA by PCR NEGATIVE  NEGATIVE Final   Comment:            The GeneXpert MRSA Assay (FDA     approved for NASAL specimens     only), is one component of a     comprehensive MRSA colonization     surveillance program. It is not     intended to diagnose MRSA     infection nor to guide or     monitor treatment for     MRSA infections.    Medical History: Past Medical History  Diagnosis Date  . CORONARY ARTERY DISEASE 07/20/2010    with stent  . DEPRESSION 07/20/2010  . DM (diabetes mellitus) type II controlled, neurological manifestation 07/20/2010  . GERD 07/20/2010  . HYPERLIPIDEMIA 07/20/2010  . HYPERTENSION 07/20/2010  . LOW BACK PAIN 07/20/2010  . OSTEOARTHRITIS 07/20/2010  . URINARY INCONTINENCE  07/20/2010  . TIA (transient ischemic attack)   . CAD (coronary artery disease)     Stenting 2008 (no details)  . Dementia   . Hypokalemia   . Spinal stenosis   . Tobacco abuse   . Varicose vein of leg    Assessment: [age] YOF presenting with dyspnea from ALF.  Given one-time doses of vancomycin and aztreonam in ED but not resumed last night.  Rounding physician states he wishes to continue vancomycin, aztreonam, and levofloxacin.   3/23 >>vancomycin  >> 3/23 >>aztreonam  >>   3/23 >> levofloxacin >>  Tmax: 100.9 WBCs: 14.5 Renal:SCr =1.01 for est CrCl = 5841ml/min (C-G) and 5051ml/min (N)  No culture data  Drug level / dose changes info:  Goal of Therapy:  Vancomycin trough level 15-20 mcg/ml  Plan:   Vancomycin 500mg  IV q12h  Follow renal function and check vancomycin trough as indicated  Aztreonam 1gm IV q8h  Continue levofloxacin 750mg  IV q48h as per current CrCl  Juliette Alcideustin Zeigler, PharmD, BCPS.   Pager: [phone]  09/23/2013,7:43 AM

## 2013-09-23 NOTE — Progress Notes (Addendum)
Jimmye NormanKaren Kirby_ Graham, NP at bedside to assess patient after being paged about rhythm changes prior to beginning of shift. EKG reading of AFIB with RVR.pt has no h/o AFIB. HR sustained between 110-120. Pt with N/V and diaphoretic. Denies pain in chest or back, however pt has tachypnea in the 30's and labored.  Awaiting lab work and any orders that need implemented.

## 2013-09-23 NOTE — Progress Notes (Signed)
Subjective/HPI:   [name] is an elderly WF with multiple past medical comorbidities admitted yesterday with PNA, acute resp failure, CHF decompensation, and COPD. She is a smoker. Has a hx of dementia, GERD, and CAD.  Tonight, RN paged this NP secondary to pt being nauseated with vomiting x 4, diaphoretic, tachypneic, and tachycardic. EKG obtained which showed Afib with RVR. Pt denies a hx of this but is on Cardizem and is not a good historian. NP immediately to bedside.  Pt says she is a nervous person and it is not unusual for her to have frequent n/v at home. She says she is SOB but better than yesterday. She denies CP, abd pain, back pain, jaw pain or arm pain.  "Boyfriend" at bedside says pt has a "terrible memory". Pt says her daughter is a Charity fundraiser and we have permission to speak with her.   Objective: Vital signs in last 24 hours: Temp:  [97.6 F (36.4 C)-98.8 F (37.1 C)] 98.3 F (36.8 C) (03/23 1600) Pulse Rate:  [80-120] 101 (03/23 1900) Resp:  [16-44] 25 (03/23 1900) BP: (132-207)/(45-94) 158/45 mmHg (03/23 1702) SpO2:  [92 %-100 %] 99 % (03/23 1900) FiO2 (%):  [50 %-55 %] 50 % (03/22 2250) Weight:  [68.9 kg (151 lb 14.4 oz)-70.2 kg (154 lb 12.2 oz)] 68.9 kg (151 lb 14.4 oz) (03/23 0400) Weight change:  Last BM Date: 09/22/13  Intake/Output from previous day: 03/22 0701 - 03/23 0700 In: 60 [I.V.:60] Out: 1650 [Urine:1650] Intake/Output this shift:   PE: Poor appearing elderly WF who appears anxious but not toxic. She is alert and oriented to hospital, friend, and why she is here. Her skin is somewhat pale,dry and no cyanosis noted. VS reviewed-BP in the 150s. HR 80-110, Afib and in and out of sinus tach. RR 24 with slightly increased effort but no use of abd muscles noted. Lungs with some minor wheezing but good air exchange. No crackles noted. Card: irreg irreg. Abd with positive BS x4, soft, NT, ND. MOE x 4. LEs with trace edema.   Lab Results:  Recent Labs  09/22/13 1850  09/23/13 0340  WBC 9.7 14.5*  HGB 12.3 13.2  HCT 37.2 40.0  PLT 269 231   BMET  Recent Labs  09/22/13 1850 09/23/13 0340  NA 139  --   K 3.8  --   CL 102  --   CO2 21  --   GLUCOSE 137*  --   BUN 12  --   CREATININE 0.81 1.01  CALCIUM 9.0  --     Studies/Results: Dg Chest 2 View  09/22/2013   CLINICAL DATA:  Shortness of breath, pneumonia  EXAM: CHEST  2 VIEW  COMPARISON:  DG CHEST 1V PORT dated 08/16/2013  FINDINGS: There is no focal parenchymal opacity, pleural effusion, or pneumothorax. Stable cardiomegaly.  There is mild thoracic spine spondylosis.  IMPRESSION: No active cardiopulmonary disease.   Electronically Signed   By: Elige Ko   On: 09/22/2013 19:04   Ct Chest W Contrast  09/23/2013   CLINICAL DATA:  Shortness of breath and cough.  EXAM: CT CHEST WITH CONTRAST  TECHNIQUE: Multidetector CT imaging of the chest was performed during intravenous contrast administration.  CONTRAST:  80mL OMNIPAQUE IOHEXOL 300 MG/ML  SOLN  COMPARISON:  Insert priors  FINDINGS: No pathologically enlarged mediastinal, hilar or axillary lymph nodes. Atherosclerotic calcification of the arterial vasculature including extensive three-vessel involvement of the coronary arteries. Heart is enlarged. No pericardial effusion.  Trace  right pleural effusion. Image quality is degraded by respiratory motion. Patchy areas of ground-glass in the apices of both upper lobes, right greater than left. Patchy ground-glass airspace disease in the right lower lobe with consolidation dependently. Minimal volume loss in the left lower lobe with probable mild ground-glass in the left lower lobe (image 41). Airway is unremarkable.  Incidental imaging of the upper abdomen shows the visualized portions of the liver, adrenal glands, kidneys, spleen, pancreas, stomach and bowel to be grossly unremarkable. No upper abdominal adenopathy. No worrisome lytic or sclerotic lesions. Degenerative changes are seen in the spine.   IMPRESSION: 1. Patchy areas of ground-glass in the upper lobes and lower lobes, suspicious for multilobar pneumonia. Image quality is degraded by respiratory motion. 2. Trace right pleural fluid. 3. Extensive 3 vessel coronary artery calcification.   Electronically Signed   By: Leanna BattlesMelinda  Blietz M.D.   On: 09/23/2013 17:08   Dg Chest Port 1 View  09/23/2013   CLINICAL DATA:  Respiratory failure.  EXAM: PORTABLE CHEST - 1 VIEW  COMPARISON:  Chest x-ray 09/22/2013.  FINDINGS: Mild diffuse peribronchial cuffing throughout the lungs bilaterally. Patchy ill-defined airspace disease throughout the right mid to lower lung. No definite pleural effusions. No cephalization of the pulmonary vasculature. Heart size is mildly enlarged. The patient is rotated to the left on today's exam, resulting in distortion of the mediastinal contours and reduced diagnostic sensitivity and specificity for mediastinal pathology.  IMPRESSION: 1. The appearance the chest suggests bronchitis with multilobar bronchopneumonia in the right lung. 2. Mild cardiomegaly.   Electronically Signed   By: Trudie Reedaniel  Entrikin M.D.   On: 09/23/2013 06:49    Medications: reviewed  Assessment/Plan: 1. Afib with RVR-? New. HR has come down and she is in and out of Afib now, so will try Cardizem           2.5mg  IV x 1. Avoid BB in light of COPD and resp failure. No hx Afib per daughter. 2. Respiratory failure secondary to COPD/PNA/CHF decomp-on abx. Continue supportive treatment.        Watch resp effort carefully. I don't feel an ABG is needed at present. She has not desatted at all.       CT  chest today showed PNA, no PE. Low threshold for calling pulmonary consult.  3. N/v with hx GERD-will cycle troponins given HR changes. Add Protonix. Abd exam benign. 4. Hypokalemia-replete, check Mg.  5. CAD-hx. Watch troponins. Spoke to daughter, pt has hx stent placement.  6. DM II-cont CBG, SSI.    Rest of plan per orders and we will watch her carefully  tonight. This NP spoke with pt's daughter, Glenda Underwood, at bedside. Updated on treatment plan and issues.    LOS: 1 day   KIRBY-GRAHAM, Clydie BraunKAREN 09/23/2013, 8:01 PM

## 2013-09-23 NOTE — ED Notes (Signed)
In and out attempted. Unsuccessful. suspected uterine prolapse. Other staff notified of help needed.

## 2013-09-23 NOTE — Progress Notes (Addendum)
Update: Troponin came back .70. Pt also back in Afib with RVR and HR in the 150s. This NP called cardio fellow, Elly Modenaalton McClean MD, to discuss case. Dr. Jearld PiesMcClean had reviewed chart, tests, etc and agreed that elevated troponin is likely demand ischemia due to PNA, CHF decompensation and now Afib. Dr. Jearld PiesMcClean agreed that Heparin gtt can be started tonight and d/c'd when Afib rate controlled or back in NSR, as he doubts this is ACS. Doubt pt is a candidate for long term anticoagulation given her Dementia. If needed, official cardio consult can be called in am. Will continue to cycle troponins and follow pt closely. Will get another EKG to assess changes if/when pt returns to sinus rhythm.  Jimmye NormanKaren Kirby-Graham, NP [facility] Update-Cardizem gtt was started for Afib with RVR and HR in the 150-160 range. Cardizem maxed out at 20mg /hr and HR still 120-130s. Concerned over starting amiodarone given pulmonary issues. Called cardio fellow, Dr. Jearld PiesMcClean, who recommends using Metoprolol q6hrs. Pt had more n/v. Abd film neg for acute issues. Made pt NPO except ice chips. Holding po meds tonight since pt on Cardizem and now Metoprolol and BP is controlled. Can restart po meds in am if/when n/v subsides. Pt started on Reglan and protonix. Changed pt to ICU status for closer nsg care.  KJKG, NP

## 2013-09-23 NOTE — H&P (Signed)
Hospital Admission Note Date: 09/23/2013  Patient name: Glenda Underwood Medical record number: [id number] Date of birth: 09/29/1932 Age: 80 y.o. Gender: female PCP: Rogelia BogaKWIATKOWSKI,PETER FRANK, MD  Chief Complaint: Dyspnea  History of Present Illness: The patient is a 80 y.o. year-old female with history of CAD, T2DM, chronic pain from arthritis and spinal stenosis, possible COPD (unconfirmed), Mild chronic Diastolic CHF, TIA and dementia who was brought to ED ia EMS from her ALF with acute onset respiratory failure. Per ED notes patient nearly required intubation but responded rapidly to interventions in ED. At teh time of my evaluation patient was on BiPap and confused and unable to contribute to history. This admission is very similar to her prior admission on 08/16/2013. Her lung disease and underlying etiology of her respiratory failure is largely undetermined.   Meds: Current Facility-Administered Medications  Medication Dose Route Frequency Provider Last Rate Last Dose  . 0.9 %  sodium chloride infusion   Intravenous Continuous Suzi RootsKevin E Steinl, MD      . acetaminophen (TYLENOL) tablet 650 mg  650 mg Oral Q4H PRN Edsel PetrinElizabeth L Ilamae Geng, DO      . albuterol (PROVENTIL) (2.5 MG/3ML) 0.083% nebulizer solution 2.5 mg  2.5 mg Nebulization Q4H PRN Suzi RootsKevin E Steinl, MD      . albuterol (PROVENTIL) (2.5 MG/3ML) 0.083% nebulizer solution 2.5 mg  2.5 mg Nebulization Q6H Edsel PetrinElizabeth L Danon Lograsso, DO      . albuterol (PROVENTIL) (2.5 MG/3ML) 0.083% nebulizer solution 2.5 mg  2.5 mg Nebulization Q2H PRN Edsel PetrinElizabeth L Sharrod Achille, DO      . albuterol (PROVENTIL) (2.5 MG/3ML) 0.083% nebulizer solution 5 mg  5 mg Nebulization Once Suzi RootsKevin E Steinl, MD      . alum & mag hydroxide-simeth (MAALOX/MYLANTA) 200-200-20 MG/5ML suspension 30 mL  30 mL Oral Q6H PRN Edsel PetrinElizabeth L Whitney Bingaman, DO      . aspirin tablet 81 mg  81 mg Oral Daily Edsel PetrinElizabeth L Deyjah Kindel, DO      . cholecalciferol (VITAMIN D) tablet 1,000 Units  1,000 Units Oral Daily  Edsel PetrinElizabeth L Meegan Shanafelt, DO      . cloNIDine (CATAPRES) tablet 0.3 mg  0.3 mg Oral BID Edsel PetrinElizabeth L Dyamond Tolosa, DO      . DULoxetine (CYMBALTA) DR capsule 60 mg  60 mg Oral Daily Edsel PetrinElizabeth L Lyrah Bradt, DO      . enoxaparin (LOVENOX) injection 40 mg  40 mg Subcutaneous Q24H Edsel PetrinElizabeth L Ruthell Feigenbaum, DO      . felodipine (PLENDIL) 24 hr tablet 10 mg  10 mg Oral Daily Edsel PetrinElizabeth L Alakai Macbride, DO      . furosemide (LASIX) injection 40 mg  40 mg Intravenous Daily Edsel PetrinElizabeth L Hatice Bubel, DO      . hydrALAZINE (APRESOLINE) tablet 50 mg  50 mg Oral QID Edsel PetrinElizabeth L Kenon Delashmit, DO      . ipratropium (ATROVENT) nebulizer solution 0.5 mg  0.5 mg Nebulization Once Suzi RootsKevin E Steinl, MD      . ipratropium (ATROVENT) nebulizer solution 0.5 mg  0.5 mg Nebulization Q6H Edsel PetrinElizabeth L Everton Bertha, DO      . lisinopril (PRINIVIL,ZESTRIL) tablet 20 mg  20 mg Oral Daily Edsel PetrinElizabeth L Pete Merten, DO      . LORazepam (ATIVAN) tablet 0.5 mg  0.5 mg Oral TID Edsel PetrinElizabeth L Meggan Dhaliwal, DO      . Memantine HCl ER CP24 28 mg  28 mg Oral Daily Edsel PetrinElizabeth L Orlander Norwood, DO      . methylPREDNISolone sodium succinate (SOLU-MEDROL) 125 mg/2 mL injection 60 mg  60  mg Intravenous Q24H Edsel Petrin, DO      . metoprolol succinate (TOPROL-XL) 24 hr tablet 50 mg  50 mg Oral Daily Edsel Petrin, DO      . ondansetron [facility]) tablet 4 mg  4 mg Oral Q6H PRN Edsel Petrin, DO      . senna [facility]) tablet 8.6 mg  1 tablet Oral BID Edsel Petrin, DO      . simvastatin (ZOCOR) tablet 20 mg  20 mg Oral Daily Edsel Petrin, DO      . sodium chloride 0.9 % injection 3 mL  3 mL Intravenous Q12H Edsel Petrin, DO      . traZODone (DESYREL) tablet 50 mg  50 mg Oral QHS,MR X 1 Edsel Petrin, DO        Allergies: Allergies as of 09/22/2013 - Review Complete 09/22/2013  Allergen Reaction Noted  . Penicillins Anaphylaxis 07/20/2010   Past Medical History  Diagnosis Date  . CORONARY ARTERY DISEASE 07/20/2010    with stent  . DEPRESSION 07/20/2010   . DM (diabetes mellitus) type II controlled, neurological manifestation 07/20/2010  . GERD 07/20/2010  . HYPERLIPIDEMIA 07/20/2010  . HYPERTENSION 07/20/2010  . LOW BACK PAIN 07/20/2010  . OSTEOARTHRITIS 07/20/2010  . URINARY INCONTINENCE 07/20/2010  . TIA (transient ischemic attack)   . CAD (coronary artery disease)     Stenting 2008 (no details)  . Dementia   . Hypokalemia   . Spinal stenosis   . Tobacco abuse   . Varicose vein of leg    Past Surgical History  Procedure Laterality Date  . Abdominal hysterectomy    . Vitrectomy Left   . Total knee arthroplasty    . Coronary stent placement    . Varicose vein surgery     Family History  Problem Relation Age of Onset  . Hypertension Mother   . Diabetes Mother   . Breast cancer Sister    History   Social History  . Marital Status: Widowed    Spouse Name: N/A    Number of Children: 5  . Years of Education: N/A   Occupational History  . Not on file.   Social History Main Topics  . Smoking status: Current Every Day Smoker -- 0.50 packs/day    Types: Cigarettes  . Smokeless tobacco: Never Used     Comment: smokes 2-3 per day  . Alcohol Use: No  . Drug Use: No  . Sexual Activity: Not Currently   Other Topics Concern  . Not on file   Social History Narrative   She lives in [facility] and ambulates with walker.    She has two dogs.      Review of Systems: Review of systems not obtained due to patient factors.  Physical Exam: Blood pressure 174/75, pulse 88, temperature 98.5 F (36.9 C), temperature source Oral, resp. rate 23, height 5\' 2"  (1.575 m), weight 70.2 kg (154 lb 12.2 oz), SpO2 95.00%.  Elderly woman, appears acutely ill, on bipap, trying to speak but having great difficulty. She is able to tell me she is in the emergency room. Visibly uncomfortable with bipap mask. HEENT: poor dentition, neck supple LUNGS: scattered wheezing but moving air very well, crackles fixed in bilateral bases. CV: Tachycardic  no MRG Ext: +LE edema ankles Skin: mild dermatitis and dry skin Neuro: follows commands, nothing focal  Lab results: Basic Metabolic Panel:  Recent Labs  16/10/96 1850  NA 139  K 3.8  CL 102  CO2 21  GLUCOSE 137*  BUN 12  CREATININE 0.81  CALCIUM 9.0   Liver Function Tests:  Recent Labs  09/22/13 1850  AST 24  ALT 18  ALKPHOS 120*  BILITOT 0.5  PROT 7.1  ALBUMIN 3.5   CBC:  Recent Labs  09/22/13 1850  WBC 9.7  HGB 12.3  HCT 37.2  MCV 83.8  PLT 269   Cardiac Enzymes:  Recent Labs  09/22/13 2015  TROPONINI <0.30   BNP:  Recent Labs  09/22/13 1950  PROBNP 4360.0*  Urine Drug Screen: Drugs of Abuse     Component Value Date/Time   LABOPIA NONE DETECTED 06/06/2012 2224   COCAINSCRNUR NONE DETECTED 06/06/2012 2224   LABBENZ NONE DETECTED 06/06/2012 2224   AMPHETMU NONE DETECTED 06/06/2012 2224   THCU NONE DETECTED 06/06/2012 2224   LABBARB NONE DETECTED 06/06/2012 2224    Imaging results:  Dg Chest 2 View  09/22/2013   CLINICAL DATA:  Shortness of breath, pneumonia  EXAM: CHEST  2 VIEW  COMPARISON:  DG CHEST 1V PORT dated 08/16/2013  FINDINGS: There is no focal parenchymal opacity, pleural effusion, or pneumothorax. Stable cardiomegaly.  There is mild thoracic spine spondylosis.  IMPRESSION: No active cardiopulmonary disease.   Electronically Signed   By: Elige Ko   On: 09/22/2013 19:04    Other results: [id number] from prior  Assessment & Plan by Problem:  This is a 80 year old woman who has acute on chronic respiratory failure that has not been fully characterized. She has a long-term smoking history but has never been formally diagnosed with COPD. Her chest x-ray is remarkably clear does not suggest underlying pneumonia or COPD. She does not have an elevation in her white blood cell count and she is not febrile. She has not had a productive cough- so that this current time it is difficult for me to treat or diagnose a pneumonia. She does not  have any pulmonary edema evident by her portable chest x-ray however she does have an elevated BNP suggests this may be playing a role in her respiratory failure. She had quite a bit of wheezing on admission and seemed to respond best to be bronchodilators and steroids so it is reasonable to treat for a COPD exacerbation- she does not appear to have a home COPD regimen listed. Hospital problems as follows:  1. Acute respiratory failure likely secondary to transient pulmonary edema not seen on chest x-ray and probable COPD exacerbation.  Admit to step down unit  When necessary BiPAP, supplemental oxygen  Routine monitoring including ABG and interval chest x-ray  Diurese with IV Lasix  Start on COPD exacerbation regimen that will include prednisone 60 mg daily and scheduled bronchodilators  Would be reasonable to consider a CT scan of this lady's chest given this is her second admission with respiratory failure and a clear chest x-ray- will defer until morning team has a chance to evaluate her clinically and interval chest x-ray.  2. Hypertension, moderate to severe  Maintained home regimen, add a diuretic  Continue aspirin  3. Hyperlipidemia  Continue her statin  Otherwise continue her medications for her chronic disease management. She is high risk for delirium a stone her history of probable vascular dementia.   Dispo: Disposition is deferred at this time, awaiting improvement of current medical problems.   Code Status: Full Code by default, no available HCPOA   Signed: Amol Domanski 09/23/2013, 2:23 AM

## 2013-09-24 ENCOUNTER — Inpatient Hospital Stay (HOSPITAL_COMMUNITY): Payer: Medicare Other

## 2013-09-24 DIAGNOSIS — J9620 Acute and chronic respiratory failure, unspecified whether with hypoxia or hypercapnia: Secondary | ICD-10-CM

## 2013-09-24 DIAGNOSIS — J441 Chronic obstructive pulmonary disease with (acute) exacerbation: Secondary | ICD-10-CM

## 2013-09-24 DIAGNOSIS — J189 Pneumonia, unspecified organism: Secondary | ICD-10-CM

## 2013-09-24 DIAGNOSIS — I369 Nonrheumatic tricuspid valve disorder, unspecified: Secondary | ICD-10-CM

## 2013-09-24 DIAGNOSIS — I4891 Unspecified atrial fibrillation: Secondary | ICD-10-CM | POA: Diagnosis not present

## 2013-09-24 LAB — GLUCOSE, CAPILLARY
GLUCOSE-CAPILLARY: 178 mg/dL — AB (ref 70–99)
Glucose-Capillary: 177 mg/dL — ABNORMAL HIGH (ref 70–99)
Glucose-Capillary: 197 mg/dL — ABNORMAL HIGH (ref 70–99)
Glucose-Capillary: 197 mg/dL — ABNORMAL HIGH (ref 70–99)
Glucose-Capillary: 205 mg/dL — ABNORMAL HIGH (ref 70–99)
Glucose-Capillary: 217 mg/dL — ABNORMAL HIGH (ref 70–99)
Glucose-Capillary: 314 mg/dL — ABNORMAL HIGH (ref 70–99)

## 2013-09-24 LAB — CBC
HCT: 37.7 % (ref 36.0–46.0)
Hemoglobin: 12.9 g/dL (ref 12.0–15.0)
MCH: 27.8 pg (ref 26.0–34.0)
MCHC: 34.2 g/dL (ref 30.0–36.0)
MCV: 81.3 fL (ref 78.0–100.0)
PLATELETS: 307 10*3/uL (ref 150–400)
RBC: 4.64 MIL/uL (ref 3.87–5.11)
RDW: 13.8 % (ref 11.5–15.5)
WBC: 37.1 10*3/uL — ABNORMAL HIGH (ref 4.0–10.5)

## 2013-09-24 LAB — TROPONIN I
Troponin I: 0.85 ng/mL (ref <0.30)
Troponin I: 0.69 ng/mL (ref <0.30)

## 2013-09-24 LAB — BASIC METABOLIC PANEL
BUN: 23 mg/dL (ref 6–23)
CO2: 22 mEq/L (ref 19–32)
Calcium: 8.6 mg/dL (ref 8.4–10.5)
Chloride: 93 mEq/L — ABNORMAL LOW (ref 96–112)
Creatinine, Ser: 1 mg/dL (ref 0.50–1.10)
GFR calc Af Amer: 60 mL/min — ABNORMAL LOW (ref >90)
GFR, EST NON AFRICAN AMERICAN: 52 mL/min — AB (ref >90)
GLUCOSE: 250 mg/dL — AB (ref 70–99)
POTASSIUM: 3.9 meq/L (ref 3.7–5.3)
SODIUM: 132 meq/L — AB (ref 137–147)

## 2013-09-24 LAB — LACTIC ACID, PLASMA: LACTIC ACID, VENOUS: 2.6 mmol/L — AB (ref 0.5–2.2)

## 2013-09-24 LAB — HEPARIN LEVEL (UNFRACTIONATED)
HEPARIN UNFRACTIONATED: 0.7 [IU]/mL (ref 0.30–0.70)
HEPARIN UNFRACTIONATED: 0.71 [IU]/mL — AB (ref 0.30–0.70)

## 2013-09-24 MED ORDER — HEPARIN (PORCINE) IN NACL 100-0.45 UNIT/ML-% IJ SOLN
950.0000 [IU]/h | INTRAMUSCULAR | Status: DC
  Filled 2013-09-24: qty 250

## 2013-09-24 MED ORDER — HALOPERIDOL LACTATE 5 MG/ML IJ SOLN: 1.0000 mg | Freq: Once | INTRAMUSCULAR | Status: DC

## 2013-09-24 MED ORDER — HEPARIN (PORCINE) IN NACL 100-0.45 UNIT/ML-% IJ SOLN
850.0000 [IU]/h | INTRAMUSCULAR | Status: DC
  Administered 2013-09-24: 850 [IU]/h via INTRAVENOUS
  Filled 2013-09-24 (×2): qty 250

## 2013-09-24 MED ORDER — LORAZEPAM 0.5 MG PO TABS
0.5000 mg | ORAL_TABLET | Freq: Three times a day (TID) | ORAL | Status: DC
  Administered 2013-09-24 – 2013-09-29 (×15): 0.5 mg via ORAL
  Filled 2013-09-24 (×15): qty 1

## 2013-09-24 MED ORDER — DILTIAZEM HCL 30 MG PO TABS
30.0000 mg | ORAL_TABLET | Freq: Four times a day (QID) | ORAL | Status: DC
  Administered 2013-09-24 – 2013-09-26 (×8): 30 mg via ORAL
  Filled 2013-09-24 (×12): qty 1

## 2013-09-24 MED ORDER — PREDNISONE 20 MG PO TABS
40.0000 mg | ORAL_TABLET | Freq: Every day | ORAL | Status: DC
  Administered 2013-09-24 – 2013-09-26 (×3): 40 mg via ORAL
  Filled 2013-09-24 (×4): qty 2

## 2013-09-24 MED ORDER — ATORVASTATIN CALCIUM 10 MG PO TABS
10.0000 mg | ORAL_TABLET | Freq: Every day | ORAL | Status: DC
  Administered 2013-09-24 – 2013-09-30 (×7): 10 mg via ORAL
  Filled 2013-09-24 (×8): qty 1

## 2013-09-24 MED ORDER — LORAZEPAM 1 MG PO TABS
1.0000 mg | ORAL_TABLET | Freq: Once | ORAL | Status: AC
  Administered 2013-09-24: 1 mg via ORAL
  Filled 2013-09-24: qty 1

## 2013-09-24 MED ORDER — HALOPERIDOL LACTATE 5 MG/ML IJ SOLN
INTRAMUSCULAR | Status: AC
  Administered 2013-09-24: 1 mg
  Filled 2013-09-24: qty 1

## 2013-09-24 MED ORDER — INSULIN ASPART 100 UNIT/ML ~~LOC~~ SOLN
0.0000 [IU] | Freq: Three times a day (TID) | SUBCUTANEOUS | Status: DC
  Administered 2013-09-25 (×3): 7 [IU] via SUBCUTANEOUS
  Administered 2013-09-25: 3 [IU] via SUBCUTANEOUS
  Administered 2013-09-26: 2 [IU] via SUBCUTANEOUS
  Administered 2013-09-26 (×2): 5 [IU] via SUBCUTANEOUS
  Administered 2013-09-26: 7 [IU] via SUBCUTANEOUS
  Administered 2013-09-27: 1 [IU] via SUBCUTANEOUS
  Administered 2013-09-27: 2 [IU] via SUBCUTANEOUS
  Administered 2013-09-27 (×2): 7 [IU] via SUBCUTANEOUS
  Administered 2013-09-28: 1 [IU] via SUBCUTANEOUS
  Administered 2013-09-28: 5 [IU] via SUBCUTANEOUS
  Administered 2013-09-28: 3 [IU] via SUBCUTANEOUS
  Administered 2013-09-28: 2 [IU] via SUBCUTANEOUS
  Administered 2013-09-29 (×2): 1 [IU] via SUBCUTANEOUS
  Administered 2013-09-29: 3 [IU] via SUBCUTANEOUS
  Administered 2013-09-29: 2 [IU] via SUBCUTANEOUS
  Administered 2013-09-30: 1 [IU] via SUBCUTANEOUS
  Administered 2013-09-30: 3 [IU] via SUBCUTANEOUS
  Administered 2013-09-30: 2 [IU] via SUBCUTANEOUS
  Administered 2013-09-30: 3 [IU] via SUBCUTANEOUS
  Administered 2013-10-01 (×2): 2 [IU] via SUBCUTANEOUS

## 2013-09-24 NOTE — Progress Notes (Signed)
ANTICOAGULATION CONSULT NOTE - Follow Up Consult  Pharmacy Consult for heparin Indication: atrial fibrillation  Allergies  Allergen Reactions  . Penicillins Anaphylaxis    Patient Measurements: Height: 5\' 2"  (157.5 cm) Weight: 151 lb 14.4 oz (68.9 kg) IBW/kg (Calculated) : 50.1 Heparin Dosing Weight: 64kg  Vital Signs: Temp: 98.3 F (36.8 C) (03/24 0400) Temp src: Oral (03/24 0400) BP: 104/64 mmHg (03/24 0530) Pulse Rate: 72 (03/24 0530)  Labs:  Recent Labs  09/22/13 1850  09/23/13 0340 09/23/13 1901 09/23/13 2143 09/24/13 0221 09/24/13 0705 09/24/13 0706  HGB 12.3  --  13.2  --   --  12.9  --   --   HCT 37.2  --  40.0  --   --  37.7  --   --   PLT 269  --  231  --   --  307  --   --   LABPROT  --   --   --   --  14.1  --   --   --   INR  --   --   --   --  1.11  --   --   --   HEPARINUNFRC  --   --   --   --   --   --   --  0.70  CREATININE 0.81  --  1.01 1.00  --  1.00  --   --   TROPONINI  --   < >  --  0.70*  --  0.85* 0.69*  --   < > = values in this interval not displayed.  Estimated Creatinine Clearance: 41.5 ml/min (by C-G formula based on Cr of 1).   Assessment: [age] yoF admitted with HCAP, acute diastolic CHF, AECOPD now in afib with RVR and elevated troponin. MD notes that elevated troponin is likely demand ischemia due to PNA, CHF decompensation, and now afib. Starting IV heparin tonight per pharmacy protocol with plans to d/c when afib rate controlled and back in NSR. Patient received Lovenox 40 mg dose for VTE prophylaxis this AM at 0927. Lovenox d/c'd. Will not bolus heparin due to recent Lovenox administration.  Initial heparin level = 0.7 (at upper limit goal) on 1000 units/hr  Last lovenox 40mg  dose was 0930 3/23 so should have minimal interference with Xa level  CHA2DS2VASc = 7  CBC: Hgb and platelets WNL  Troponin: 0.7, 0.85, 0.69 (suspect demand ischemia)  Goal of Therapy:  Heparin level 0.3-0.7 units/ml Monitor platelets by  anticoagulation protocol: Yes   Plan:   As heparin at upper end goal, will make small reduction and reduce to 950 units/hr  Check 8h heparin level to confirm therapeutic  Daily heparin level and CBC  Await plans for long-term anticoagulation  Juliette Alcideustin Zeigler, PharmD, BCPS.   Pager: [phone]  09/24/2013,7:47 AM

## 2013-09-24 NOTE — Progress Notes (Signed)
PATIENT DETAILS Name: Glenda Underwood Age: 80 y.o. Sex: female Date of Birth: Mar 20, 1933 Admit Date: 09/22/2013 Admitting Physician Edsel Petrin, DO ZOX:[id number] Homero Fellers, MD  Subjective: Less Short of breath-but mildly confused.Went into Aib RVR last night.   Assessment/Plan: Principal Problem:   Acute Hypoxic respiratory failure -multifactorial-combination of HCAP, Acute Diastolic CHF and COPD exac -treat the above, titrate off O2 as tolerated -c/w close monitoring in SDU-if deteriorates will consult PCCM  Active Problems: HCAP -repeat CXR done on 2/23 am shows Bronchopneumonia, given recent hospitalization will treat as HCAP -c/w Vanco/Levaquin and Azactam day 2 -blood cultures neg so far -remains afebrile, mild leukocytosis likely from steroids and is non toxic looking.  Acute Diastolic CHF -given acute resp distress this am -c/w Lasix to BID -monitor daily weights, I &O's -Hold Beta blockers given bronchospasm and recurrent acute resp distress  COPD with acute Exacerbation -never formally diagnosed with COPD-but long time smoker-will need outpatient PFT's -apparently gets deliriuouos with steroids-since clinically improved-will transition from IV  Solumedrol to prednisone, c/w nebs, antibiotics as above  Afib RVR -appreciate cards input -on cardizem gtt with plans to transition to short acting cardizem-per cards, heparin gtt-will need to transition to oral anticoagulation when more stable  Positive Troponins -secondary to demand iscehmia-per cards to manage medically -await Echo  Hx of CAD -hx of PCI in 2013 -c/w ASA, Statins, hold metoprolol given bronchospasm -initial troponins neg-no chest pain-SOB secondary to above noted issues.  HTN -controlled with Clonidine,  Lasix, Cardizem,and Lisinopril -monitor BP and adjust medications accordingly  Dyslipidemia -c/w statins  Hx of Type 2 DM -last A1C-7.2 -appears to be not on any  medications as outpatient -place on SSI while inpatient-on steroids -suspect will benefit from being on metformin on discharge  Tobacco Abuse -counseled extensively -unfortunately was smoking till a few days back prior to this admit -does not think she needs transdermal nicotine  Disposition: Remain inpatient  DVT Prophylaxis: Prophylactic Lovenox   Code Status: Full code   Family Communication None at bedside  Procedures:  None  CONSULTS:  None  Time spent 40 minutes-which includes 50% of the time with face-to-face with patient/ family and coordinating care related to the above assessment and plan.  MEDICATIONS: Scheduled Meds: . aspirin EC  81 mg Oral Daily  . atorvastatin  10 mg Oral q1800  . aztreonam  1 g Intravenous 3 times per day  . cholecalciferol  1,000 Units Oral Daily  . cloNIDine  0.3 mg Oral BID  . diltiazem  30 mg Oral 4 times per day  . DULoxetine  60 mg Oral Daily  . furosemide  40 mg Intravenous Q12H  . hydrALAZINE  50 mg Oral QID  . insulin aspart  0-9 Units Subcutaneous Q4H  . ipratropium  0.5 mg Nebulization Once  . ipratropium  0.5 mg Nebulization Q6H  . levofloxacin (LEVAQUIN) IV  750 mg Intravenous Q48H  . lisinopril  20 mg Oral Daily  . LORazepam  0.5 mg Oral TID  . Memantine HCl ER  28 mg Oral Daily  . metoCLOPramide (REGLAN) injection  5 mg Intravenous 4 times per day  . pantoprazole  40 mg Oral Daily  . potassium chloride  20 mEq Oral Daily  . predniSONE  40 mg Oral Q breakfast  . senna  1 tablet Oral BID  . sodium chloride  3 mL Intravenous Q12H  . traZODone  50 mg Oral QHS,MR X 1  .  vancomycin  500 mg Intravenous Q12H   Continuous Infusions: . sodium chloride 10 mL/hr at 09/23/13 0700  . heparin 950 Units/hr (09/24/13 0830)   PRN Meds:.acetaminophen, acetaminophen, alum & mag hydroxide-simeth, levalbuterol, LORazepam, morphine injection, ondansetron  Antibiotics: Anti-infectives   Start     Dose/Rate Route Frequency  Ordered Stop   09/23/13 1000  vancomycin (VANCOCIN) 500 mg in sodium chloride 0.9 % 100 mL IVPB     500 mg 100 mL/hr over 60 Minutes Intravenous Every 12 hours 09/23/13 0753     09/23/13 0900  levofloxacin (LEVAQUIN) IVPB 750 mg     750 mg 100 mL/hr over 90 Minutes Intravenous Every 48 hours 09/23/13 0735     09/23/13 0900  aztreonam (AZACTAM) 1 g in dextrose 5 % 50 mL IVPB     1 g 100 mL/hr over 30 Minutes Intravenous 3 times per day 09/23/13 0753     09/22/13 2130  aztreonam (AZACTAM) 2 g in dextrose 5 % 50 mL IVPB     2 g 100 mL/hr over 30 Minutes Intravenous  Once 09/22/13 2128 09/22/13 2333   09/22/13 2130  vancomycin (VANCOCIN) IVPB 1000 mg/200 mL premix     1,000 mg 200 mL/hr over 60 Minutes Intravenous  Once 09/22/13 2128 09/23/13 0104       PHYSICAL EXAM: Vital signs in last 24 hours: Filed Vitals:   09/24/13 0600 09/24/13 0715 09/24/13 0800 09/24/13 1000  BP:  137/79 139/56   Pulse: 107   74  Temp:   98.4 F (36.9 C)   TempSrc:   Oral   Resp: 21 24 22 14   Height:      Weight:      SpO2: 84%   95%    Weight change:  Filed Weights   09/23/13 0130 09/23/13 0400  Weight: 70.2 kg (154 lb 12.2 oz) 68.9 kg (151 lb 14.4 oz)   Body mass index is 27.78 kg/(m^2).   Gen Exam: Awake and alert with clear speech.  Mildly tachypneic Neck: Supple, No JVD.  Chest: good air entry-still with rhonchi-but better than yesterday CVS: S1 S2 Regular, no murmurs.  Abdomen: soft, BS +, non tender, non distended.  Extremities: no edema, lower extremities warm to touch. Neurologic: Non Focal.   Skin: No Rash.   Wounds: N/A.   Intake/Output from previous day:  Intake/Output Summary (Last 24 hours) at 09/24/13 1125 Last data filed at 09/24/13 0700  Gross per 24 hour  Intake   1151 ml  Output   2352 ml  Net  -1201 ml     LAB RESULTS: CBC  Recent Labs Lab 09/22/13 1850 09/23/13 0340 09/24/13 0221  WBC 9.7 14.5* 37.1*  HGB 12.3 13.2 12.9  HCT 37.2 40.0 37.7  PLT 269  231 307  MCV 83.8 84.6 81.3  MCH 27.7 27.9 27.8  MCHC 33.1 33.0 34.2  RDW 13.9 13.8 13.8    Chemistries   Recent Labs Lab 09/22/13 1850 09/23/13 0340 09/23/13 1901 09/24/13 0221  NA 139  --  133* 132*  K 3.8  --  2.7* 3.9  CL 102  --  90* 93*  CO2 21  --  19 22  GLUCOSE 137*  --  230* 250*  BUN 12  --  23 23  CREATININE 0.81 1.01 1.00 1.00  CALCIUM 9.0  --  9.4 8.6  MG  --   --  1.7  --     CBG:  Recent Labs Lab 09/23/13 1625 09/23/13 2130  09/24/13 0041 09/24/13 0421 09/24/13 0742  GLUCAP 247* 311* 314* 178* 177*    GFR Estimated Creatinine Clearance: 41.5 ml/min (by C-G formula based on Cr of 1).  Coagulation profile  Recent Labs Lab 09/23/13 2143  INR 1.11    Cardiac Enzymes  Recent Labs Lab 09/23/13 1901 09/24/13 0221 09/24/13 0705  TROPONINI 0.70* 0.85* 0.69*    No components found with this basename: POCBNP,  No results found for this basename: DDIMER,  in the last 72 hours No results found for this basename: HGBA1C,  in the last 72 hours No results found for this basename: CHOL, HDL, LDLCALC, TRIG, CHOLHDL, LDLDIRECT,  in the last 72 hours  Recent Labs  09/23/13 0340  TSH 0.540   No results found for this basename: VITAMINB12, FOLATE, FERRITIN, TIBC, IRON, RETICCTPCT,  in the last 72 hours No results found for this basename: LIPASE, AMYLASE,  in the last 72 hours  Urine Studies No results found for this basename: UACOL, UAPR, USPG, UPH, UTP, UGL, UKET, UBIL, UHGB, UNIT, UROB, ULEU, UEPI, UWBC, URBC, UBAC, CAST, CRYS, UCOM, BILUA,  in the last 72 hours  MICROBIOLOGY: Recent Results (from the past 240 hour(s))  MRSA PCR SCREENING     Status: None   Collection Time    09/23/13  1:28 AM      Result Value Ref Range Status   MRSA by PCR NEGATIVE  NEGATIVE Final   Comment:            The GeneXpert MRSA Assay (FDA     approved for NASAL specimens     only), is one component of a     comprehensive MRSA colonization     surveillance  program. It is not     intended to diagnose MRSA     infection nor to guide or     monitor treatment for     MRSA infections.  CULTURE, BLOOD (ROUTINE X 2)     Status: None   Collection Time    09/23/13 10:00 AM      Result Value Ref Range Status   Specimen Description BLOOD LEFT ARM   Final   Special Requests BOTTLES DRAWN AEROBIC AND ANAEROBIC [facility]   Final   Culture  Setup Time     Final   Value: 09/23/2013 13:20     Performed at [facility]   Culture     Final   Value:        BLOOD CULTURE RECEIVED NO GROWTH TO DATE CULTURE WILL BE HELD FOR 5 DAYS BEFORE ISSUING A FINAL NEGATIVE REPORT     Performed at [facility]   Report Status PENDING   Incomplete  CULTURE, BLOOD (ROUTINE X 2)     Status: None   Collection Time    09/23/13 10:10 AM      Result Value Ref Range Status   Specimen Description BLOOD LEFT HAND   Final   Special Requests BOTTLES DRAWN AEROBIC AND ANAEROBIC 4CC   Final   Culture  Setup Time     Final   Value: 09/23/2013 13:21     Performed at [facility]   Culture     Final   Value:        BLOOD CULTURE RECEIVED NO GROWTH TO DATE CULTURE WILL BE HELD FOR 5 DAYS BEFORE ISSUING A FINAL NEGATIVE REPORT     Performed at [facility]   Report Status PENDING   Incomplete  RADIOLOGY STUDIES/RESULTS: Dg Chest 2 View  09/22/2013   CLINICAL DATA:  Shortness of breath, pneumonia  EXAM: CHEST  2 VIEW  COMPARISON:  DG CHEST 1V PORT dated 08/16/2013  FINDINGS: There is no focal parenchymal opacity, pleural effusion, or pneumothorax. Stable cardiomegaly.  There is mild thoracic spine spondylosis.  IMPRESSION: No active cardiopulmonary disease.   Electronically Signed   By: Elige Ko   On: 09/22/2013 19:04   Dg Chest Port 1 View  09/23/2013   CLINICAL DATA:  Respiratory failure.  EXAM: PORTABLE CHEST - 1 VIEW  COMPARISON:  Chest x-ray 09/22/2013.  FINDINGS: Mild diffuse peribronchial cuffing throughout the lungs bilaterally. Patchy  ill-defined airspace disease throughout the right mid to lower lung. No definite pleural effusions. No cephalization of the pulmonary vasculature. Heart size is mildly enlarged. The patient is rotated to the left on today's exam, resulting in distortion of the mediastinal contours and reduced diagnostic sensitivity and specificity for mediastinal pathology.  IMPRESSION: 1. The appearance the chest suggests bronchitis with multilobar bronchopneumonia in the right lung. 2. Mild cardiomegaly.   Electronically Signed   By: Trudie Reed M.D.   On: 09/23/2013 06:49    Jeoffrey Massed, MD  [facility] Pager:[phone]  If 7PM-7AM, please contact night-coverage [email or web address] Password [facility] 09/24/2013, 11:25 AM   LOS: 2 days

## 2013-09-24 NOTE — Progress Notes (Signed)
  Echocardiogram 2D Echocardiogram has been performed.  Novalyn Lajara, [facility] 09/24/2013, 11:29 AM

## 2013-09-24 NOTE — Consult Note (Signed)
CARDIOLOGY CONSULT NOTE  Patient ID: Glenda Underwood MRN: [id number] DOB/AGE: 78/30/1934 78 y.o.  Admit date: 09/22/2013 Primary Physician Rogelia Boga, MD Primary Cardiologist Dr. Antoine Poche Chief Complaint  Dyspnea  HPI:  The patient has a history of a coronary stent done in [place] (I never received these records) and a history of diastolic HF.  She was admitted yesterday from her assisted living facility with respiratory failure.  CXR did not indicated a pneumonia.  However, subsequent CT scan demonstrated pneumonia.  She did have an elevated proBNP.  She was admitted and treated with BiPAP.  She received Lasix, prednisone, bronchodilators and antibiotics.  She developed atrial fib on the evening of admission.  She did not have this history before.  At the time that the primary service was called with the rapid heart rate the patient had vomited, was breathing 30 times per minute and was diaphoretic although she reported that she was breathing better than she had been.  She was treated with IV Cardizem bolus.  She did have a mild troponin elevation.  She subsequently was started on Cardizem and Heparin drips.  With continue rapid rates she was started on metoprolol IV.  She had continued N/V and PO meds were held.    The patient is now in NSR.  She is comfortable in the bed and is pleasant but oriented only to self.  She did not realize she was in the hospital. The patient denies any new symptoms such as chest discomfort, neck or arm discomfort. There has been no new shortness of breath, PND or orthopnea. There have been no reported palpitations, presyncope or syncope.  (Note:  I had previously obtained the PMH/FH/SOCIAL from her daughter.  I reviewed this again today and no of no changes.  Patient is unable to give details.)   Past Medical History  Diagnosis Date  . CORONARY ARTERY DISEASE 07/20/2010    with stent  . DEPRESSION 07/20/2010  . DM (diabetes mellitus) type II  controlled, neurological manifestation 07/20/2010  . GERD 07/20/2010  . HYPERLIPIDEMIA 07/20/2010  . HYPERTENSION 07/20/2010  . LOW BACK PAIN 07/20/2010  . OSTEOARTHRITIS 07/20/2010  . URINARY INCONTINENCE 07/20/2010  . TIA (transient ischemic attack)   . CAD (coronary artery disease)     Stenting 2008 (no details)  . Dementia   . Hypokalemia   . Spinal stenosis   . Tobacco abuse   . Varicose vein of leg     Past Surgical History  Procedure Laterality Date  . Abdominal hysterectomy    . Vitrectomy Left   . Total knee arthroplasty    . Coronary stent placement    . Varicose vein surgery      Allergies  Allergen Reactions  . Penicillins Anaphylaxis   Prescriptions prior to admission  Medication Sig Dispense Refill  . acetaminophen (TYLENOL) 325 MG tablet Take 650 mg by mouth every 4 (four) hours as needed for pain.      Marland Kitchen aspirin 81 MG tablet Take 81 mg by mouth daily.       . cholecalciferol (VITAMIN D) 1000 UNITS tablet Take 1,000 Units by mouth daily.      . cloNIDine (CATAPRES) 0.2 MG tablet Take 0.3 mg by mouth 2 (two) times daily. Takes 1.5 tablets      . DULoxetine (CYMBALTA) 60 MG capsule Take 1 capsule (60 mg total) by mouth daily.  90 capsule  0  . felodipine (PLENDIL) 10 MG 24 hr tablet Take 1 tablet (10  mg total) by mouth daily.  90 tablet  3  . hydrALAZINE (APRESOLINE) 50 MG tablet Take 50 mg by mouth 4 (four) times daily.       Marland Kitchen. lisinopril (PRINIVIL,ZESTRIL) 20 MG tablet Take 1 tablet (20 mg total) by mouth daily.  90 tablet  3  . LORazepam (ATIVAN) 0.5 MG tablet Take 0.5 mg by mouth 3 (three) times daily.       . Memantine HCl ER (NAMENDA XR) 28 MG CP24 Take 28 mg by mouth daily.      . metoprolol succinate (TOPROL-XL) 100 MG 24 hr tablet Take 50 mg by mouth daily. At 5pm. Hold if pulse is less than 50 Take with or immediately following a meal.      . simvastatin (ZOCOR) 20 MG tablet Take 1 tablet (20 mg total) by mouth daily.  90 tablet  3  . traMADol (ULTRAM) 50 MG  tablet Take 50 mg by mouth every 6 (six) hours as needed (pain).      . traZODone (DESYREL) 50 MG tablet Take 1 tablet (50 mg total) by mouth at bedtime and may repeat dose one time if needed.  90 tablet  0  . albuterol (PROVENTIL) (2.5 MG/3ML) 0.083% nebulizer solution Take 3 mLs (2.5 mg total) by nebulization every 4 (four) hours as needed for wheezing or shortness of breath.  75 mL  0  . ondansetron (ZOFRAN) 4 MG tablet Take 4 mg by mouth every 6 (six) hours as needed for nausea or vomiting.       Family History  Problem Relation Age of Onset  . Hypertension Mother   . Diabetes Mother   . Breast cancer Sister     History   Social History  . Marital Status: Widowed    Spouse Name: N/A    Number of Children: 5  . Years of Education: N/A   Occupational History  . Not on file.   Social History Main Topics  . Smoking status: Current Every Day Smoker -- 0.50 packs/day    Types: Cigarettes  . Smokeless tobacco: Never Used     Comment: smokes 2-3 per day  . Alcohol Use: No  . Drug Use: No  . Sexual Activity: Not Currently   Other Topics Concern  . Not on file   Social History Narrative   [facility] and ambulates with walker.    She has two dogs.       ROS:    As stated in the HPI and negative for all other systems.  (Patient is confused with history of memory disorder).   Physical Exam: Blood pressure 126/56, pulse 74, temperature 98.3 F (36.8 C), temperature source Oral, resp. rate 19, height 5\' 2"  (1.575 m), weight 151 lb 14.4 oz (68.9 kg), SpO2 98.00%.  GENERAL:  Well appearing HEENT:  Pupils equal round and reactive, fundi not visualized, oral mucosa unremarkable, poor dentition NECK:  No jugular venous distention, waveform within normal limits, carotid upstroke brisk and symmetric, no bruits, no thyromegaly LYMPHATICS:  No cervical, inguinal adenopathy LUNGS:  Clear to auscultation bilaterally BACK:  No CVA tenderness CHEST:  Unremarkable HEART:  PMI  not displaced or sustained,S1 and S2 within normal limits, no S3, no S4, no clicks, no rubs, no murmurs (distant heart sounds) ABD:  Flat, positive bowel sounds normal in frequency in pitch, no bruits, no rebound, no guarding, no midline pulsatile mass, no hepatomegaly, no splenomegaly EXT:  2 plus pulses throughout, no edema, no  cyanosis no clubbing SKIN:  No rashes no nodules NEURO:  Cranial nerves II through XII grossly intact, motor grossly intact throughout PSYCH: Oriented to person but confused on place and time   Labs: Lab Results  Component Value Date   BUN 23 09/24/2013   Lab Results  Component Value Date   CREATININE 1.00 09/24/2013   Lab Results  Component Value Date   NA 132* 09/24/2013   K 3.9 09/24/2013   CL 93* 09/24/2013   CO2 22 09/24/2013   Lab Results  Component Value Date   TROPONINI 0.69* 09/24/2013   Lab Results  Component Value Date   WBC 37.1* 09/24/2013   HGB 12.9 09/24/2013   HCT 37.7 09/24/2013   MCV 81.3 09/24/2013   PLT 307 09/24/2013    Lab Results  Component Value Date   ALT 22 09/23/2013   AST 34 09/23/2013   ALKPHOS 122* 09/23/2013   BILITOT 0.6 09/23/2013      Radiology:   Chest CT: 1. Patchy areas of ground-glass in the upper lobes and lower lobes,  suspicious for multilobar pneumonia. Image quality is degraded by  respiratory motion.  2. Trace right pleural fluid.  3. Extensive 3 vessel coronary artery calcification.  EKG:  Atrial fib with rate 146, borderline LBBB, anteroseptal MI old, no acute ST T changes.  09/23/13  21:25  ASSESSMENT AND PLAN:   ATRIAL FIB:  Secondary to acute pulmonary process.  She is now back in NSR.  I will stop the beta blocker and IV Cardizem but start PO Cardizem immediate release.  We can later change this to CD  ELEVATED TROPONIN:  Likely secondary to her acute illness.  No evidence of a ACS although she has known CAD (stent and coronary calcification seen on CT).  At this point I am not going to suggest  further testing other than an echo to make sure that there has been no change in LV function  HTN:  This will be managed in the context of treating the atrial fib.  Continue other meds as listed.    SignedRollene Rotunda 09/24/2013, 7:53 AM

## 2013-09-24 NOTE — Progress Notes (Signed)
ANTICOAGULATION CONSULT NOTE - Follow Up Consult  Pharmacy Consult for heparin Indication: atrial fibrillation  Allergies  Allergen Reactions  . Penicillins Anaphylaxis    Patient Measurements: Height: 5\' 2"  (157.5 cm) Weight: 151 lb 14.4 oz (68.9 kg) IBW/kg (Calculated) : 50.1 Heparin Dosing Weight: 64kg  Vital Signs: Temp: 98.4 F (36.9 C) (03/24 1612) Temp src: Oral (03/24 1612) BP: 134/36 mmHg (03/24 1612) Pulse Rate: 75 (03/24 1612)  Labs:  Recent Labs  09/22/13 1850  09/23/13 0340 09/23/13 1901 09/23/13 2143 09/24/13 0221 09/24/13 0705 09/24/13 0706 09/24/13 1537  HGB 12.3  --  13.2  --   --  12.9  --   --   --   HCT 37.2  --  40.0  --   --  37.7  --   --   --   PLT 269  --  231  --   --  307  --   --   --   LABPROT  --   --   --   --  14.1  --   --   --   --   INR  --   --   --   --  1.11  --   --   --   --   HEPARINUNFRC  --   --   --   --   --   --   --  0.70 0.71*  CREATININE 0.81  --  1.01 1.00  --  1.00  --   --   --   TROPONINI  --   < >  --  0.70*  --  0.85* 0.69*  --   --   < > = values in this interval not displayed.  Estimated Creatinine Clearance: 41.5 ml/min (by C-G formula based on Cr of 1).   Assessment:  [age] yoF admitted with HCAP, acute diastolic CHF, AECOPD now in afib with RVR and elevated troponin. MD notes that elevated troponin is likely demand ischemia due to PNA, CHF decompensation, and now afib. IV heparin started 3/23.    Heparin level = 0.71 despite rate reduction to 950 units/hr (prior heparin level 0.7 on 1000 units/hr)  No complications in therapy noted  Goal of Therapy:  Heparin level 0.3-0.7 units/ml Monitor platelets by anticoagulation protocol: Yes   Plan:   Will decrease heparin drip to 850 units/hr  Check 8h heparin level to confirm therapeutic  Daily heparin level and CBC  Await plans for long-term anticoagulation  Terrilee FilesLeann Brande Uncapher, PharmD   09/24/2013,4:24 PM

## 2013-09-25 DIAGNOSIS — I2510 Atherosclerotic heart disease of native coronary artery without angina pectoris: Secondary | ICD-10-CM

## 2013-09-25 DIAGNOSIS — I5031 Acute diastolic (congestive) heart failure: Secondary | ICD-10-CM

## 2013-09-25 DIAGNOSIS — I509 Heart failure, unspecified: Secondary | ICD-10-CM

## 2013-09-25 LAB — PROCALCITONIN: Procalcitonin: 3.9 ng/mL

## 2013-09-25 LAB — GLUCOSE, CAPILLARY
GLUCOSE-CAPILLARY: 230 mg/dL — AB (ref 70–99)
GLUCOSE-CAPILLARY: 337 mg/dL — AB (ref 70–99)
Glucose-Capillary: 301 mg/dL — ABNORMAL HIGH (ref 70–99)
Glucose-Capillary: 322 mg/dL — ABNORMAL HIGH (ref 70–99)

## 2013-09-25 LAB — CBC
HCT: 35.1 % — ABNORMAL LOW (ref 36.0–46.0)
Hemoglobin: 11.9 g/dL — ABNORMAL LOW (ref 12.0–15.0)
MCH: 28.3 pg (ref 26.0–34.0)
MCHC: 33.9 g/dL (ref 30.0–36.0)
MCV: 83.6 fL (ref 78.0–100.0)
Platelets: 276 10*3/uL (ref 150–400)
RBC: 4.2 MIL/uL (ref 3.87–5.11)
RDW: 14.4 % (ref 11.5–15.5)
WBC: 24.4 10*3/uL — ABNORMAL HIGH (ref 4.0–10.5)

## 2013-09-25 LAB — BASIC METABOLIC PANEL
BUN: 34 mg/dL — ABNORMAL HIGH (ref 6–23)
CO2: 22 mEq/L (ref 19–32)
Calcium: 8.6 mg/dL (ref 8.4–10.5)
Chloride: 98 mEq/L (ref 96–112)
Creatinine, Ser: 1.04 mg/dL (ref 0.50–1.10)
GFR calc non Af Amer: 50 mL/min — ABNORMAL LOW (ref >90)
GFR, EST AFRICAN AMERICAN: 58 mL/min — AB (ref >90)
Glucose, Bld: 237 mg/dL — ABNORMAL HIGH (ref 70–99)
POTASSIUM: 4.2 meq/L (ref 3.7–5.3)
Sodium: 136 mEq/L — ABNORMAL LOW (ref 137–147)

## 2013-09-25 LAB — VANCOMYCIN, TROUGH: Vancomycin Tr: 15.6 ug/mL (ref 10.0–20.0)

## 2013-09-25 LAB — HEPARIN LEVEL (UNFRACTIONATED): HEPARIN UNFRACTIONATED: 0.26 [IU]/mL — AB (ref 0.30–0.70)

## 2013-09-25 MED ORDER — APIXABAN 5 MG PO TABS
5.0000 mg | ORAL_TABLET | Freq: Two times a day (BID) | ORAL | Status: DC
  Administered 2013-09-25 – 2013-09-26 (×3): 5 mg via ORAL
  Filled 2013-09-25 (×4): qty 1

## 2013-09-25 MED ORDER — RIVAROXABAN 15 MG PO TABS
15.0000 mg | ORAL_TABLET | Freq: Every day | ORAL | Status: DC
  Filled 2013-09-25: qty 1

## 2013-09-25 MED ORDER — HEPARIN (PORCINE) IN NACL 100-0.45 UNIT/ML-% IJ SOLN
950.0000 [IU]/h | INTRAMUSCULAR | Status: DC
  Filled 2013-09-25: qty 250

## 2013-09-25 MED ORDER — FUROSEMIDE 10 MG/ML IJ SOLN
40.0000 mg | Freq: Every day | INTRAMUSCULAR | Status: DC
  Filled 2013-09-25: qty 4

## 2013-09-25 NOTE — Progress Notes (Signed)
ANTICOAGULATION CONSULT NOTE - Follow Up Consult  Pharmacy Consult for Heparin Indication: atrial fibrillation  Allergies  Allergen Reactions  . Penicillins Anaphylaxis    Patient Measurements: Height: 5\' 2"  (157.5 cm) Weight: 151 lb 14.4 oz (68.9 kg) IBW/kg (Calculated) : 50.1 Heparin Dosing Weight:   Vital Signs: Temp: 98.3 F (36.8 C) (03/24 1931) Temp src: Oral (03/24 1931) BP: 109/39 mmHg (03/25 0039) Pulse Rate: 73 (03/24 2159)  Labs:  Recent Labs  09/23/13 0340 09/23/13 1901 09/23/13 2143 09/24/13 0221 09/24/13 0705 09/24/13 0706 09/24/13 1537 09/25/13 0130  HGB 13.2  --   --  12.9  --   --   --  11.9*  HCT 40.0  --   --  37.7  --   --   --  35.1*  PLT 231  --   --  307  --   --   --  276  LABPROT  --   --  14.1  --   --   --   --   --   INR  --   --  1.11  --   --   --   --   --   HEPARINUNFRC  --   --   --   --   --  0.70 0.71* 0.26*  CREATININE 1.01 1.00  --  1.00  --   --   --  1.04  TROPONINI  --  0.70*  --  0.85* 0.69*  --   --   --     Estimated Creatinine Clearance: 39.9 ml/min (by C-G formula based on Cr of 1.04).   Medications:  Infusions:  . sodium chloride 10 mL/hr at 09/23/13 0700  . heparin      Assessment: Patient with Heparin level below goal.  No issues per RN.   Goal of Therapy:  Heparin level 0.3-0.7 units/ml Monitor platelets by anticoagulation protocol: Yes   Plan:  Increase heparin to 950 units/hr Recheck level at [address]  Kayelee Herbig Jr, [place]adoJulian Crowford 09/25/2013,2:36 AM

## 2013-09-25 NOTE — Progress Notes (Signed)
PATIENT DETAILS Name: Glenda Underwood Age: 80 y.o. Sex: female Date of Birth: [birth year] Admit Date: 09/22/2013 Admitting Physician Edsel Petrin, DO ZOX:[id number] Homero Fellers, MD    Subjective:  Pt in bed no headache, no chest pain, no cough, improved SOB. No abdominal pain or diarrhea, no focal weakness.    Assessment/Plan:       Acute Hypoxic respiratory failure -multifactorial-combination of HCAP, Acute Diastolic CHF and COPD exac -treat the above, titrate off O2 as tolerated -c/w close monitoring in SDU-if deteriorates will consult PCCM       HCAP -repeat CXR done on 2/23 am shows Bronchopneumonia, given recent hospitalization will treat as HCAP -c/w Vanco/Levaquin and Azactam   -blood cultures neg so far -remains afebrile, improving leukocytosis  ( steroids)  is non toxic looking.       Acute on Chr Diastolic and Systolic CHF Ef 45% (down from few mths ago) -given acute resp distress this am -c/w Lasix to BID -monitor daily weights, I &O's -Hold Beta blockers given bronchospasm and recurrent acute resp distress - On ACE, Cards following       COPD with acute Exacerbation -never formally diagnosed with COPD-but long time smoker-will need outpatient PFT's -apparently gets deliriuouos with steroids-since clinically improved-will transition from IV  Solumedrol to prednisone, c/w nebs, antibiotics as above, on ABX, o2 and nebs as needed.      Afib RVR -appreciate cards input -on Cardizem and heparin drip, will transition to xaralto on 09-25-13     Hx of CAD with mild non-STEMI -hx of PCI in 2013 -NSTEMI c/w ASA, Statins, hold metoprolol given bronchospasm, on Heparin gtt per Cards for atrial fibrillation now to be transitioned to xaralto -initial troponins neg-no chest pain-SOB secondary to above noted issues   -Cards following       HTN -controlled with Clonidine,  Lasix, Cardizem,and Lisinopril -monitor BP and adjust  medications accordingly       Dyslipidemia -c/w statins       Hx of Type 2 DM -last A1C-7.2 -appears to be not on any medications as outpatient -place on SSI while inpatient-on steroids -suspect will benefit from being on metformin on discharge  CBG (last 3)   Recent Labs  09/24/13 1533 09/24/13 1919 09/24/13 2301  GLUCAP 205* 197* 197*         Tobacco Abuse -counseled extensively -unfortunately was smoking till a few days back prior to this admit -does not think she needs transdermal nicotine        DVT Prophylaxis: Prophylactic Heparin/xaralto       Code Status: Full code     Family Communication None at bedside    Procedures:  Echo, Ct Chest    CONSULTS: Cards    Time spent  40 minutes-which includes 50% of the time with face-to-face with patient/ family and coordinating care related to the above assessment and plan.    MEDICATIONS: Scheduled Meds: . aspirin EC  81 mg Oral Daily  . atorvastatin  10 mg Oral q1800  . aztreonam  1 g Intravenous 3 times per day  . cholecalciferol  1,000 Units Oral Daily  . cloNIDine  0.3 mg Oral BID  . diltiazem  30 mg Oral 4 times per day  . DULoxetine  60 mg Oral Daily  . [START ON 09/26/2013] furosemide  40 mg Intravenous Daily  . hydrALAZINE  50 mg Oral QID  . insulin aspart  0-9 Units Subcutaneous TID WC &  HS  . ipratropium  0.5 mg Nebulization Once  . ipratropium  0.5 mg Nebulization Q6H  . levofloxacin (LEVAQUIN) IV  750 mg Intravenous Q48H  . lisinopril  20 mg Oral Daily  . LORazepam  0.5 mg Oral TID  . Memantine HCl ER  28 mg Oral Daily  . metoCLOPramide (REGLAN) injection  5 mg Intravenous 4 times per day  . pantoprazole  40 mg Oral Daily  . potassium chloride  20 mEq Oral Daily  . predniSONE  40 mg Oral Q breakfast  . senna  1 tablet Oral BID  . sodium chloride  3 mL Intravenous Q12H  . traZODone  50 mg Oral QHS,MR X 1  . vancomycin  500 mg Intravenous Q12H    Continuous Infusions: . sodium chloride 10 mL/hr at 09/23/13 0700  . heparin 950 Units/hr (09/25/13 0245)   PRN Meds:.acetaminophen, acetaminophen, alum & mag hydroxide-simeth, levalbuterol, LORazepam, morphine injection, ondansetron  Antibiotics: Anti-infectives   Start     Dose/Rate Route Frequency Ordered Stop   09/23/13 1000  vancomycin (VANCOCIN) 500 mg in sodium chloride 0.9 % 100 mL IVPB     500 mg 100 mL/hr over 60 Minutes Intravenous Every 12 hours 09/23/13 0753     09/23/13 0900  levofloxacin (LEVAQUIN) IVPB 750 mg     750 mg 100 mL/hr over 90 Minutes Intravenous Every 48 hours 09/23/13 0735     09/23/13 0900  aztreonam (AZACTAM) 1 g in dextrose 5 % 50 mL IVPB     1 g 100 mL/hr over 30 Minutes Intravenous 3 times per day 09/23/13 0753     09/22/13 2130  aztreonam (AZACTAM) 2 g in dextrose 5 % 50 mL IVPB     2 g 100 mL/hr over 30 Minutes Intravenous  Once 09/22/13 2128 09/22/13 2333   09/22/13 2130  vancomycin (VANCOCIN) IVPB 1000 mg/200 mL premix     1,000 mg 200 mL/hr over 60 Minutes Intravenous  Once 09/22/13 2128 09/23/13 0104       PHYSICAL EXAM: Vital signs in last 24 hours: Filed Vitals:   09/25/13 0400 09/25/13 0403 09/25/13 0640 09/25/13 0800  BP: 115/36  145/46 121/49  Pulse: 63   73  Temp:  98.3 F (36.8 C)  98.4 F (36.9 C)  TempSrc:  Oral  Oral  Resp: 13   14  Height:      Weight:  66.6 kg (146 lb 13.2 oz)    SpO2: 94%   94%    Weight change:  Filed Weights   09/23/13 0130 09/23/13 0400 09/25/13 0403  Weight: 70.2 kg (154 lb 12.2 oz) 68.9 kg (151 lb 14.4 oz) 66.6 kg (146 lb 13.2 oz)   Body mass index is 26.85 kg/(m^2).   Gen Exam: Awake and alert with clear speech.  Mildly tachypneic Neck: Supple, No JVD.  Chest: good air entry-still with mild bilat rhonchi-  CVS: S1 S2 Regular, no murmurs.  Abdomen: soft, BS +, non tender, non distended.  Extremities: no edema, lower extremities warm to touch. Neurologic: Non Focal.   Skin: No Rash.    Wounds: N/A.   Intake/Output from previous day:  Intake/Output Summary (Last 24 hours) at 09/25/13 0853 Last data filed at 09/25/13 0800  Gross per 24 hour  Intake 1235.25 ml  Output   2875 ml  Net -1639.75 ml     LAB RESULTS: CBC  Recent Labs Lab 09/22/13 1850 09/23/13 0340 09/24/13 0221 09/25/13 0130  WBC 9.7 14.5* 37.1* 24.4*  HGB 12.3 13.2 12.9 11.9*  HCT 37.2 40.0 37.7 35.1*  PLT 269 231 307 276  MCV 83.8 84.6 81.3 83.6  MCH 27.7 27.9 27.8 28.3  MCHC 33.1 33.0 34.2 33.9  RDW 13.9 13.8 13.8 14.4    Chemistries   Recent Labs Lab 09/22/13 1850 09/23/13 0340 09/23/13 1901 09/24/13 0221 09/25/13 0130  NA 139  --  133* 132* 136*  K 3.8  --  2.7* 3.9 4.2  CL 102  --  90* 93* 98  CO2 21  --  19 22 22   GLUCOSE 137*  --  230* 250* 237*  BUN 12  --  23 23 34*  CREATININE 0.81 1.01 1.00 1.00 1.04  CALCIUM 9.0  --  9.4 8.6 8.6  MG  --   --  1.7  --   --     CBG:  Recent Labs Lab 09/24/13 0742 09/24/13 1153 09/24/13 1533 09/24/13 1919 09/24/13 2301  GLUCAP 177* 217* 205* 197* 197*    GFR Estimated Creatinine Clearance: 39.3 ml/min (by C-G formula based on Cr of 1.04).  Coagulation profile  Recent Labs Lab 09/23/13 2143  INR 1.11    Cardiac Enzymes  Recent Labs Lab 09/23/13 1901 09/24/13 0221 09/24/13 0705  TROPONINI 0.70* 0.85* 0.69*    No components found with this basename: POCBNP,  No results found for this basename: DDIMER,  in the last 72 hours No results found for this basename: HGBA1C,  in the last 72 hours No results found for this basename: CHOL, HDL, LDLCALC, TRIG, CHOLHDL, LDLDIRECT,  in the last 72 hours  Recent Labs  09/23/13 0340  TSH 0.540   No results found for this basename: VITAMINB12, FOLATE, FERRITIN, TIBC, IRON, RETICCTPCT,  in the last 72 hours No results found for this basename: LIPASE, AMYLASE,  in the last 72 hours  Urine Studies No results found for this basename: UACOL, UAPR, USPG, UPH, UTP, UGL,  UKET, UBIL, UHGB, UNIT, UROB, ULEU, UEPI, UWBC, URBC, UBAC, CAST, CRYS, UCOM, BILUA,  in the last 72 hours  MICROBIOLOGY: Recent Results (from the past 240 hour(s))  MRSA PCR SCREENING     Status: None   Collection Time    09/23/13  1:28 AM      Result Value Ref Range Status   MRSA by PCR NEGATIVE  NEGATIVE Final   Comment:            The GeneXpert MRSA Assay (FDA     approved for NASAL specimens     only), is one component of a     comprehensive MRSA colonization     surveillance program. It is not     intended to diagnose MRSA     infection nor to guide or     monitor treatment for     MRSA infections.  CULTURE, BLOOD (ROUTINE X 2)     Status: None   Collection Time    09/23/13 10:00 AM      Result Value Ref Range Status   Specimen Description BLOOD LEFT ARM   Final   Special Requests BOTTLES DRAWN AEROBIC AND ANAEROBIC [facility]   Final   Culture  Setup Time     Final   Value: 09/23/2013 13:20     Performed at [facility]   Culture     Final   Value:        BLOOD CULTURE RECEIVED NO GROWTH TO DATE CULTURE WILL BE HELD FOR 5 DAYS BEFORE ISSUING A  FINAL NEGATIVE REPORT     Performed at [facility]   Report Status PENDING   Incomplete  CULTURE, BLOOD (ROUTINE X 2)     Status: None   Collection Time    09/23/13 10:10 AM      Result Value Ref Range Status   Specimen Description BLOOD LEFT HAND   Final   Special Requests BOTTLES DRAWN AEROBIC AND ANAEROBIC 4CC   Final   Culture  Setup Time     Final   Value: 09/23/2013 13:21     Performed at [facility]   Culture     Final   Value:        BLOOD CULTURE RECEIVED NO GROWTH TO DATE CULTURE WILL BE HELD FOR 5 DAYS BEFORE ISSUING A FINAL NEGATIVE REPORT     Performed at [facility]   Report Status PENDING   Incomplete    RADIOLOGY STUDIES/RESULTS: Dg Chest 2 View  09/22/2013   CLINICAL DATA:  Shortness of breath, pneumonia  EXAM: CHEST  2 VIEW  COMPARISON:  DG CHEST 1V PORT dated 08/16/2013   FINDINGS: There is no focal parenchymal opacity, pleural effusion, or pneumothorax. Stable cardiomegaly.  There is mild thoracic spine spondylosis.  IMPRESSION: No active cardiopulmonary disease.   Electronically Signed   By: Elige Ko   On: 09/22/2013 19:04   Dg Chest Port 1 View  09/23/2013   CLINICAL DATA:  Respiratory failure.  EXAM: PORTABLE CHEST - 1 VIEW  COMPARISON:  Chest x-ray 09/22/2013.  FINDINGS: Mild diffuse peribronchial cuffing throughout the lungs bilaterally. Patchy ill-defined airspace disease throughout the right mid to lower lung. No definite pleural effusions. No cephalization of the pulmonary vasculature. Heart size is mildly enlarged. The patient is rotated to the left on today's exam, resulting in distortion of the mediastinal contours and reduced diagnostic sensitivity and specificity for mediastinal pathology.  IMPRESSION: 1. The appearance the chest suggests bronchitis with multilobar bronchopneumonia in the right lung. 2. Mild cardiomegaly.   Electronically Signed   By: Trudie Reed M.D.   On: 09/23/2013 06:49    Leroy Sea, MD  [facility] Pager:[phone]  If 7PM-7AM, please contact night-coverage [email or web address] Password [facility] 09/25/2013, 8:53 AM   LOS: 3 days

## 2013-09-25 NOTE — Progress Notes (Signed)
SUBJECTIVE:  Breathing better  OBJECTIVE:   Vitals:   Filed Vitals:   09/25/13 0400 09/25/13 0403 09/25/13 0640 09/25/13 0800  BP: 115/36  145/46 121/49  Pulse: 63   73  Temp:  98.3 F (36.8 C)    TempSrc:  Oral    Resp: 13   14  Height:      Weight:  146 lb 13.2 oz (66.6 kg)    SpO2: 94%   94%   I&O's:   Intake/Output Summary (Last 24 hours) at 09/25/13 0818 Last data filed at 09/25/13 0800  Gross per 24 hour  Intake 1235.25 ml  Output   2875 ml  Net -1639.75 ml   TELEMETRY: Reviewed telemetry pt in NSR:     PHYSICAL EXAM General: Well developed, well nourished, in no acute distress Head: Eyes PERRLA, No xanthomas.   Normal cephalic and atramatic  Lungs:   Clear bilaterally to auscultation and percussion. Heart:   HRRR S1 S2 Pulses are 2+ & equal. Abdomen: Bowel sounds are positive, abdomen soft and non-tender without masses Extremities:   No clubbing, cyanosis or edema.  DP +1 Neuro: Alert and oriented X 3. Psych:  Good affect, responds appropriately   LABS: Basic Metabolic Panel:  Recent Labs  16/04/9602/23/15 1901 09/24/13 0221 09/25/13 0130  NA 133* 132* 136*  K 2.7* 3.9 4.2  CL 90* 93* 98  CO2 19 22 22   GLUCOSE 230* 250* 237*  BUN 23 23 34*  CREATININE 1.00 1.00 1.04  CALCIUM 9.4 8.6 8.6  MG 1.7  --   --    Liver Function Tests:  Recent Labs  09/22/13 1850 09/23/13 1901  AST 24 34  ALT 18 22  ALKPHOS 120* 122*  BILITOT 0.5 0.6  PROT 7.1 7.6  ALBUMIN 3.5 3.5   No results found for this basename: LIPASE, AMYLASE,  in the last 72 hours CBC:  Recent Labs  09/24/13 0221 09/25/13 0130  WBC 37.1* 24.4*  HGB 12.9 11.9*  HCT 37.7 35.1*  MCV 81.3 83.6  PLT 307 276   Cardiac Enzymes:  Recent Labs  09/23/13 1901 09/24/13 0221 09/24/13 0705  TROPONINI 0.70* 0.85* 0.69*   BNP: No components found with this basename: POCBNP,  D-Dimer: No results found for this basename: DDIMER,  in the last 72 hours Hemoglobin A1C: No results found  for this basename: HGBA1C,  in the last 72 hours Fasting Lipid Panel: No results found for this basename: CHOL, HDL, LDLCALC, TRIG, CHOLHDL, LDLDIRECT,  in the last 72 hours Thyroid Function Tests:  Recent Labs  09/23/13 0340  TSH 0.540   Anemia Panel: No results found for this basename: VITAMINB12, FOLATE, FERRITIN, TIBC, IRON, RETICCTPCT,  in the last 72 hours Coag Panel:   Lab Results  Component Value Date   INR 1.11 09/23/2013   INR 0.92 06/02/2012    RADIOLOGY: Dg Chest 2 View  09/22/2013   CLINICAL DATA:  Shortness of breath, pneumonia  EXAM: CHEST  2 VIEW  COMPARISON:  DG CHEST 1V PORT dated 08/16/2013  FINDINGS: There is no focal parenchymal opacity, pleural effusion, or pneumothorax. Stable cardiomegaly.  There is mild thoracic spine spondylosis.  IMPRESSION: No active cardiopulmonary disease.   Electronically Signed   By: Elige KoHetal  Patel   On: 09/22/2013 19:04   Ct Chest W Contrast  09/23/2013   CLINICAL DATA:  Shortness of breath and cough.  EXAM: CT CHEST WITH CONTRAST  TECHNIQUE: Multidetector CT imaging of the chest was performed during intravenous  contrast administration.  CONTRAST:  80mL OMNIPAQUE IOHEXOL 300 MG/ML  SOLN  COMPARISON:  Insert priors  FINDINGS: No pathologically enlarged mediastinal, hilar or axillary lymph nodes. Atherosclerotic calcification of the arterial vasculature including extensive three-vessel involvement of the coronary arteries. Heart is enlarged. No pericardial effusion.  Trace right pleural effusion. Image quality is degraded by respiratory motion. Patchy areas of ground-glass in the apices of both upper lobes, right greater than left. Patchy ground-glass airspace disease in the right lower lobe with consolidation dependently. Minimal volume loss in the left lower lobe with probable mild ground-glass in the left lower lobe (image 41). Airway is unremarkable.  Incidental imaging of the upper abdomen shows the visualized portions of the liver, adrenal  glands, kidneys, spleen, pancreas, stomach and bowel to be grossly unremarkable. No upper abdominal adenopathy. No worrisome lytic or sclerotic lesions. Degenerative changes are seen in the spine.  IMPRESSION: 1. Patchy areas of ground-glass in the upper lobes and lower lobes, suspicious for multilobar pneumonia. Image quality is degraded by respiratory motion. 2. Trace right pleural fluid. 3. Extensive 3 vessel coronary artery calcification.   Electronically Signed   By: Leanna Battles M.D.   On: 09/23/2013 17:08   Dg Chest Port 1 View  09/24/2013   CLINICAL DATA:  Shortness of breath.  EXAM: PORTABLE CHEST - 1 VIEW  COMPARISON:  Chest x-ray and CT scan dated 09/23/2013  FINDINGS: There is persistent cardiomegaly with slight pulmonary vascular congestion. Increased density in the right has improved with some residual density at the right base. This may represent faint pulmonary edema. Left lung is clear.  IMPRESSION: Improved faint infiltrates on the right. I suspect the patient may have slight pulmonary edema.   Electronically Signed   By: Geanie Cooley M.D.   On: 09/24/2013 07:59   Dg Chest Port 1 View  09/23/2013   CLINICAL DATA:  Respiratory failure.  EXAM: PORTABLE CHEST - 1 VIEW  COMPARISON:  Chest x-ray 09/22/2013.  FINDINGS: Mild diffuse peribronchial cuffing throughout the lungs bilaterally. Patchy ill-defined airspace disease throughout the right mid to lower lung. No definite pleural effusions. No cephalization of the pulmonary vasculature. Heart size is mildly enlarged. The patient is rotated to the left on today's exam, resulting in distortion of the mediastinal contours and reduced diagnostic sensitivity and specificity for mediastinal pathology.  IMPRESSION: 1. The appearance the chest suggests bronchitis with multilobar bronchopneumonia in the right lung. 2. Mild cardiomegaly.   Electronically Signed   By: Trudie Reed M.D.   On: 09/23/2013 06:49   Dg Abd Portable 1v  09/23/2013    CLINICAL DATA:  Projectile vomiting.  EXAM: PORTABLE ABDOMEN - 1 VIEW  COMPARISON:  None.  FINDINGS: There is mild motion artifact. Gas is present in the multiple grossly nondilated loops of small large bowel throughout the abdomen and pelvis. No abnormal soft tissue calcification is identified. Multilevel osteophytosis is present in the visualized thoracic spine and lumbar spine.  IMPRESSION: No evidence of bowel obstruction.   Electronically Signed   By: Sebastian Ache   On: 09/23/2013 22:26   ASSESSMENT AND PLAN:  1.  ATRIAL FIB: Secondary to acute pulmonary process. She is now back in NSR.Beta blocker and IV Cardizem were stopped and she is not on PO Cardizem immediate release. We can later change this to CD  2. ELEVATED TROPONIN: Likely secondary to her acute illness. No evidence of a ACS although she has known CAD (stent and coronary calcification seen on CT). 2D echo  showed EF 40-45% with hypokinesis of the mid and distal anteroseptum.  This is a new finding since echo in 08/2013 and 06/2012.  She has not had any chest pain since admission.  Will need evaluation for progression of CAD once her pulmonary process has resolved. Continue ASA.  Need to try to get old cath records from [place]. 3.  HTN: This will be managed in the context of treating the atrial fib. Continue other meds as listed.  4.  ACUTE on CHRONIC DIASTOLIC CHF - She is currently on IV Lasix and is net -5L out and she is down 5 lbs - continue IV diuretics.  Decrease Lasix to 40mg  IV daily 5.  PNA on antibx        Quintella Reichert, MD  09/25/2013  8:18 AM

## 2013-09-25 NOTE — Progress Notes (Signed)
ANTICOAGULATION CONSULT NOTE - Follow Up Consult  Pharmacy Consult for Heparin --> Apixaban Indication: atrial fibrillation  Allergies  Allergen Reactions  . Penicillins Anaphylaxis    Patient Measurements: Height: 5\' 2"  (157.5 cm) Weight: 146 lb 13.2 oz (66.6 kg) IBW/kg (Calculated) : 50.1 Heparin Dosing Weight:   Vital Signs: Temp: 98.4 F (36.9 C) (03/25 0800) Temp src: Oral (03/25 0800) BP: 121/49 mmHg (03/25 0800) Pulse Rate: 73 (03/25 0800)  Labs:  Recent Labs  09/23/13 0340 09/23/13 1901 09/23/13 2143 09/24/13 0221 09/24/13 0705 09/24/13 0706 09/24/13 1537 09/25/13 0130  HGB 13.2  --   --  12.9  --   --   --  11.9*  HCT 40.0  --   --  37.7  --   --   --  35.1*  PLT 231  --   --  307  --   --   --  276  LABPROT  --   --  14.1  --   --   --   --   --   INR  --   --  1.11  --   --   --   --   --   HEPARINUNFRC  --   --   --   --   --  0.70 0.71* 0.26*  CREATININE 1.01 1.00  --  1.00  --   --   --  1.04  TROPONINI  --  0.70*  --  0.85* 0.69*  --   --   --     Estimated Creatinine Clearance: 39.3 ml/min (by C-G formula based on Cr of 1.04).   Medications:  Infusions:  . sodium chloride 10 mL/hr at 09/23/13 0700  . heparin 950 Units/hr (09/25/13 0245)    Assessment: [age] yoF admitted with HCAP, acute diastolic CHF, AECOPD now in afib with RVR and elevated troponin. MD notes that elevated troponin is likely demand ischemia due to PNA, CHF decompensation, and now afib. Starting IV heparin tonight per pharmacy protocol with plans to d/c when afib rate controlled and back in NSR. Patient received Lovenox 40 mg dose for VTE prophylaxis this 3/23am then started heparin gtt, orders to transition to rivaroxaban, spoke with Dr Thedore MinsSingh re: Xalrelto's recommendation in PI to not use with CrCl 15-3880ml/min while on concomitant moderate CYP3A4 + P-gp inhibitor, such as diltiazem.  Received orders to change to apixaban   Remains on heparin gtt, rate adjusted this am for low  level  CBC: Hgb = 11.9, platelets = 276  SCr = 1.04 for est CrCl = 3946ml/min (using ABW as per studies)  Potential interactions:  ASA 81mg  PO daily for CAD   Goal of Therapy:  Heparin level 0.3-0.7 units/ml Monitor platelets by anticoagulation protocol: Yes   Plan:   Does not meet 2 of 3 criteria (age 12>80, Scr >1.5, wt <60kg) to require dose reduction, thus start Apixaban 5mg  po BID  Monitor for bleeding  Juliette Alcideustin Zeigler, PharmD, BCPS.   Pager: [phone]  09/25/2013,9:14 AM

## 2013-09-25 NOTE — Progress Notes (Signed)
ANTIBIOTIC CONSULT NOTE - FOLLOW UP  Pharmacy Consult for vancomycin/aztreonam Indication: pneumonia  Allergies  Allergen Reactions  . Penicillins Anaphylaxis    Patient Measurements: Height: 5\' 2"  (157.5 cm) Weight: 146 lb 13.2 oz (66.6 kg) IBW/kg (Calculated) : 50.1 Adjusted Body Weight:   Vital Signs: Temp: 98.4 F (36.9 C) (03/25 0800) Temp src: Oral (03/25 0800) BP: 121/49 mmHg (03/25 0800) Pulse Rate: 73 (03/25 0800) Intake/Output from previous day: 03/24 0701 - 03/25 0700 In: 1225.8 [P.O.:480; I.V.:395.8; IV Piggyback:350] Out: 2875 [Urine:2875] Intake/Output from this shift: Total I/O In: 599.5 [P.O.:480; I.V.:19.5; IV Piggyback:100] Out: 400 [Urine:400]  Labs:  Recent Labs  09/23/13 0340 09/23/13 1901 09/24/13 0221 09/25/13 0130  WBC 14.5*  --  37.1* 24.4*  HGB 13.2  --  12.9 11.9*  PLT 231  --  307 276  CREATININE 1.01 1.00 1.00 1.04   Estimated Creatinine Clearance: 39.3 ml/min (by C-G formula based on Cr of 1.04).  Recent Labs  09/25/13 1039  VANCOTROUGH 15.6     Microbiology: Recent Results (from the past 720 hour(s))  MRSA PCR SCREENING     Status: None   Collection Time    09/23/13  1:28 AM      Result Value Ref Range Status   MRSA by PCR NEGATIVE  NEGATIVE Final   Comment:            The GeneXpert MRSA Assay (FDA     approved for NASAL specimens     only), is one component of a     comprehensive MRSA colonization     surveillance program. It is not     intended to diagnose MRSA     infection nor to guide or     monitor treatment for     MRSA infections.  CULTURE, BLOOD (ROUTINE X 2)     Status: None   Collection Time    09/23/13 10:00 AM      Result Value Ref Range Status   Specimen Description BLOOD LEFT ARM   Final   Special Requests BOTTLES DRAWN AEROBIC AND ANAEROBIC [facility]   Final   Culture  Setup Time     Final   Value: 09/23/2013 13:20     Performed at [facility]   Culture     Final   Value:        BLOOD  CULTURE RECEIVED NO GROWTH TO DATE CULTURE WILL BE HELD FOR 5 DAYS BEFORE ISSUING A FINAL NEGATIVE REPORT     Performed at [facility]   Report Status PENDING   Incomplete  CULTURE, BLOOD (ROUTINE X 2)     Status: None   Collection Time    09/23/13 10:10 AM      Result Value Ref Range Status   Specimen Description BLOOD LEFT HAND   Final   Special Requests BOTTLES DRAWN AEROBIC AND ANAEROBIC 4CC   Final   Culture  Setup Time     Final   Value: 09/23/2013 13:21     Performed at [facility]   Culture     Final   Value:        BLOOD CULTURE RECEIVED NO GROWTH TO DATE CULTURE WILL BE HELD FOR 5 DAYS BEFORE ISSUING A FINAL NEGATIVE REPORT     Performed at [facility]   Report Status PENDING   Incomplete    Anti-infectives   Start     Dose/Rate Route Frequency Ordered Stop   09/23/13 1000  vancomycin (VANCOCIN) 500 mg in sodium chloride 0.9 % 100 mL IVPB     500 mg 100 mL/hr over 60 Minutes Intravenous Every 12 hours 09/23/13 0753     09/23/13 0900  levofloxacin (LEVAQUIN) IVPB 750 mg     750 mg 100 mL/hr over 90 Minutes Intravenous Every 48 hours 09/23/13 0735     09/23/13 0900  aztreonam (AZACTAM) 1 g in dextrose 5 % 50 mL IVPB     1 g 100 mL/hr over 30 Minutes Intravenous 3 times per day 09/23/13 0753     09/22/13 2130  aztreonam (AZACTAM) 2 g in dextrose 5 % 50 mL IVPB     2 g 100 mL/hr over 30 Minutes Intravenous  Once 09/22/13 2128 09/22/13 2333   09/22/13 2130  vancomycin (VANCOCIN) IVPB 1000 mg/200 mL premix     1,000 mg 200 mL/hr over 60 Minutes Intravenous  Once 09/22/13 2128 09/23/13 0104      Assessment: 81 YOF presenting with dyspnea from ALF. Given one-time doses of vancomycin and aztreonam in ED. Rounding physician states he wishes to continue vancomycin, aztreonam, and levofloxacin.   3/23 >>vancomycin >> 3/23 >>aztreonam >>  3/23 >> levofloxacin >>  Tmax: afeb WBCs: 24.4 (on solu-medrol --> prednisone) Renal:SCr =1.04 for est CrCl =  76ml/min (C-G) and 20ml/min (N), excellent UOP  3/23 blood: NGTD  Drug level / dose changes info: 3/25: 1030 VT = 15.55mcg/ml on 500mg  IV q12h  Goal of Therapy:  Vancomycin trough level 15-20 mcg/ml  Plan:   Continue vancomycin 500mg  IV q12h as trough within goal range  Coninue aztreonam and levofloxacin as ordered - doses remain appropriate for renal function  Juliette Alcide, PharmD, BCPS.   Pager: [phone]  09/25/2013,11:41 AM

## 2013-09-25 NOTE — Progress Notes (Signed)
Inpatient Diabetes Program Recommendations  AACE/ADA: New Consensus Statement on Inpatient Glycemic Control (2013)  Target Ranges:  Prepandial:   less than 140 mg/dL      Peak postprandial:   less than 180 mg/dL (1-2 hours)      Critically ill patients:  140 - 180 mg/dL     Results for Glenda Underwood, [name] (MRN [id number]) as of 09/25/2013 08:26  Ref. Range 09/24/2013 07:42 09/24/2013 11:53 09/24/2013 15:33 09/24/2013 19:19 09/24/2013 23:01  Glucose-Capillary Latest Range: 70-99 mg/dL 403177 (H) 474217 (H) 259205 (H) 197 (H) 197 (H)     **Pt having elevated glucose levels.  Currently getting Prednisone 40 mg daily.  **Note pt started on Carbohydrate Modified diet late last night   MD- Please consider increasing Novolog SSI to Moderate scale tid ac + HS (currently getting Sensitive scale coverage)   Will follow. Ambrose FinlandJeannine Johnston Keyshun Elpers RN, MSN, CDE Diabetes Coordinator Inpatient Diabetes Program Team Pager: [phone] (8a-10p)

## 2013-09-26 DIAGNOSIS — I10 Essential (primary) hypertension: Secondary | ICD-10-CM

## 2013-09-26 DIAGNOSIS — I214 Non-ST elevation (NSTEMI) myocardial infarction: Secondary | ICD-10-CM

## 2013-09-26 LAB — BASIC METABOLIC PANEL
BUN: 41 mg/dL — AB (ref 6–23)
CO2: 21 mEq/L (ref 19–32)
Calcium: 8.4 mg/dL (ref 8.4–10.5)
Chloride: 102 mEq/L (ref 96–112)
Creatinine, Ser: 1.1 mg/dL (ref 0.50–1.10)
GFR calc non Af Amer: 46 mL/min — ABNORMAL LOW (ref >90)
GFR, EST AFRICAN AMERICAN: 54 mL/min — AB (ref >90)
Glucose, Bld: 179 mg/dL — ABNORMAL HIGH (ref 70–99)
POTASSIUM: 4.5 meq/L (ref 3.7–5.3)
Sodium: 136 mEq/L — ABNORMAL LOW (ref 137–147)

## 2013-09-26 LAB — CBC
HCT: 35 % — ABNORMAL LOW (ref 36.0–46.0)
HEMOGLOBIN: 11.4 g/dL — AB (ref 12.0–15.0)
MCH: 27.5 pg (ref 26.0–34.0)
MCHC: 32.6 g/dL (ref 30.0–36.0)
MCV: 84.3 fL (ref 78.0–100.0)
Platelets: 240 10*3/uL (ref 150–400)
RBC: 4.15 MIL/uL (ref 3.87–5.11)
RDW: 14.1 % (ref 11.5–15.5)
WBC: 18.3 10*3/uL — AB (ref 4.0–10.5)

## 2013-09-26 LAB — GLUCOSE, CAPILLARY
GLUCOSE-CAPILLARY: 189 mg/dL — AB (ref 70–99)
GLUCOSE-CAPILLARY: 349 mg/dL — AB (ref 70–99)
GLUCOSE-CAPILLARY: 298 mg/dL — AB (ref 70–99)
Glucose-Capillary: 293 mg/dL — ABNORMAL HIGH (ref 70–99)

## 2013-09-26 LAB — PRO B NATRIURETIC PEPTIDE: PRO B NATRI PEPTIDE: 8759 pg/mL — AB (ref 0–450)

## 2013-09-26 MED ORDER — DILTIAZEM HCL ER COATED BEADS 240 MG PO CP24
240.0000 mg | ORAL_CAPSULE | Freq: Every day | ORAL | Status: DC
  Filled 2013-09-26: qty 1

## 2013-09-26 MED ORDER — FUROSEMIDE 40 MG PO TABS
40.0000 mg | ORAL_TABLET | Freq: Every day | ORAL | Status: DC
  Administered 2013-09-26 – 2013-09-27 (×2): 40 mg via ORAL
  Filled 2013-09-26 (×4): qty 1

## 2013-09-26 MED ORDER — IPRATROPIUM BROMIDE 0.02 % IN SOLN: 0.5000 mg | Freq: Four times a day (QID) | RESPIRATORY_TRACT | Status: DC | PRN

## 2013-09-26 MED ORDER — HEPARIN SODIUM (PORCINE) 5000 UNIT/ML IJ SOLN
5000.0000 [IU] | Freq: Three times a day (TID) | INTRAMUSCULAR | Status: DC
  Administered 2013-09-27 – 2013-10-01 (×13): 5000 [IU] via SUBCUTANEOUS
  Filled 2013-09-26 (×16): qty 1

## 2013-09-26 MED ORDER — DILTIAZEM HCL ER COATED BEADS 120 MG PO CP24
120.0000 mg | ORAL_CAPSULE | Freq: Every day | ORAL | Status: DC
  Administered 2013-09-26 – 2013-10-01 (×6): 120 mg via ORAL
  Filled 2013-09-26 (×7): qty 1

## 2013-09-26 MED ORDER — POLYETHYLENE GLYCOL 3350 17 G PO PACK
17.0000 g | PACK | Freq: Every day | ORAL | Status: DC
  Administered 2013-09-26 – 2013-10-01 (×6): 17 g via ORAL
  Filled 2013-09-26 (×6): qty 1

## 2013-09-26 MED ORDER — LEVALBUTEROL HCL 0.63 MG/3ML IN NEBU
0.6300 mg | INHALATION_SOLUTION | Freq: Four times a day (QID) | RESPIRATORY_TRACT | Status: DC | PRN
  Administered 2013-09-29: 0.63 mg via RESPIRATORY_TRACT
  Filled 2013-09-26: qty 3

## 2013-09-26 MED ORDER — PREDNISONE 20 MG PO TABS
20.0000 mg | ORAL_TABLET | Freq: Every day | ORAL | Status: DC
  Administered 2013-09-27: 20 mg via ORAL
  Filled 2013-09-26 (×2): qty 1

## 2013-09-26 MED ORDER — DOCUSATE SODIUM 100 MG PO CAPS
200.0000 mg | ORAL_CAPSULE | Freq: Two times a day (BID) | ORAL | Status: DC
  Administered 2013-09-26 – 2013-10-01 (×11): 200 mg via ORAL
  Filled 2013-09-26 (×12): qty 2

## 2013-09-26 MED ORDER — FUROSEMIDE 40 MG PO TABS
40.0000 mg | ORAL_TABLET | Freq: Two times a day (BID) | ORAL | Status: DC
  Filled 2013-09-26 (×3): qty 1

## 2013-09-26 NOTE — Evaluation (Signed)
Physical Therapy Evaluation Patient Details Name: Glenda StairsHilda A Underwood MRN: [id number] DOB: 01/01/1933 Today's Date: 09/26/2013   History of Present Illness  80 yo female admitted with actue on chronic respiratory failure, A fib. hx of TIA, dementia, spinal stenosis  Clinical Impression  On eval, pt required Min assist for mobility-able to ambulate ~75 feet with walker. O2 sats 98% on RA during session. Son present and stated plan [facility]. Recommend HHPT at ALF    Follow Up Recommendations Home health PT;Supervision/Assistance - 24 hour (at ALF)    Equipment Recommendations  None recommended by PT    Recommendations for Other Services       Precautions / Restrictions Precautions Precautions: Fall Restrictions Weight Bearing Restrictions: No      Mobility  Bed Mobility Overal bed mobility: Needs Assistance Bed Mobility: Sit to Supine;Supine to Sit     Supine to sit: Min guard Sit to supine: Min assist   General bed mobility comments: Assist for LEs back onto bed  Transfers Overall transfer level: Needs assistance Equipment used: Rolling walker (2 wheeled) Transfers: Sit to/from Stand Sit to Stand: Min guard         General transfer comment: close guard for safety. VCS safety, hand placement  Ambulation/Gait Ambulation/Gait assistance: Min guard Ambulation Distance (Feet): 75 Feet Assistive device: Rolling walker (2 wheeled) Gait Pattern/deviations: Decreased stride length     General Gait Details: slow gait speed. fatigues fairly easily. no LOB with use of walker  Underwood            Wheelchair Mobility    Modified Rankin (Stroke Patients Only)       Balance                                     Pertinent Vitals/Pain 99% 2L at rest; 98% RA at rest; 98-99% RA during ambulation    Home Living Family/patient expects to be discharged to:: Other (Comment)     Type of Home: Assisted living [facility] Home Access: Level entry     Home Layout: One level Home Equipment: Environmental consultantWalker - 2 wheels;Cane - single point      Prior Function Level of Independence: Needs assistance   Gait / Transfers Assistance Needed: pt states she walks to dining room with walker  ADL's / Homemaking Assistance Needed: receives assistance with bathing, dressing        Hand Dominance        Extremity/Trunk Assessment   Upper Extremity Assessment: Generalized weakness           Lower Extremity Assessment: Generalized weakness      Cervical / Trunk Assessment: Normal  Communication   Communication: No difficulties  Cognition Arousal/Alertness: Awake/alert Behavior During Therapy: WFL for tasks assessed/performed Overall Cognitive Status: History of cognitive impairments - at baseline                      General Comments      Exercises        Assessment/Plan    PT Assessment Patient needs continued PT services  PT Diagnosis Difficulty walking;Generalized weakness   PT Problem List Decreased strength;Decreased activity tolerance;Decreased mobility  PT Treatment Interventions DME instruction;Gait training;Functional mobility training;Therapeutic activities;Therapeutic exercise;Patient/family education   PT Goals (Current goals can be found in the Care Plan section) Acute Rehab PT Goals Patient Stated Goal:  return to [facility] per son PT Goal Formulation: With patient/family Time For Goal Achievement: 10/10/13 Potential to Achieve Goals: Good    Frequency Min 3X/week   Barriers to discharge        End of Session Equipment Utilized During Treatment: Gait belt Activity Tolerance: Patient limited by fatigue Patient left: in bed;with call bell/phone within reach;with bed alarm set         Time: 1410-1436 PT Time Calculation (min): 26 min   Charges:   PT Evaluation $Initial PT Evaluation Tier I: 1 Procedure PT Treatments $Gait Training: 8-22  mins $Therapeutic Activity: 8-22 mins   PT G Codes:          Rebeca Alert, MPT Pager: [phone]

## 2013-09-26 NOTE — Progress Notes (Signed)
SUBJECTIVE:  No complaints today  OBJECTIVE:   Vitals:   Filed Vitals:   09/25/13 1538 09/25/13 2056 09/26/13 0226 09/26/13 0514  BP: 118/53 143/60 104/51 106/52  Pulse: 71 83 57 57  Temp: 98.4 F (36.9 C) 98.1 F (36.7 C) 98 F (36.7 C) 97.4 F (36.3 C)  TempSrc: Oral Oral Oral Oral  Resp: 18 16 16 14   Height:      Weight:      SpO2: 96% 99% 100% 99%   I&O's:   Intake/Output Summary (Last 24 hours) at 09/26/13 0726 Last data filed at 09/26/13 0515  Gross per 24 hour  Intake 1889.5 ml  Output   2450 ml  Net -560.5 ml   TELEMETRY: Reviewed telemetry pt in NSR:     PHYSICAL EXAM General: Well developed, well nourished, in no acute distress Head: Eyes PERRLA, No xanthomas.   Normal cephalic and atramatic  Lungs:   Crackles at bases Heart:   HRRR S1 S2 Pulses are 2+ & equal. Abdomen: Bowel sounds are positive, abdomen soft and non-tender without masses  Extremities:   No clubbing, cyanosis or edema.  DP +1 Neuro: Alert and oriented X 3. Psych:  Good affect, responds appropriately   LABS: Basic Metabolic Panel:  Recent Labs  16/04/9602/23/15 1901  09/25/13 0130 09/26/13 0345  NA 133*  < > 136* 136*  K 2.7*  < > 4.2 4.5  CL 90*  < > 98 102  CO2 19  < > 22 21  GLUCOSE 230*  < > 237* 179*  BUN 23  < > 34* 41*  CREATININE 1.00  < > 1.04 1.10  CALCIUM 9.4  < > 8.6 8.4  MG 1.7  --   --   --   < > = values in this interval not displayed. Liver Function Tests:  Recent Labs  09/23/13 1901  AST 34  ALT 22  ALKPHOS 122*  BILITOT 0.6  PROT 7.6  ALBUMIN 3.5   No results found for this basename: LIPASE, AMYLASE,  in the last 72 hours CBC:  Recent Labs  09/25/13 0130 09/26/13 0345  WBC 24.4* 18.3*  HGB 11.9* 11.4*  HCT 35.1* 35.0*  MCV 83.6 84.3  PLT 276 240   Cardiac Enzymes:  Recent Labs  09/23/13 1901 09/24/13 0221 09/24/13 0705  TROPONINI 0.70* 0.85* 0.69*   BNP: No components found with this basename: POCBNP,  D-Dimer: No results found for  this basename: DDIMER,  in the last 72 hours Hemoglobin A1C: No results found for this basename: HGBA1C,  in the last 72 hours Fasting Lipid Panel: No results found for this basename: CHOL, HDL, LDLCALC, TRIG, CHOLHDL, LDLDIRECT,  in the last 72 hours Thyroid Function Tests: No results found for this basename: TSH, T4TOTAL, FREET3, T3FREE, THYROIDAB,  in the last 72 hours Anemia Panel: No results found for this basename: VITAMINB12, FOLATE, FERRITIN, TIBC, IRON, RETICCTPCT,  in the last 72 hours Coag Panel:   Lab Results  Component Value Date   INR 1.11 09/23/2013   INR 0.92 06/02/2012    RADIOLOGY: Dg Chest 2 View  09/22/2013   CLINICAL DATA:  Shortness of breath, pneumonia  EXAM: CHEST  2 VIEW  COMPARISON:  DG CHEST 1V PORT dated 08/16/2013  FINDINGS: There is no focal parenchymal opacity, pleural effusion, or pneumothorax. Stable cardiomegaly.  There is mild thoracic spine spondylosis.  IMPRESSION: No active cardiopulmonary disease.   Electronically Signed   By: Elige KoHetal  Patel   On: 09/22/2013  19:04   Ct Chest W Contrast  09/23/2013   CLINICAL DATA:  Shortness of breath and cough.  EXAM: CT CHEST WITH CONTRAST  TECHNIQUE: Multidetector CT imaging of the chest was performed during intravenous contrast administration.  CONTRAST:  80mL OMNIPAQUE IOHEXOL 300 MG/ML  SOLN  COMPARISON:  Insert priors  FINDINGS: No pathologically enlarged mediastinal, hilar or axillary lymph nodes. Atherosclerotic calcification of the arterial vasculature including extensive three-vessel involvement of the coronary arteries. Heart is enlarged. No pericardial effusion.  Trace right pleural effusion. Image quality is degraded by respiratory motion. Patchy areas of ground-glass in the apices of both upper lobes, right greater than left. Patchy ground-glass airspace disease in the right lower lobe with consolidation dependently. Minimal volume loss in the left lower lobe with probable mild ground-glass in the left lower lobe  (image 41). Airway is unremarkable.  Incidental imaging of the upper abdomen shows the visualized portions of the liver, adrenal glands, kidneys, spleen, pancreas, stomach and bowel to be grossly unremarkable. No upper abdominal adenopathy. No worrisome lytic or sclerotic lesions. Degenerative changes are seen in the spine.  IMPRESSION: 1. Patchy areas of ground-glass in the upper lobes and lower lobes, suspicious for multilobar pneumonia. Image quality is degraded by respiratory motion. 2. Trace right pleural fluid. 3. Extensive 3 vessel coronary artery calcification.   Electronically Signed   By: Leanna Battles M.D.   On: 09/23/2013 17:08   Dg Chest Port 1 View  09/24/2013   CLINICAL DATA:  Shortness of breath.  EXAM: PORTABLE CHEST - 1 VIEW  COMPARISON:  Chest x-ray and CT scan dated 09/23/2013  FINDINGS: There is persistent cardiomegaly with slight pulmonary vascular congestion. Increased density in the right has improved with some residual density at the right base. This may represent faint pulmonary edema. Left lung is clear.  IMPRESSION: Improved faint infiltrates on the right. I suspect the patient may have slight pulmonary edema.   Electronically Signed   By: Geanie Cooley M.D.   On: 09/24/2013 07:59   Dg Chest Port 1 View  09/23/2013   CLINICAL DATA:  Respiratory failure.  EXAM: PORTABLE CHEST - 1 VIEW  COMPARISON:  Chest x-ray 09/22/2013.  FINDINGS: Mild diffuse peribronchial cuffing throughout the lungs bilaterally. Patchy ill-defined airspace disease throughout the right mid to lower lung. No definite pleural effusions. No cephalization of the pulmonary vasculature. Heart size is mildly enlarged. The patient is rotated to the left on today's exam, resulting in distortion of the mediastinal contours and reduced diagnostic sensitivity and specificity for mediastinal pathology.  IMPRESSION: 1. The appearance the chest suggests bronchitis with multilobar bronchopneumonia in the right lung. 2. Mild  cardiomegaly.   Electronically Signed   By: Trudie Reed M.D.   On: 09/23/2013 06:49   Dg Abd Portable 1v  09/23/2013   CLINICAL DATA:  Projectile vomiting.  EXAM: PORTABLE ABDOMEN - 1 VIEW  COMPARISON:  None.  FINDINGS: There is mild motion artifact. Gas is present in the multiple grossly nondilated loops of small large bowel throughout the abdomen and pelvis. No abnormal soft tissue calcification is identified. Multilevel osteophytosis is present in the visualized thoracic spine and lumbar spine.  IMPRESSION: No evidence of bowel obstruction.   Electronically Signed   By: Sebastian Ache   On: 09/23/2013 22:26    ASSESSMENT AND PLAN:  1. ATRIAL FIB: Secondary to acute pulmonary process. She is now back in NSR.Beta blocker and IV Cardizem were stopped and she is not on PO Cardizem immediate  release. We can later change this to CD  2. ELEVATED TROPONIN: Likely secondary to her acute illness. No evidence of a ACS although she has known CAD (stent and coronary calcification seen on CT). 2D echo showed EF 40-45% with hypokinesis of the mid and distal anteroseptum. This is a new finding since echo in 08/2013 and 06/2012. She has not had any chest pain since admission. Will need evaluation for progression of CAD once her pulmonary process has resolved. Continue ASA. Need to try to get old cath records from [place].  3. HTN: This will be managed in the context of treating the atrial fib. Continue other meds as listed.  4. ACUTE on CHRONIC DIASTOLIC CHF - She is currently on IV Lasix and is net - 5.6L out and she is down 5 lbs.   - continue IV diuretics. Decrease Lasix to 40mg  IV daily .  Check BNP today 5. PNA on antibx        Quintella Reichert, MD  09/26/2013  7:26 AM

## 2013-09-26 NOTE — Progress Notes (Signed)
Inpatient Diabetes Program Recommendations  AACE/ADA: New Consensus Statement on Inpatient Glycemic Control (2013)  Target Ranges:  Prepandial:   less than 140 mg/dL      Peak postprandial:   less than 180 mg/dL (1-2 hours)      Critically ill patients:  140 - 180 mg/dL    Results for Glenda StairsFOSTER, [name] (MRN [id number]) as of 09/26/2013 09:43  Ref. Range 09/25/2013 07:10 09/25/2013 11:58 09/25/2013 17:09 09/25/2013 22:17  Glucose-Capillary Latest Range: 70-99 mg/dL 696230 (H) 295301 (H) 284322 (H) 337 (H)     **Pt having elevated glucose levels. Currently getting Prednisone 40 mg daily.   **Note pt started on Carbohydrate Modified diet on evening of 03/24  **Patient eating 60-100% of meals at present   MD- Please consider one of the following recommendations:  1) Increase Novolog SSI to Moderate scale tid ac + HS (currently getting Sensitive scale coverage)  OR 2) Add Novolog meal coverage- Novolog 4 units tid with meals    Will follow. Ambrose FinlandJeannine Johnston Glenys Snader RN, MSN, CDE Diabetes Coordinator Inpatient Diabetes Program Team Pager: [phone] (8a-10p)

## 2013-09-26 NOTE — Progress Notes (Signed)
Clinical Social Work Department BRIEF PSYCHOSOCIAL ASSESSMENT 09/26/2013  Patient:  Glenda Underwood, Glenda Underwood     Account Number:  [id number]     Admit date:  09/22/2013   Clinical Social Worker:  Venia Minks  Date/Time:  09/26/2013 12:00 M  Referred by:  Physician  Date Referred:  09/26/2013 Referred for  ALF Placement   Other Referral:   Interview type:  Patient Other interview type:    PSYCHOSOCIAL DATA Living Status:  FACILITY Admitted from facility:  [facility] Level of care:  Assisted Living Primary support name:  Gwenyth Bouillon Primary support relationship to patient:  CHILD, ADULT Degree of support available:   good    CURRENT CONCERNS Current Concerns  Post-Acute Placement   Other Concerns:    SOCIAL WORK ASSESSMENT / PLAN CSW met with patient. patient is alert and oriented X3. patient confirms that she is a resident of [facility]. She would like to return there upon discharge but understands that she hasnt worked with physical therapy yet and may possibly need snf placement. She is open to this as an option if needed.   Assessment/plan status:   Other assessment/ plan:   Information/referral to community resources:    PATIENT'S/FAMILY'S RESPONSE TO PLAN OF CARE: patient is calm and cooperative and extremely pleasant. She is happy with either plan of care that is needed. She is very realistic.

## 2013-09-26 NOTE — Progress Notes (Signed)
PATIENT DETAILS Name: Glenda Underwood Age: 80 y.o. Sex: female Date of Birth: 01/09/1933 Admit Date: 09/22/2013 Admitting Physician Edsel PetrinElizabeth L Golding, DO ZOX:[id number],XBJYNPCP:[id number],PETER Homero FellersFRANK, MD    Subjective:  Pt in bed no headache, no chest pain, no cough, improved SOB. No abdominal pain or diarrhea, no focal weakness.    Assessment/Plan:     Acute Hypoxic Resp failure due to HCAP+ Acute on Chr Diastolic and Systolic CHF Ef 45%+COPD exacerbation   HCAP -repeat CXR done on 2/23 am shows Bronchopneumonia, given recent hospitalization will treat as HCAP -c/w Vanco/Levaquin and Azactam   -blood cultures neg so far -remains afebrile, improving leukocytosis improving is non toxic looking.       Acute on Chr Diastolic and Systolic CHF Ef 45% (down from few mths ago) -given acute resp distress this am -c/w Lasix to BID -monitor daily weights, I &O's -Hold Beta blockers given bronchospasm and recurrent acute resp distress - On ACE, Cards following       COPD with acute Exacerbation -never formally diagnosed with COPD-but long time smoker-will need outpatient PFT's -apparently gets deliriuouos with steroids-since clinically improved-we'll rapidly taper prednisone, c/w nebs, antibiotics as above, on ABX, o2 and nebs as needed.      Afib RVR -will place on cardizem CD on 3-26 -placed on Eliquis on 09-25-13 -cards monitoring     Hx of CAD with mild non-STEMI -hx of PCI in 2013 -NSTEMI c/w ASA, Statins, hold metoprolol given bronchospasm, on Heparin gtt per Cards for atrial fibrillation now to be transitioned to Eliquis -initial troponins neg-no chest pain-SOB secondary to above noted issues   -Cards monitoring       HTN -controlled with Clonidine,  Lasix, Cardizem,and Lisinopril -monitor BP and adjust medications accordingly       Dyslipidemia -c/w statins       Hx of Type 2 DM -last A1C-7.2 -appears to be not on any medications as  outpatient -place on SSI while inpatient-on steroids -suspect will benefit from being on metformin on discharge  CBG (last 3)   Recent Labs  09/25/13 1709 09/25/13 2217 09/26/13 0756  GLUCAP 322* 337* 189*         Tobacco Abuse -counseled extensively -unfortunately was smoking till a few days back prior to this admit -does not think she needs transdermal nicotine        DVT Prophylaxis: Prophylactic Eliquis       Code Status: Full code     Family Communication None at bedside    Procedures:  Echo, Ct Chest    CONSULTS: Cards    Time spent  40 minutes-which includes 50% of the time with face-to-face with patient/ family and coordinating care related to the above assessment and plan.    MEDICATIONS: Scheduled Meds: . apixaban  5 mg Oral BID  . aspirin EC  81 mg Oral Daily  . atorvastatin  10 mg Oral q1800  . aztreonam  1 g Intravenous 3 times per day  . cholecalciferol  1,000 Units Oral Daily  . cloNIDine  0.3 mg Oral BID  . diltiazem  30 mg Oral 4 times per day  . docusate sodium  200 mg Oral BID  . DULoxetine  60 mg Oral Daily  . furosemide  40 mg Intravenous Daily  . hydrALAZINE  50 mg Oral QID  . insulin aspart  0-9 Units Subcutaneous TID WC & HS  . ipratropium  0.5 mg Nebulization Once  . levofloxacin (  LEVAQUIN) IV  750 mg Intravenous Q48H  . lisinopril  20 mg Oral Daily  . LORazepam  0.5 mg Oral TID  . Memantine HCl ER  28 mg Oral Daily  . metoCLOPramide (REGLAN) injection  5 mg Intravenous 4 times per day  . pantoprazole  40 mg Oral Daily  . polyethylene glycol  17 g Oral Daily  . potassium chloride  20 mEq Oral Daily  . predniSONE  40 mg Oral Q breakfast  . senna  1 tablet Oral BID  . sodium chloride  3 mL Intravenous Q12H  . traZODone  50 mg Oral QHS,MR X 1  . vancomycin  500 mg Intravenous Q12H   Continuous Infusions: . sodium chloride 20 mL/hr at 09/26/13 0300   PRN Meds:.acetaminophen, acetaminophen, alum & mag  hydroxide-simeth, ipratropium, levalbuterol, LORazepam, morphine injection, ondansetron  Antibiotics: Anti-infectives   Start     Dose/Rate Route Frequency Ordered Stop   09/23/13 1000  vancomycin (VANCOCIN) 500 mg in sodium chloride 0.9 % 100 mL IVPB     500 mg 100 mL/hr over 60 Minutes Intravenous Every 12 hours 09/23/13 0753     09/23/13 0900  levofloxacin (LEVAQUIN) IVPB 750 mg     750 mg 100 mL/hr over 90 Minutes Intravenous Every 48 hours 09/23/13 0735     09/23/13 0900  aztreonam (AZACTAM) 1 g in dextrose 5 % 50 mL IVPB     1 g 100 mL/hr over 30 Minutes Intravenous 3 times per day 09/23/13 0753     09/22/13 2130  aztreonam (AZACTAM) 2 g in dextrose 5 % 50 mL IVPB     2 g 100 mL/hr over 30 Minutes Intravenous  Once 09/22/13 2128 09/22/13 2333   09/22/13 2130  vancomycin (VANCOCIN) IVPB 1000 mg/200 mL premix     1,000 mg 200 mL/hr over 60 Minutes Intravenous  Once 09/22/13 2128 09/23/13 0104       PHYSICAL EXAM: Vital signs in last 24 hours: Filed Vitals:   09/25/13 1538 09/25/13 2056 09/26/13 0226 09/26/13 0514  BP: 118/53 143/60 104/51 106/52  Pulse: 71 83 57 57  Temp: 98.4 F (36.9 C) 98.1 F (36.7 C) 98 F (36.7 C) 97.4 F (36.3 C)  TempSrc: Oral Oral Oral Oral  Resp: 18 16 16 14   Height:      Weight:      SpO2: 96% 99% 100% 99%    Weight change:  Filed Weights   09/23/13 0130 09/23/13 0400 09/25/13 0403  Weight: 70.2 kg (154 lb 12.2 oz) 68.9 kg (151 lb 14.4 oz) 66.6 kg (146 lb 13.2 oz)   Body mass index is 26.85 kg/(m^2).   Gen Exam: Awake and alert with clear speech.  Mildly tachypneic Neck: Supple, No JVD.  Chest: good air entry-still with mild bilat rhonchi-  CVS: S1 S2 Regular, no murmurs.  Abdomen: soft, BS +, non tender, non distended.  Extremities: no edema, lower extremities warm to touch. Neurologic: Non Focal.   Skin: No Rash.   Wounds: N/A.   Intake/Output from previous day:  Intake/Output Summary (Last 24 hours) at 09/26/13 0943 Last  data filed at 09/26/13 0841  Gross per 24 hour  Intake   1940 ml  Output   2725 ml  Net   -785 ml     LAB RESULTS: CBC  Recent Labs Lab 09/22/13 1850 09/23/13 0340 09/24/13 0221 09/25/13 0130 09/26/13 0345  WBC 9.7 14.5* 37.1* 24.4* 18.3*  HGB 12.3 13.2 12.9 11.9* 11.4*  HCT 37.2  40.0 37.7 35.1* 35.0*  PLT 269 231 307 276 240  MCV 83.8 84.6 81.3 83.6 84.3  MCH 27.7 27.9 27.8 28.3 27.5  MCHC 33.1 33.0 34.2 33.9 32.6  RDW 13.9 13.8 13.8 14.4 14.1    Chemistries   Recent Labs Lab 09/22/13 1850 09/23/13 0340 09/23/13 1901 09/24/13 0221 09/25/13 0130 09/26/13 0345  NA 139  --  133* 132* 136* 136*  K 3.8  --  2.7* 3.9 4.2 4.5  CL 102  --  90* 93* 98 102  CO2 21  --  19 22 22 21   GLUCOSE 137*  --  230* 250* 237* 179*  BUN 12  --  23 23 34* 41*  CREATININE 0.81 1.01 1.00 1.00 1.04 1.10  CALCIUM 9.0  --  9.4 8.6 8.6 8.4  MG  --   --  1.7  --   --   --     CBG:  Recent Labs Lab 09/25/13 0710 09/25/13 1158 09/25/13 1709 09/25/13 2217 09/26/13 0756  GLUCAP 230* 301* 322* 337* 189*    GFR Estimated Creatinine Clearance: 37.1 ml/min (by C-G formula based on Cr of 1.1).  Coagulation profile  Recent Labs Lab 09/23/13 2143  INR 1.11    Cardiac Enzymes  Recent Labs Lab 09/23/13 1901 09/24/13 0221 09/24/13 0705  TROPONINI 0.70* 0.85* 0.69*    No components found with this basename: POCBNP,  No results found for this basename: DDIMER,  in the last 72 hours No results found for this basename: HGBA1C,  in the last 72 hours No results found for this basename: CHOL, HDL, LDLCALC, TRIG, CHOLHDL, LDLDIRECT,  in the last 72 hours No results found for this basename: TSH, T4TOTAL, FREET3, T3FREE, THYROIDAB,  in the last 72 hours No results found for this basename: VITAMINB12, FOLATE, FERRITIN, TIBC, IRON, RETICCTPCT,  in the last 72 hours No results found for this basename: LIPASE, AMYLASE,  in the last 72 hours  Urine Studies No results found for this  basename: UACOL, UAPR, USPG, UPH, UTP, UGL, UKET, UBIL, UHGB, UNIT, UROB, ULEU, UEPI, UWBC, URBC, UBAC, CAST, CRYS, UCOM, BILUA,  in the last 72 hours  MICROBIOLOGY: Recent Results (from the past 240 hour(s))  MRSA PCR SCREENING     Status: None   Collection Time    09/23/13  1:28 AM      Result Value Ref Range Status   MRSA by PCR NEGATIVE  NEGATIVE Final   Comment:            The GeneXpert MRSA Assay (FDA     approved for NASAL specimens     only), is one component of a     comprehensive MRSA colonization     surveillance program. It is not     intended to diagnose MRSA     infection nor to guide or     monitor treatment for     MRSA infections.  CULTURE, BLOOD (ROUTINE X 2)     Status: None   Collection Time    09/23/13 10:00 AM      Result Value Ref Range Status   Specimen Description BLOOD LEFT ARM   Final   Special Requests BOTTLES DRAWN AEROBIC AND ANAEROBIC [facility]   Final   Culture  Setup Time     Final   Value: 09/23/2013 13:20     Performed at [facility]   Culture     Final   Value:  BLOOD CULTURE RECEIVED NO GROWTH TO DATE CULTURE WILL BE HELD FOR 5 DAYS BEFORE ISSUING A FINAL NEGATIVE REPORT     Performed at [facility]   Report Status PENDING   Incomplete  CULTURE, BLOOD (ROUTINE X 2)     Status: None   Collection Time    09/23/13 10:10 AM      Result Value Ref Range Status   Specimen Description BLOOD LEFT HAND   Final   Special Requests BOTTLES DRAWN AEROBIC AND ANAEROBIC 4CC   Final   Culture  Setup Time     Final   Value: 09/23/2013 13:21     Performed at [facility]   Culture     Final   Value:        BLOOD CULTURE RECEIVED NO GROWTH TO DATE CULTURE WILL BE HELD FOR 5 DAYS BEFORE ISSUING A FINAL NEGATIVE REPORT     Performed at [facility]   Report Status PENDING   Incomplete    RADIOLOGY STUDIES/RESULTS: Dg Chest 2 View  09/22/2013   CLINICAL DATA:  Shortness of breath, pneumonia  EXAM: CHEST  2 VIEW   COMPARISON:  DG CHEST 1V PORT dated 08/16/2013  FINDINGS: There is no focal parenchymal opacity, pleural effusion, or pneumothorax. Stable cardiomegaly.  There is mild thoracic spine spondylosis.  IMPRESSION: No active cardiopulmonary disease.   Electronically Signed   By: Elige Ko   On: 09/22/2013 19:04   Dg Chest Port 1 View  09/23/2013   CLINICAL DATA:  Respiratory failure.  EXAM: PORTABLE CHEST - 1 VIEW  COMPARISON:  Chest x-ray 09/22/2013.  FINDINGS: Mild diffuse peribronchial cuffing throughout the lungs bilaterally. Patchy ill-defined airspace disease throughout the right mid to lower lung. No definite pleural effusions. No cephalization of the pulmonary vasculature. Heart size is mildly enlarged. The patient is rotated to the left on today's exam, resulting in distortion of the mediastinal contours and reduced diagnostic sensitivity and specificity for mediastinal pathology.  IMPRESSION: 1. The appearance the chest suggests bronchitis with multilobar bronchopneumonia in the right lung. 2. Mild cardiomegaly.   Electronically Signed   By: Trudie Reed M.D.   On: 09/23/2013 06:49    Leroy Sea, MD  [facility] Pager:[phone]  If 7PM-7AM, please contact night-coverage [email or web address] Password [facility] 09/26/2013, 9:43 AM   LOS: 4 days

## 2013-09-26 NOTE — Care Management Note (Addendum)
    Page 1 of 2   10/01/2013     10:55:44 AM   CARE MANAGEMENT NOTE 10/01/2013  Patient:  Nicholes StairsFOSTER,Glenda A   Account Number:  [id number]  Date Initiated:  09/26/2013  Documentation initiated by:  Lanier ClamMAHABIR,Monaca Wadas  Subjective/Objective Assessment:   80 Y/O F ADMITTED W/CHF.ARF,PNA,AFIB.     Action/Plan:   FROM [facility]-AL[facility].   Anticipated DC Date:  10/01/2013   Anticipated DC Plan:  HOME W HOME HEALTH SERVICES      DC Planning Services  CM consult      Choice offered to / List presented to:  C-1 Patient        HH arranged  HH-1 RN  HH-2 PT      HH agency  [facility]   Status of service:  Completed, signed off Medicare Important Message given?   (If response is "NO", the following Medicare IM given date fields will be blank) Date Medicare IM given:   Date Additional Medicare IM given:    Discharge Disposition:  HOME W HOME HEALTH SERVICES  Per UR Regulation:  Reviewed for med. necessity/level of care/duration of stay  If discussed at Long Length of Stay Meetings, dates discussed:   10/01/2013    Comments:  10/01/13 Justis Closser RN,BSN NCM [phone] D/C BACK TO ALF-HHRN/PT-[facility],TC REP MARY AWARE OF D/C & HHC ORDERS.  09/30/13 Shynice Sigel RN,BSN NCM [phone] FOR CARDIAC CATH @ [facility] TODAY.PT-HH.TC [facility] [phone] AWARE OF HHPT-THEY USE [facility]-TC [facility] REP AWARE OF REFERRAL & FOLLOWING-HAS ACCESS TO [facility].AWAIT FINAL HHC ORDERS.  09/26/13 Jaymian Bogart RN,BSN NCM [phone] TRANSFER FROM SDU.AWAIT PT/OT RECOMMENDATIONS.

## 2013-09-27 ENCOUNTER — Inpatient Hospital Stay (HOSPITAL_COMMUNITY): Payer: Medicare Other

## 2013-09-27 LAB — PROCALCITONIN: PROCALCITONIN: 0.46 ng/mL

## 2013-09-27 LAB — GLUCOSE, CAPILLARY
Glucose-Capillary: 155 mg/dL — ABNORMAL HIGH (ref 70–99)
Glucose-Capillary: 320 mg/dL — ABNORMAL HIGH (ref 70–99)
Glucose-Capillary: 319 mg/dL — ABNORMAL HIGH (ref 70–99)

## 2013-09-27 LAB — CBC
HCT: 35.6 % — ABNORMAL LOW (ref 36.0–46.0)
Hemoglobin: 12 g/dL (ref 12.0–15.0)
MCH: 28.2 pg (ref 26.0–34.0)
MCHC: 33.7 g/dL (ref 30.0–36.0)
MCV: 83.6 fL (ref 78.0–100.0)
PLATELETS: 255 10*3/uL (ref 150–400)
RBC: 4.26 MIL/uL (ref 3.87–5.11)
RDW: 13.7 % (ref 11.5–15.5)
WBC: 13.7 10*3/uL — AB (ref 4.0–10.5)

## 2013-09-27 MED ORDER — TRAMADOL HCL 50 MG PO TABS
50.0000 mg | ORAL_TABLET | Freq: Four times a day (QID) | ORAL | Status: DC | PRN
  Administered 2013-09-27 – 2013-09-28 (×2): 50 mg via ORAL
  Filled 2013-09-27 (×2): qty 1

## 2013-09-27 MED ORDER — FUROSEMIDE 10 MG/ML IJ SOLN
20.0000 mg | Freq: Once | INTRAMUSCULAR | Status: AC
  Administered 2013-09-27: 20 mg via INTRAVENOUS
  Filled 2013-09-27: qty 2

## 2013-09-27 MED ORDER — PREDNISONE 10 MG PO TABS
10.0000 mg | ORAL_TABLET | Freq: Every day | ORAL | Status: DC
  Administered 2013-09-28: 10 mg via ORAL
  Filled 2013-09-27 (×2): qty 1

## 2013-09-27 NOTE — Progress Notes (Signed)
ANTIBIOTIC CONSULT NOTE - FOLLOW UP  Pharmacy Consult for vancomycin/aztreonam Indication: pneumonia  Allergies  Allergen Reactions  . Penicillins Anaphylaxis    Patient Measurements: Height: 5\' 2"  (157.5 cm) Weight: 146 lb 13.2 oz (66.6 kg) IBW/kg (Calculated) : 50.1 Adjusted Body Weight:   Vital Signs: Temp: 97.9 F (36.6 C) (03/27 0447) Temp src: Oral (03/27 0447) BP: 142/57 mmHg (03/27 0447) Pulse Rate: 66 (03/27 0447) Intake/Output from previous day: 03/26 0701 - 03/27 0700 In: 950 [P.O.:600; IV Piggyback:350] Out: 1225 [Urine:1225] Intake/Output from this shift:    Labs:  Recent Labs  09/25/13 0130 09/26/13 0345 09/27/13 0336  WBC 24.4* 18.3* 13.7*  HGB 11.9* 11.4* 12.0  PLT 276 240 255  CREATININE 1.04 1.10  --    Estimated Creatinine Clearance: 37.1 ml/min (by C-G formula based on Cr of 1.1).  Recent Labs  09/25/13 1039  VANCOTROUGH 15.6     Microbiology: Recent Results (from the past 720 hour(s))  MRSA PCR SCREENING     Status: None   Collection Time    09/23/13  1:28 AM      Result Value Ref Range Status   MRSA by PCR NEGATIVE  NEGATIVE Final   Comment:            The GeneXpert MRSA Assay (FDA     approved for NASAL specimens     only), is one component of a     comprehensive MRSA colonization     surveillance program. It is not     intended to diagnose MRSA     infection nor to guide or     monitor treatment for     MRSA infections.  CULTURE, BLOOD (ROUTINE X 2)     Status: None   Collection Time    09/23/13 10:00 AM      Result Value Ref Range Status   Specimen Description BLOOD LEFT ARM   Final   Special Requests BOTTLES DRAWN AEROBIC AND ANAEROBIC [facility]   Final   Culture  Setup Time     Final   Value: 09/23/2013 13:20     [facility]formed [facility]   Culture     Final   Value:        BLOOD CULTURE RECEIVED NO GROWTH TO DATE CULTURE WILL BE HELD FOR 5 DAYS BEFORE ISSUING A FINAL NEGATIVE REPORT     [facility]formed [facility]   Report Status PENDING   Incomplete  CULTURE, BLOOD (ROUTINE X 2)     Status: None   Collection Time    09/23/13 10:10 AM      Result Value Ref Range Status   Specimen Description BLOOD LEFT HAND   Final   Special Requests BOTTLES DRAWN AEROBIC AND ANAEROBIC 4CC   Final   Culture  Setup Time     Final   Value: 09/23/2013 13:21     [facility]formed at [facility]   Culture     Final   Value:        BLOOD CULTURE RECEIVED NO GROWTH TO DATE CULTURE WILL BE HELD FOR 5 DAYS BEFORE ISSUING A FINAL NEGATIVE REPORT     Performed at [facility]   Report Status PENDING   Incomplete    Anti-infectives   Start     Dose/Rate Route Frequency Ordered Stop   09/23/13 1000  vancomycin (VANCOCIN) 500 mg in sodium chloride 0.9 % 100 mL IVPB     500 mg 100 mL/hr  over 60 Minutes Intravenous Every 12 hours 09/23/13 0753     09/23/13 0900  levofloxacin (LEVAQUIN) IVPB 750 mg     750 mg 100 mL/hr over 90 Minutes Intravenous Every 48 hours 09/23/13 0735     09/23/13 0900  aztreonam (AZACTAM) 1 g in dextrose 5 % 50 mL IVPB     1 g 100 mL/hr over 30 Minutes Intravenous 3 times per day 09/23/13 0753     09/22/13 2130  aztreonam (AZACTAM) 2 g in dextrose 5 % 50 mL IVPB     2 g 100 mL/hr over 30 Minutes Intravenous  Once 09/22/13 2128 09/22/13 2333   09/22/13 2130  vancomycin (VANCOCIN) IVPB 1000 mg/200 mL premix     1,000 mg 200 mL/hr over 60 Minutes Intravenous  Once 09/22/13 2128 09/23/13 0104      Assessment: 81 YOF presenting with dyspnea from ALF. Given one-time doses of vancomycin and aztreonam in ED. Rounding physician states he wishes to continue vancomycin, aztreonam, and levofloxacin.   3/23 >>vancomycin >> 3/23 >>aztreonam >>  3/23 >> levofloxacin >>  Tmax: afeb WBCs: improving, today 13.7 (on solu-medrol --> prednisone) Renal:SCr =1.1 for est CrCl =8ml/min (C-G) and 25ml/min (N), excellent UOP  3/23 blood: NGTD  Drug level / dose changes info: 3/25: 1030 VT =  15.12mcg/ml on 500mg  IV q12h  Goal of Therapy:  Vancomycin trough level 15-20 mcg/ml  Plan:   Continue vancomycin 500mg  IV q12h as trough within goal range  Coninue aztreonam and levofloxacin as ordered - doses remain appropriate for renal function  F/u duration of therapy and narrowing of antibiotics  Haynes Hoehn, PharmD, BCPS 09/27/2013, 7:37 AM  Pager: [phone]

## 2013-09-27 NOTE — Progress Notes (Signed)
SUBJECTIVE:  Feels breathing has improved  OBJECTIVE:   Vitals:   Filed Vitals:   09/26/13 2140 09/26/13 2244 09/27/13 0201 09/27/13 0447  BP: 153/69 134/52 129/59 142/57  Pulse: 74  59 66  Temp: 98 F (36.7 C)  97.9 F (36.6 C) 97.9 F (36.6 C)  TempSrc: Oral  Oral Oral  Resp: 18  16 20   Height:      Weight:      SpO2: 97%  100% 99%   I&O's:   Intake/Output Summary (Last 24 hours) at 09/27/13 1610 Last data filed at 09/27/13 0601  Gross per 24 hour  Intake    950 ml  Output   1225 ml  Net   -275 ml   TELEMETRY: Reviewed telemetry pt in NSR     PHYSICAL EXAM General: Well developed, well nourished, in no acute distress Head: Eyes PERRLA, No xanthomas.   Normal cephalic and atramatic  Lungs:   Scattered wheezes and rhonchi. Heart:   HRRR S1 S2 Pulses are 2+ & equal. Abdomen: Bowel sounds are positive, abdomen soft and non-tender without masses Extremities:   No clubbing, cyanosis or edema.  DP +1 Neuro: Alert and oriented X 3. Psych:  Good affect, responds appropriately   LABS: Basic Metabolic Panel:  Recent Labs  96/04/54 0130 09/26/13 0345  NA 136* 136*  K 4.2 4.5  CL 98 102  CO2 22 21  GLUCOSE 237* 179*  BUN 34* 41*  CREATININE 1.04 1.10  CALCIUM 8.6 8.4   Liver Function Tests: No results found for this basename: AST, ALT, ALKPHOS, BILITOT, PROT, ALBUMIN,  in the last 72 hours No results found for this basename: LIPASE, AMYLASE,  in the last 72 hours CBC:  Recent Labs  09/26/13 0345 09/27/13 0336  WBC 18.3* 13.7*  HGB 11.4* 12.0  HCT 35.0* 35.6*  MCV 84.3 83.6  PLT 240 255   Cardiac Enzymes: No results found for this basename: CKTOTAL, CKMB, CKMBINDEX, TROPONINI,  in the last 72 hours BNP: No components found with this basename: POCBNP,  D-Dimer: No results found for this basename: DDIMER,  in the last 72 hours Hemoglobin A1C: No results found for this basename: HGBA1C,  in the last 72 hours Fasting Lipid Panel: No results found  for this basename: CHOL, HDL, LDLCALC, TRIG, CHOLHDL, LDLDIRECT,  in the last 72 hours Thyroid Function Tests: No results found for this basename: TSH, T4TOTAL, FREET3, T3FREE, THYROIDAB,  in the last 72 hours Anemia Panel: No results found for this basename: VITAMINB12, FOLATE, FERRITIN, TIBC, IRON, RETICCTPCT,  in the last 72 hours Coag Panel:   Lab Results  Component Value Date   INR 1.11 09/23/2013   INR 0.92 06/02/2012    RADIOLOGY: Dg Chest 2 View  09/22/2013   CLINICAL DATA:  Shortness of breath, pneumonia  EXAM: CHEST  2 VIEW  COMPARISON:  DG CHEST 1V PORT dated 08/16/2013  FINDINGS: There is no focal parenchymal opacity, pleural effusion, or pneumothorax. Stable cardiomegaly.  There is mild thoracic spine spondylosis.  IMPRESSION: No active cardiopulmonary disease.   Electronically Signed   By: Elige Ko   On: 09/22/2013 19:04   Ct Chest W Contrast  09/23/2013   CLINICAL DATA:  Shortness of breath and cough.  EXAM: CT CHEST WITH CONTRAST  TECHNIQUE: Multidetector CT imaging of the chest was performed during intravenous contrast administration.  CONTRAST:  80mL OMNIPAQUE IOHEXOL 300 MG/ML  SOLN  COMPARISON:  Insert priors  FINDINGS: No pathologically enlarged mediastinal, hilar  or axillary lymph nodes. Atherosclerotic calcification of the arterial vasculature including extensive three-vessel involvement of the coronary arteries. Heart is enlarged. No pericardial effusion.  Trace right pleural effusion. Image quality is degraded by respiratory motion. Patchy areas of ground-glass in the apices of both upper lobes, right greater than left. Patchy ground-glass airspace disease in the right lower lobe with consolidation dependently. Minimal volume loss in the left lower lobe with probable mild ground-glass in the left lower lobe (image 41). Airway is unremarkable.  Incidental imaging of the upper abdomen shows the visualized portions of the liver, adrenal glands, kidneys, spleen, pancreas,  stomach and bowel to be grossly unremarkable. No upper abdominal adenopathy. No worrisome lytic or sclerotic lesions. Degenerative changes are seen in the spine.  IMPRESSION: 1. Patchy areas of ground-glass in the upper lobes and lower lobes, suspicious for multilobar pneumonia. Image quality is degraded by respiratory motion. 2. Trace right pleural fluid. 3. Extensive 3 vessel coronary artery calcification.   Electronically Signed   By: Leanna Battles M.D.   On: 09/23/2013 17:08   Dg Chest Port 1 View  09/24/2013   CLINICAL DATA:  Shortness of breath.  EXAM: PORTABLE CHEST - 1 VIEW  COMPARISON:  Chest x-ray and CT scan dated 09/23/2013  FINDINGS: There is persistent cardiomegaly with slight pulmonary vascular congestion. Increased density in the right has improved with some residual density at the right base. This may represent faint pulmonary edema. Left lung is clear.  IMPRESSION: Improved faint infiltrates on the right. I suspect the patient may have slight pulmonary edema.   Electronically Signed   By: Geanie Cooley M.D.   On: 09/24/2013 07:59   Dg Chest Port 1 View  09/23/2013   CLINICAL DATA:  Respiratory failure.  EXAM: PORTABLE CHEST - 1 VIEW  COMPARISON:  Chest x-ray 09/22/2013.  FINDINGS: Mild diffuse peribronchial cuffing throughout the lungs bilaterally. Patchy ill-defined airspace disease throughout the right mid to lower lung. No definite pleural effusions. No cephalization of the pulmonary vasculature. Heart size is mildly enlarged. The patient is rotated to the left on today's exam, resulting in distortion of the mediastinal contours and reduced diagnostic sensitivity and specificity for mediastinal pathology.  IMPRESSION: 1. The appearance the chest suggests bronchitis with multilobar bronchopneumonia in the right lung. 2. Mild cardiomegaly.   Electronically Signed   By: Trudie Reed M.D.   On: 09/23/2013 06:49   Dg Abd Portable 1v  09/23/2013   CLINICAL DATA:  Projectile vomiting.   EXAM: PORTABLE ABDOMEN - 1 VIEW  COMPARISON:  None.  FINDINGS: There is mild motion artifact. Gas is present in the multiple grossly nondilated loops of small large bowel throughout the abdomen and pelvis. No abnormal soft tissue calcification is identified. Multilevel osteophytosis is present in the visualized thoracic spine and lumbar spine.  IMPRESSION: No evidence of bowel obstruction.   Electronically Signed   By: Sebastian Ache   On: 09/23/2013 22:26    ASSESSMENT AND PLAN:  1. ATRIAL FIB: Secondary to acute pulmonary process. She is now back in NSR. Beta blocker and IV Cardizem were stopped and she is not on PO Cardizem immediate release. We can later change this to CD.  This is apparently her first episode of afib which was in the setting of acute illness.  Her CHADs2Vasc is 4 but given she has not had any episodes prior to this and this episode was in the setting of an acute illness, not sure that long term anticoagulation is indicated  unless she has another episode.  Consider outpt event monitor to assess for silent PAF. 2. ELEVATED TROPONIN: Likely secondary to her acute illness. No evidence of a ACS although she has known CAD (stent and coronary calcification seen on CT). 2D echo showed EF 40-45% with hypokinesis of the mid and distal anteroseptum. This is a new finding since echo in 08/2013 and 06/2012. She has not had any chest pain since admission. Will need evaluation for progression of CAD once her pulmonary process has resolved. Continue ASA. [place]  3. HTN: This will be managed in the context of treating the atrial fib. Continue other meds as listed.  4. ACUTE on CHRONIC DIASTOLIC CHF - She is currently on IV Lasix and is net - 6L out - weights have been incomplete. BNP trending downward but still elevated. Will continue Lasix 40mg  IV daily.  5. PNA on antibx       Glenda ReichertURNER,Glenda Halley R, MD  09/27/2013  8:07 AM

## 2013-09-27 NOTE — Progress Notes (Signed)
PATIENT DETAILS Name: Glenda StairsHilda A Underwood Age: 80 y.o. Sex: female Date of Birth: 08/30/1932 Admit Date: 09/22/2013 Admitting Physician Edsel PetrinElizabeth L Golding, DO AVW:[id number],NFAOZPCP:[id number],PETER Homero FellersFRANK, MD    Subjective:  Pt in bed no headache, no chest pain, no cough, improved SOB. No abdominal pain or diarrhea, no focal weakness.    Assessment/Plan:     Acute Hypoxic Resp failure due to HCAP+ Acute on Chr Diastolic and Systolic CHF Ef 45%+COPD exacerbation   HCAP -repeat CXR done on 2/23 am shows Bronchopneumonia, given recent hospitalization will treat as HCAP -Neatly much improved after empiric antibiotics, we'll taper to only Levaquin IV on 09/27/2013, repeat 2 view chest x-ray today. -Leucocytosis much improved, afebrile, cultures negative to date.       Acute on Chr Diastolic and Systolic CHF Ef 45% (down from few mths ago) -much improved -Lasix dose adjusted as BUN rising close to compensated now -monitor daily weights, I &O's -Hold Beta blockers given bronchospasm and recurrent acute resp distress - On ACE, Cards following       COPD with acute Exacerbation -never formally diagnosed with COPD-but long time smoker-will need outpatient PFT's -apparently gets deliriuouos with steroids-since clinically improved-we'll rapidly taper prednisone, c/w nebs, antibiotics as above, on ABX, o2 and nebs as needed. May need home oxygen.      Afib RVR -will place on cardizem CD on 3-26 -No anticoagulation per cardiology -cards monitoring     Hx of CAD with mild non-STEMI -hx of PCI in 2013 -NSTEMI c/w ASA, Statins, hold metoprolol given bronchospasm, no anticoagulation per cardiology. -initial troponins neg-no chest pain-SOB secondary to above noted issues   -Cards monitoring, left heart catheterization to be done on Monday which will be 09/30/2013       HTN -controlled with Clonidine,  Lasix, Cardizem,and Lisinopril -monitor BP and adjust medications  accordingly       Dyslipidemia -c/w statins       Hx of Type 2 DM -last A1C-7.2 -appears to be not on any medications as outpatient -place on SSI while inpatient-on steroids -suspect will benefit from being on metformin on discharge  CBG (last 3)   Recent Labs  09/26/13 1659 09/26/13 2207 09/27/13 0819  GLUCAP 298* 293* 155*         Tobacco Abuse -counseled extensively -unfortunately was smoking till a few days back prior to this admit -does not think she needs transdermal nicotine        DVT Prophylaxis: Prophylactic Heparin       Code Status: Full code     Family Communication None at bedside    Procedures:  Echo, Ct Chest    CONSULTS: Cards    Time spent  40 minutes-which includes 50% of the time with face-to-face with patient/ family and coordinating care related to the above assessment and plan.    MEDICATIONS: Scheduled Meds: . aspirin EC  81 mg Oral Daily  . atorvastatin  10 mg Oral q1800  . aztreonam  1 g Intravenous 3 times per day  . cholecalciferol  1,000 Units Oral Daily  . cloNIDine  0.3 mg Oral BID  . diltiazem  120 mg Oral Daily  . docusate sodium  200 mg Oral BID  . DULoxetine  60 mg Oral Daily  . furosemide  40 mg Oral Daily  . heparin subcutaneous  5,000 Units Subcutaneous 3 times per day  . hydrALAZINE  50 mg Oral QID  . insulin aspart  0-9 Units  Subcutaneous TID WC & HS  . ipratropium  0.5 mg Nebulization Once  . levofloxacin (LEVAQUIN) IV  750 mg Intravenous Q48H  . lisinopril  20 mg Oral Daily  . LORazepam  0.5 mg Oral TID  . Memantine HCl ER  28 mg Oral Daily  . metoCLOPramide (REGLAN) injection  5 mg Intravenous 4 times per day  . pantoprazole  40 mg Oral Daily  . polyethylene glycol  17 g Oral Daily  . potassium chloride  20 mEq Oral Daily  . predniSONE  20 mg Oral Q breakfast  . senna  1 tablet Oral BID  . sodium chloride  3 mL Intravenous Q12H  . traZODone  50 mg Oral QHS,MR X 1  .  vancomycin  500 mg Intravenous Q12H   Continuous Infusions: . sodium chloride 20 mL/hr at 09/26/13 0300   PRN Meds:.acetaminophen, acetaminophen, alum & mag hydroxide-simeth, ipratropium, levalbuterol, LORazepam, morphine injection, ondansetron  Antibiotics: Anti-infectives   Start     Dose/Rate Route Frequency Ordered Stop   09/23/13 1000  vancomycin (VANCOCIN) 500 mg in sodium chloride 0.9 % 100 mL IVPB     500 mg 100 mL/hr over 60 Minutes Intravenous Every 12 hours 09/23/13 0753     09/23/13 0900  levofloxacin (LEVAQUIN) IVPB 750 mg     750 mg 100 mL/hr over 90 Minutes Intravenous Every 48 hours 09/23/13 0735     09/23/13 0900  aztreonam (AZACTAM) 1 g in dextrose 5 % 50 mL IVPB     1 g 100 mL/hr over 30 Minutes Intravenous 3 times per day 09/23/13 0753     09/22/13 2130  aztreonam (AZACTAM) 2 g in dextrose 5 % 50 mL IVPB     2 g 100 mL/hr over 30 Minutes Intravenous  Once 09/22/13 2128 09/22/13 2333   09/22/13 2130  vancomycin (VANCOCIN) IVPB 1000 mg/200 mL premix     1,000 mg 200 mL/hr over 60 Minutes Intravenous  Once 09/22/13 2128 09/23/13 0104       PHYSICAL EXAM: Vital signs in last 24 hours: Filed Vitals:   09/26/13 2244 09/27/13 0201 09/27/13 0447 09/27/13 0824  BP: 134/52 129/59 142/57 149/72  Pulse:  59 66 72  Temp:  97.9 F (36.6 C) 97.9 F (36.6 C) 97.9 F (36.6 C)  TempSrc:  Oral Oral Oral  Resp:  16 20 19   Height:      Weight:      SpO2:  100% 99% 98%    Weight change:  Filed Weights   09/23/13 0130 09/23/13 0400 09/25/13 0403  Weight: 70.2 kg (154 lb 12.2 oz) 68.9 kg (151 lb 14.4 oz) 66.6 kg (146 lb 13.2 oz)   Body mass index is 26.85 kg/(m^2).   Gen Exam: Awake and alert with clear speech.  Mildly tachypneic Neck: Supple, No JVD.  Chest: good air entry-still with mild bilat rhonchi-  CVS: S1 S2 Regular, no murmurs.  Abdomen: soft, BS +, non tender, non distended.  Extremities: no edema, lower extremities warm to touch. Neurologic: Non  Focal.   Skin: No Rash.   Wounds: N/A.   Intake/Output from previous day:  Intake/Output Summary (Last 24 hours) at 09/27/13 0903 Last data filed at 09/27/13 0601  Gross per 24 hour  Intake    830 ml  Output    950 ml  Net   -120 ml     LAB RESULTS: CBC  Recent Labs Lab 09/23/13 0340 09/24/13 0221 09/25/13 0130 09/26/13 0345 09/27/13 0336  WBC  14.5* 37.1* 24.4* 18.3* 13.7*  HGB 13.2 12.9 11.9* 11.4* 12.0  HCT 40.0 37.7 35.1* 35.0* 35.6*  PLT 231 307 276 240 255  MCV 84.6 81.3 83.6 84.3 83.6  MCH 27.9 27.8 28.3 27.5 28.2  MCHC 33.0 34.2 33.9 32.6 33.7  RDW 13.8 13.8 14.4 14.1 13.7    Chemistries   Recent Labs Lab 09/22/13 1850 09/23/13 0340 09/23/13 1901 09/24/13 0221 09/25/13 0130 09/26/13 0345  NA 139  --  133* 132* 136* 136*  K 3.8  --  2.7* 3.9 4.2 4.5  CL 102  --  90* 93* 98 102  CO2 21  --  19 22 22 21   GLUCOSE 137*  --  230* 250* 237* 179*  BUN 12  --  23 23 34* 41*  CREATININE 0.81 1.01 1.00 1.00 1.04 1.10  CALCIUM 9.0  --  9.4 8.6 8.6 8.4  MG  --   --  1.7  --   --   --     CBG:  Recent Labs Lab 09/26/13 0756 09/26/13 1212 09/26/13 1659 09/26/13 2207 09/27/13 0819  GLUCAP 189* 349* 298* 293* 155*    GFR Estimated Creatinine Clearance: 37.1 ml/min (by C-G formula based on Cr of 1.1).  Coagulation profile  Recent Labs Lab 09/23/13 2143  INR 1.11    Cardiac Enzymes  Recent Labs Lab 09/23/13 1901 09/24/13 0221 09/24/13 0705  TROPONINI 0.70* 0.85* 0.69*    No components found with this basename: POCBNP,  No results found for this basename: DDIMER,  in the last 72 hours No results found for this basename: HGBA1C,  in the last 72 hours No results found for this basename: CHOL, HDL, LDLCALC, TRIG, CHOLHDL, LDLDIRECT,  in the last 72 hours No results found for this basename: TSH, T4TOTAL, FREET3, T3FREE, THYROIDAB,  in the last 72 hours No results found for this basename: VITAMINB12, FOLATE, FERRITIN, TIBC, IRON, RETICCTPCT,   in the last 72 hours No results found for this basename: LIPASE, AMYLASE,  in the last 72 hours  Urine Studies No results found for this basename: UACOL, UAPR, USPG, UPH, UTP, UGL, UKET, UBIL, UHGB, UNIT, UROB, ULEU, UEPI, UWBC, URBC, UBAC, CAST, CRYS, UCOM, BILUA,  in the last 72 hours  MICROBIOLOGY: Recent Results (from the past 240 hour(s))  MRSA PCR SCREENING     Status: None   Collection Time    09/23/13  1:28 AM      Result Value Ref Range Status   MRSA by PCR NEGATIVE  NEGATIVE Final   Comment:            The GeneXpert MRSA Assay (FDA     approved for NASAL specimens     only), is one component of a     comprehensive MRSA colonization     surveillance program. It is not     intended to diagnose MRSA     infection nor to guide or     monitor treatment for     MRSA infections.  CULTURE, BLOOD (ROUTINE X 2)     Status: None   Collection Time    09/23/13 10:00 AM      Result Value Ref Range Status   Specimen Description BLOOD LEFT ARM   Final   Special Requests BOTTLES DRAWN AEROBIC AND ANAEROBIC [facility]   Final   Culture  Setup Time     Final   Value: 09/23/2013 13:20     Performed at [facility]  Final   Value:        BLOOD CULTURE RECEIVED NO GROWTH TO DATE CULTURE WILL BE HELD FOR 5 DAYS BEFORE ISSUING A FINAL NEGATIVE REPORT     Performed at [facility]   Report Status PENDING   Incomplete  CULTURE, BLOOD (ROUTINE X 2)     Status: None   Collection Time    09/23/13 10:10 AM      Result Value Ref Range Status   Specimen Description BLOOD LEFT HAND   Final   Special Requests BOTTLES DRAWN AEROBIC AND ANAEROBIC 4CC   Final   Culture  Setup Time     Final   Value: 09/23/2013 13:21     Performed at [facility]   Culture     Final   Value:        BLOOD CULTURE RECEIVED NO GROWTH TO DATE CULTURE WILL BE HELD FOR 5 DAYS BEFORE ISSUING A FINAL NEGATIVE REPORT     Performed at [facility]   Report Status PENDING   Incomplete      RADIOLOGY STUDIES/RESULTS: Dg Chest 2 View  09/22/2013   CLINICAL DATA:  Shortness of breath, pneumonia  EXAM: CHEST  2 VIEW  COMPARISON:  DG CHEST 1V PORT dated 08/16/2013  FINDINGS: There is no focal parenchymal opacity, pleural effusion, or pneumothorax. Stable cardiomegaly.  There is mild thoracic spine spondylosis.  IMPRESSION: No active cardiopulmonary disease.   Electronically Signed   By: Elige Ko   On: 09/22/2013 19:04   Dg Chest Port 1 View  09/23/2013   CLINICAL DATA:  Respiratory failure.  EXAM: PORTABLE CHEST - 1 VIEW  COMPARISON:  Chest x-ray 09/22/2013.  FINDINGS: Mild diffuse peribronchial cuffing throughout the lungs bilaterally. Patchy ill-defined airspace disease throughout the right mid to lower lung. No definite pleural effusions. No cephalization of the pulmonary vasculature. Heart size is mildly enlarged. The patient is rotated to the left on today's exam, resulting in distortion of the mediastinal contours and reduced diagnostic sensitivity and specificity for mediastinal pathology.  IMPRESSION: 1. The appearance the chest suggests bronchitis with multilobar bronchopneumonia in the right lung. 2. Mild cardiomegaly.   Electronically Signed   By: Trudie Reed M.D.   On: 09/23/2013 06:49    Leroy Sea, MD  [facility] Pager:[phone]  If 7PM-7AM, please contact night-coverage [email or web address] Password [email or web address] 09/27/2013, 9:03 AM   LOS: 5 days

## 2013-09-27 NOTE — Progress Notes (Signed)
Inpatient Diabetes Program Recommendations  AACE/ADA: New Consensus Statement on Inpatient Glycemic Control (2013)  Target Ranges:  Prepandial:   less than 140 mg/dL      Peak postprandial:   less than 180 mg/dL (1-2 hours)      Critically ill patients:  140 - 180 mg/dL    Results for Glenda Underwood, Glenda A (MRN [id number]) as of 09/27/2013 08:32  Ref. Range 09/26/2013 07:56 09/26/2013 12:12 09/26/2013 16:59 09/26/2013 22:07  Glucose-Capillary Latest Range: 70-99 mg/dL 782189 (H) 956349 (H) 213298 (H) 293 (H)      **Pt having elevated glucose levels. Noted Prednisone decreased to 20 mg daily today.  **Note pt started on Carbohydrate Modified diet on evening of 03/24   **Patient eating 60-100% of meals at present    MD- Please Increase Novolog SSI to Moderate scale tid ac + HS (currently getting Sensitive scale coverage)    Will follow. Ambrose FinlandJeannine Johnston Brenon Antosh RN, MSN, CDE Diabetes Coordinator Inpatient Diabetes Program Team Pager: [phone] (8a-10p)

## 2013-09-27 NOTE — Progress Notes (Signed)
Physical Therapy Treatment Patient Details Name: Glenda Underwood MRN: [id number] DOB: 02/12/1933 Today's Date: 09/27/2013    History of Present Illness 80 yo female admitted with actue on chronic respiratory failure, A fib. hx of TIA, dementia, spinal stenosis    PT Comments    Progressing slowly with mobility. Pt should still be able to return to ALF as long as facility is able to provide current level of care. Recommend HHPT.   Follow Up Recommendations  Home health PT;Supervision/Assistance - 24 hour     Equipment Recommendations  None recommended by PT    Recommendations for Other Services       Precautions / Restrictions Restrictions Weight Bearing Restrictions: No    Mobility  Bed Mobility Overal bed mobility: Needs Assistance Bed Mobility: Supine to Sit     Supine to sit: Min assist     General bed mobility comments: Increased time. Reliance on rail. VCs safety, technique, hand placement. Assist to get to EOB  Transfers Overall transfer level: Needs assistance   Transfers: Sit to/from Stand Sit to Stand: Min assist         General transfer comment: VCS safety, technique, hand placement. Assist to rise, stabilize.   Ambulation/Gait Ambulation/Gait assistance: Min guard Ambulation Distance (Feet): 110 Feet Assistive device: Rolling walker (2 wheeled) Gait Pattern/deviations: Step-through pattern;Decreased stride length     General Gait Details: slow gait speed. fatigues fairly easily. no LOB with use of walker   Underwood            Wheelchair Mobility    Modified Rankin (Stroke Patients Only)       Balance                                    Cognition Arousal/Alertness: Awake/alert Behavior During Therapy: WFL for tasks assessed/performed Overall Cognitive Status: History of cognitive impairments - at baseline                      Exercises      General Comments        Pertinent Vitals/Pain 96% RA during  ambulation    Home Living                      Prior Function            PT Goals (current goals can now be found in the care plan section) Progress towards PT goals: Progressing toward goals (slowly)    Frequency  Min 3X/week    PT Plan Current plan remains appropriate    End of Session   Activity Tolerance: Patient limited by fatigue Patient left: in chair;with chair alarm set;with call bell/phone within reach     Time: 0930-0958 PT Time Calculation (min): 28 min  Charges:  $Gait Training: 8-22 mins $Therapeutic Activity: 8-22 mins                    G Codes:      Rebeca AlertJannie Malayla Granberry, MPT Pager: [phone]

## 2013-09-28 LAB — BASIC METABOLIC PANEL
BUN: 30 mg/dL — ABNORMAL HIGH (ref 6–23)
CO2: 27 meq/L (ref 19–32)
CREATININE: 0.93 mg/dL (ref 0.50–1.10)
Calcium: 8.8 mg/dL (ref 8.4–10.5)
Chloride: 97 mEq/L (ref 96–112)
GFR calc Af Amer: 66 mL/min — ABNORMAL LOW (ref >90)
GFR calc non Af Amer: 57 mL/min — ABNORMAL LOW (ref >90)
GLUCOSE: 157 mg/dL — AB (ref 70–99)
Potassium: 4.7 mEq/L (ref 3.7–5.3)
SODIUM: 135 meq/L — AB (ref 137–147)

## 2013-09-28 LAB — PRO B NATRIURETIC PEPTIDE: Pro B Natriuretic peptide (BNP): 8403 pg/mL — ABNORMAL HIGH (ref 0–450)

## 2013-09-28 LAB — GLUCOSE, CAPILLARY
GLUCOSE-CAPILLARY: 133 mg/dL — AB (ref 70–99)
GLUCOSE-CAPILLARY: 198 mg/dL — AB (ref 70–99)
Glucose-Capillary: 130 mg/dL — ABNORMAL HIGH (ref 70–99)
Glucose-Capillary: 263 mg/dL — ABNORMAL HIGH (ref 70–99)

## 2013-09-28 MED ORDER — FUROSEMIDE 10 MG/ML IJ SOLN
40.0000 mg | Freq: Two times a day (BID) | INTRAMUSCULAR | Status: DC
  Administered 2013-09-28 – 2013-09-29 (×3): 40 mg via INTRAVENOUS
  Filled 2013-09-28 (×6): qty 4

## 2013-09-28 MED ORDER — LEVOFLOXACIN 750 MG PO TABS
750.0000 mg | ORAL_TABLET | ORAL | Status: DC
  Administered 2013-09-29 – 2013-10-01 (×2): 750 mg via ORAL
  Filled 2013-09-28 (×2): qty 1

## 2013-09-28 NOTE — Progress Notes (Signed)
Good urine output with IV lasix today, not an accurate count due to lots of incontinence with cough.

## 2013-09-28 NOTE — Progress Notes (Signed)
Pt has been unable to stay up in chair/ambulate due to back pain.  But does get back and forth to chair and BSC frequently.

## 2013-09-28 NOTE — Progress Notes (Signed)
SUBJECTIVE:  Feels breathing has slightly improved  . aspirin EC  81 mg Oral Daily  . atorvastatin  10 mg Oral q1800  . cholecalciferol  1,000 Units Oral Daily  . cloNIDine  0.3 mg Oral BID  . diltiazem  120 mg Oral Daily  . docusate sodium  200 mg Oral BID  . DULoxetine  60 mg Oral Daily  . furosemide  40 mg Oral Daily  . heparin subcutaneous  5,000 Units Subcutaneous 3 times per day  . hydrALAZINE  50 mg Oral QID  . insulin aspart  0-9 Units Subcutaneous TID WC & HS  . ipratropium  0.5 mg Nebulization Once  . levofloxacin (LEVAQUIN) IV  750 mg Intravenous Q48H  . lisinopril  20 mg Oral Daily  . LORazepam  0.5 mg Oral TID  . Memantine HCl ER  28 mg Oral Daily  . metoCLOPramide (REGLAN) injection  5 mg Intravenous 4 times per day  . pantoprazole  40 mg Oral Daily  . polyethylene glycol  17 g Oral Daily  . potassium chloride  20 mEq Oral Daily  . senna  1 tablet Oral BID  . sodium chloride  3 mL Intravenous Q12H  . traZODone  50 mg Oral QHS,MR X 1   OBJECTIVE:   Vitals:   Filed Vitals:   09/27/13 1310 09/27/13 1807 09/27/13 2100 09/28/13 0557  BP: 117/51 139/58 162/75 127/65  Pulse: 64 63 69 59  Temp: 97.8 F (36.6 C) 98.2 F (36.8 C) 98.9 F (37.2 C) 98.2 F (36.8 C)  TempSrc: Oral Oral Oral Oral  Resp: 20 19 22 20   Height:      Weight:      SpO2: 100% 98% 97% 97%   I&O's:    Intake/Output Summary (Last 24 hours) at 09/28/13 0845 Last data filed at 09/28/13 0700  Gross per 24 hour  Intake   1713 ml  Output    700 ml  Net   1013 ml   TELEMETRY: Reviewed telemetry pt in NSR  PHYSICAL EXAM General: Well developed, well nourished, SOB while talking Head: Eyes PERRLA, No xanthomas.   Normal cephalic and atramatic  Lungs:   Scattered wheezes and rhonchi. Heart:   HRRR S1 S2 Pulses are 2+ & equal. Abdomen: Bowel sounds are positive, abdomen soft and non-tender without masses Extremities:   No clubbing, cyanosis or edema.  DP +1 Neuro: Alert and oriented X  3. Psych:  Good affect, responds appropriately  LABS: Basic Metabolic Panel:  Recent Labs  16/10/96 0345 09/28/13 0421  NA 136* 135*  K 4.5 4.7  CL 102 97  CO2 21 27  GLUCOSE 179* 157*  BUN 41* 30*  CREATININE 1.10 0.93  CALCIUM 8.4 8.8    Recent Labs  09/26/13 0345 09/27/13 0336  WBC 18.3* 13.7*  HGB 11.4* 12.0  HCT 35.0* 35.6*  MCV 84.3 83.6  PLT 240 255   Coag Panel:   Lab Results  Component Value Date   INR 1.11 09/23/2013   INR 0.92 06/02/2012   RADIOLOGY:  Dg Chest Port 1 View  09/24/2013   CLINICAL DATA:  Shortness of breath.  EXAM: PORTABLE CHEST - 1 VIEW  COMPARISON:  Chest x-ray and CT scan dated 09/23/2013  FINDINGS: There is persistent cardiomegaly with slight pulmonary vascular congestion. Increased density in the right has improved with some residual density at the right base. This may represent faint pulmonary edema. Left lung is clear.  IMPRESSION: Improved faint infiltrates on the  right. I suspect the patient may have slight pulmonary edema.   Electronically Signed   By: Geanie CooleyJim  Maxwell M.D.   On: 09/24/2013 07:59    ASSESSMENT AND PLAN:   1. ATRIAL FIB: Secondary to acute pulmonary process. She is now back in NSR. Beta blocker and IV Cardizem were stopped and she is not on PO Cardizem immediate release. We can later change this to CD.  This is apparently her first episode of afib which was in the setting of acute illness.  Her CHADs2Vasc is 4 but given she has not had any episodes prior to this and this episode was in the setting of an acute illness, not sure that long term anticoagulation is indicated unless she has another episode.  Consider outpt event monitor to assess for silent PAF.  2. ELEVATED TROPONIN: Likely secondary to her acute illness. No evidence of a ACS although she has known CAD (stent and coronary calcification seen on CT). 2D echo showed EF 40-45% with hypokinesis of the mid and distal anteroseptum. This is a new finding since echo in  08/2013 and 06/2012. She has not had any chest pain since admission. Will need evaluation for progression of CAD once her pulmonary process has resolved. Most probably an outpatient nuclear stress test. Continue ASA. [place]   3. HTN: This will be managed in the context of treating the atrial fib. Continue other meds as listed.   4. ACUTE on CHRONIC DIASTOLIC CHF - She is currently on oral lasix with positive 1L in the last 24 hours, we will switch back to iv. Repeat BNP.  5. nsVT on telemetry - 3 beats, we will just monitor for now, add beta blockers once acute respiratory infection resolves.   6. PNA on antibx   Lars MassonNELSON, Ulyses Panico H, MD  09/28/2013  8:45 AM

## 2013-09-28 NOTE — Progress Notes (Signed)
PHARMACIST - PHYSICIAN COMMUNICATION DR:   TRH CONCERNING: Antibiotic IV to Oral Route Change Policy  RECOMMENDATION: This patient is receiving levofloxacin by the intravenous route.  Based on criteria approved by the Pharmacy and Therapeutics Committee, the antibiotic(s) is/are being converted to the equivalent oral dose form(s).   DESCRIPTION: These criteria include:  Patient being treated for a respiratory tract infection, urinary tract infection, cellulitis or clostridium difficile associated diarrhea if on metronidazole  The patient is not neutropenic and does not exhibit a GI malabsorption state  The patient is eating (either orally or via tube) and/or has been taking other orally administered medications for a least 24 hours  The patient is improving clinically and has a Tmax < 100.5  Day #6 levofloxacin  If you have questions about this conversion, please contact the Pharmacy Department  []   [phone]( [phone] )  [facility] []   [phone]( [phone] )  [facility]  []   [phone]  [facility] [x]   [phone] )  [facility]   Juliette Alcideustin Zalaya Astarita, PharmD, BCPS.   Pager: [phone] 09/28/2013 9:23 AM

## 2013-09-28 NOTE — Progress Notes (Addendum)
PATIENT DETAILS Name: Glenda Underwood Age: 80 y.o. Sex: female Date of Birth: 31-Mar-1933 Admit Date: 09/22/2013 Admitting Physician Edsel Petrin, DO RUE:[id number],NWGNF Homero Fellers, MD    Summary  80yr old female admitted for HCAP+CHF, developed Afib, NSTEMI, much better now on PO lasix and ABX, [facility]     Subjective:  Pt in bed no headache, no chest pain, no cough, improved SOB. No abdominal pain or diarrhea, no focal weakness.    Assessment/Plan:     Acute Hypoxic Resp failure due to HCAP+ Acute on Chr Diastolic and Systolic CHF Ef 45%+COPD exacerbation   HCAP -Clinically much improved after empiric antibiotics, we have tapered to only Levaquin IV on 09/27/2013, repeat 2 view chest x-ray on 09/27/2013 shows near complete resolution of infiltrate and edema . -Leucocytosis much improved, afebrile, cultures negative to date.     Acute on Chr Diastolic and Systolic CHF Ef 45% (down from few mths ago) -much improved, stable on oral Lasix, chest x-ray almost completely clear now, no rales on exam, no oxygen demand. -monitor daily weights, I &O's -Hold Beta blockers given bronchospasm and recurrent acute resp distress, On ACE, Cards following       COPD with acute Exacerbation -never formally diagnosed with COPD-but long time smoker-will need outpatient PFT's -Wheezing completely resolved, off steroids now.  - Outpatient pulmonary followup post discharge.      Afib RVR -placed on cardizem CD on 3-26, NSR now. -No anticoagulation per cardiology, Chads Vas2 - 4     Hx of CAD with mild non-STEMI -hx of PCI in 2013 -NSTEMI c/w ASA, Statins, hold metoprolol given bronchospasm, no anticoagulation per cardiology. -initial troponins neg-no chest pain-SOB secondary to above noted issues   -Cards monitoring, left heart catheterization to be done on Monday which will be 09/30/2013       HTN -controlled with  Clonidine,  Lasix, Cardizem,and Lisinopril -monitor BP and adjust medications accordingly       Dyslipidemia -c/w statins       Hx of Type 2 DM -last A1C-7.2 -appears to be not on any medications as outpatient -place on SSI while inpatient-on steroids -suspect will benefit from being on metformin on discharge  CBG (last 3)   Recent Labs  09/27/13 1605 09/27/13 2114 09/28/13 0730  GLUCAP 319* 130* 133*         Tobacco Abuse -counseled extensively -unfortunately was smoking till a few days back prior to this admit -does not think she needs transdermal nicotine        DVT Prophylaxis: Prophylactic Heparin       Code Status: Full code     Family Communication None at bedside    Procedures:  Echo, Ct Chest    CONSULTS: Cards    Time spent  40 minutes-which includes 50% of the time with face-to-face with patient/ family and coordinating care related to the above assessment and plan.    MEDICATIONS: Scheduled Meds: . aspirin EC  81 mg Oral Daily  . atorvastatin  10 mg Oral q1800  . cholecalciferol  1,000 Units Oral Daily  . cloNIDine  0.3 mg Oral BID  . diltiazem  120 mg Oral Daily  . docusate sodium  200 mg Oral BID  . DULoxetine  60 mg Oral Daily  . furosemide  40 mg Oral Daily  . heparin subcutaneous  5,000 Units Subcutaneous 3 times per day  . hydrALAZINE  50  mg Oral QID  . insulin aspart  0-9 Units Subcutaneous TID WC & HS  . ipratropium  0.5 mg Nebulization Once  . levofloxacin (LEVAQUIN) IV  750 mg Intravenous Q48H  . lisinopril  20 mg Oral Daily  . LORazepam  0.5 mg Oral TID  . Memantine HCl ER  28 mg Oral Daily  . metoCLOPramide (REGLAN) injection  5 mg Intravenous 4 times per day  . pantoprazole  40 mg Oral Daily  . polyethylene glycol  17 g Oral Daily  . potassium chloride  20 mEq Oral Daily  . predniSONE  10 mg Oral Q breakfast  . senna  1 tablet Oral BID  . sodium chloride  3 mL Intravenous Q12H  .  traZODone  50 mg Oral QHS,MR X 1   Continuous Infusions: . sodium chloride 20 mL/hr at 09/27/13 2222   PRN Meds:.acetaminophen, acetaminophen, alum & mag hydroxide-simeth, ipratropium, levalbuterol, LORazepam, morphine injection, ondansetron, traMADol  Antibiotics: Anti-infectives   Start     Dose/Rate Route Frequency Ordered Stop   09/23/13 1000  vancomycin (VANCOCIN) 500 mg in sodium chloride 0.9 % 100 mL IVPB  Status:  Discontinued     500 mg 100 mL/hr over 60 Minutes Intravenous Every 12 hours 09/23/13 0753 09/27/13 0906   09/23/13 0900  levofloxacin (LEVAQUIN) IVPB 750 mg     750 mg 100 mL/hr over 90 Minutes Intravenous Every 48 hours 09/23/13 0735     09/23/13 0900  aztreonam (AZACTAM) 1 g in dextrose 5 % 50 mL IVPB  Status:  Discontinued     1 g 100 mL/hr over 30 Minutes Intravenous 3 times per day 09/23/13 0753 09/27/13 0906   09/22/13 2130  aztreonam (AZACTAM) 2 g in dextrose 5 % 50 mL IVPB     2 g 100 mL/hr over 30 Minutes Intravenous  Once 09/22/13 2128 09/22/13 2333   09/22/13 2130  vancomycin (VANCOCIN) IVPB 1000 mg/200 mL premix     1,000 mg 200 mL/hr over 60 Minutes Intravenous  Once 09/22/13 2128 09/23/13 0104       PHYSICAL EXAM: Vital signs in last 24 hours: Filed Vitals:   09/27/13 1310 09/27/13 1807 09/27/13 2100 09/28/13 0557  BP: 117/51 139/58 162/75 127/65  Pulse: 64 63 69 59  Temp: 97.8 F (36.6 C) 98.2 F (36.8 C) 98.9 F (37.2 C) 98.2 F (36.8 C)  TempSrc: Oral Oral Oral Oral  Resp: 20 19 22 20   Height:      Weight:      SpO2: 100% 98% 97% 97%    Weight change:  Filed Weights   09/23/13 0130 09/23/13 0400 09/25/13 0403  Weight: 70.2 kg (154 lb 12.2 oz) 68.9 kg (151 lb 14.4 oz) 66.6 kg (146 lb 13.2 oz)   Body mass index is 26.85 kg/(m^2).   Gen Exam: Awake and alert with clear speech.  Mildly tachypneic Neck: Supple, No JVD.  Chest: good air entry-still with mild bilat rhonchi-  CVS: S1 S2 Regular, no murmurs.  Abdomen: soft, BS +,  non tender, non distended.  Extremities: no edema, lower extremities warm to touch. Neurologic: Non Focal.   Skin: No Rash.   Wounds: N/A.   Intake/Output from previous day:  Intake/Output Summary (Last 24 hours) at 09/28/13 0841 Last data filed at 09/28/13 0700  Gross per 24 hour  Intake   1713 ml  Output    700 ml  Net   1013 ml     LAB RESULTS: CBC  Recent Labs  Lab 09/23/13 0340 09/24/13 0221 09/25/13 0130 09/26/13 0345 09/27/13 0336  WBC 14.5* 37.1* 24.4* 18.3* 13.7*  HGB 13.2 12.9 11.9* 11.4* 12.0  HCT 40.0 37.7 35.1* 35.0* 35.6*  PLT 231 307 276 240 255  MCV 84.6 81.3 83.6 84.3 83.6  MCH 27.9 27.8 28.3 27.5 28.2  MCHC 33.0 34.2 33.9 32.6 33.7  RDW 13.8 13.8 14.4 14.1 13.7    Chemistries   Recent Labs Lab 09/23/13 1901 09/24/13 0221 09/25/13 0130 09/26/13 0345 09/28/13 0421  NA 133* 132* 136* 136* 135*  K 2.7* 3.9 4.2 4.5 4.7  CL 90* 93* 98 102 97  CO2 19 22 22 21 27   GLUCOSE 230* 250* 237* 179* 157*  BUN 23 23 34* 41* 30*  CREATININE 1.00 1.00 1.04 1.10 0.93  CALCIUM 9.4 8.6 8.6 8.4 8.8  MG 1.7  --   --   --   --     CBG:  Recent Labs Lab 09/27/13 0819 09/27/13 1141 09/27/13 1605 09/27/13 2114 09/28/13 0730  GLUCAP 155* 320* 319* 130* 133*    GFR Estimated Creatinine Clearance: 43.9 ml/min (by C-G formula based on Cr of 0.93).  Coagulation profile  Recent Labs Lab 09/23/13 2143  INR 1.11    Cardiac Enzymes  Recent Labs Lab 09/23/13 1901 09/24/13 0221 09/24/13 0705  TROPONINI 0.70* 0.85* 0.69*    No components found with this basename: POCBNP,  No results found for this basename: DDIMER,  in the last 72 hours No results found for this basename: HGBA1C,  in the last 72 hours No results found for this basename: CHOL, HDL, LDLCALC, TRIG, CHOLHDL, LDLDIRECT,  in the last 72 hours No results found for this basename: TSH, T4TOTAL, FREET3, T3FREE, THYROIDAB,  in the last 72 hours No results found for this basename:  VITAMINB12, FOLATE, FERRITIN, TIBC, IRON, RETICCTPCT,  in the last 72 hours No results found for this basename: LIPASE, AMYLASE,  in the last 72 hours  Urine Studies No results found for this basename: UACOL, UAPR, USPG, UPH, UTP, UGL, UKET, UBIL, UHGB, UNIT, UROB, ULEU, UEPI, UWBC, URBC, UBAC, CAST, CRYS, UCOM, BILUA,  in the last 72 hours  MICROBIOLOGY: Recent Results (from the past 240 hour(s))  MRSA PCR SCREENING     Status: None   Collection Time    09/23/13  1:28 AM      Result Value Ref Range Status   MRSA by PCR NEGATIVE  NEGATIVE Final   Comment:            The GeneXpert MRSA Assay (FDA     approved for NASAL specimens     only), is one component of a     comprehensive MRSA colonization     surveillance program. It is not     intended to diagnose MRSA     infection nor to guide or     monitor treatment for     MRSA infections.  CULTURE, BLOOD (ROUTINE X 2)     Status: None   Collection Time    09/23/13 10:00 AM      Result Value Ref Range Status   Specimen Description BLOOD LEFT ARM   Final   Special Requests BOTTLES DRAWN AEROBIC AND ANAEROBIC [facility]   Final   Culture  Setup Time     Final   Value: 09/23/2013 13:20     Performed at [facility]   Culture     Final   Value:  BLOOD CULTURE RECEIVED NO GROWTH TO DATE CULTURE WILL BE HELD FOR 5 DAYS BEFORE ISSUING A FINAL NEGATIVE REPORT     Performed at [facility]   Report Status PENDING   Incomplete  CULTURE, BLOOD (ROUTINE X 2)     Status: None   Collection Time    09/23/13 10:10 AM      Result Value Ref Range Status   Specimen Description BLOOD LEFT HAND   Final   Special Requests BOTTLES DRAWN AEROBIC AND ANAEROBIC 4CC   Final   Culture  Setup Time     Final   Value: 09/23/2013 13:21     Performed at [facility]   Culture     Final   Value:        BLOOD CULTURE RECEIVED NO GROWTH TO DATE CULTURE WILL BE HELD FOR 5 DAYS BEFORE ISSUING A FINAL NEGATIVE REPORT     Performed at  [facility]   Report Status PENDING   Incomplete    RADIOLOGY STUDIES/RESULTS: Dg Chest 2 View  09/22/2013   CLINICAL DATA:  Shortness of breath, pneumonia  EXAM: CHEST  2 VIEW  COMPARISON:  DG CHEST 1V PORT dated 08/16/2013  FINDINGS: There is no focal parenchymal opacity, pleural effusion, or pneumothorax. Stable cardiomegaly.  There is mild thoracic spine spondylosis.  IMPRESSION: No active cardiopulmonary disease.   Electronically Signed   By: Elige Ko   On: 09/22/2013 19:04   Dg Chest Port 1 View  09/23/2013   CLINICAL DATA:  Respiratory failure.  EXAM: PORTABLE CHEST - 1 VIEW  COMPARISON:  Chest x-ray 09/22/2013.  FINDINGS: Mild diffuse peribronchial cuffing throughout the lungs bilaterally. Patchy ill-defined airspace disease throughout the right mid to lower lung. No definite pleural effusions. No cephalization of the pulmonary vasculature. Heart size is mildly enlarged. The patient is rotated to the left on today's exam, resulting in distortion of the mediastinal contours and reduced diagnostic sensitivity and specificity for mediastinal pathology.  IMPRESSION: 1. The appearance the chest suggests bronchitis with multilobar bronchopneumonia in the right lung. 2. Mild cardiomegaly.   Electronically Signed   By: Trudie Reed M.D.   On: 09/23/2013 06:49    Leroy Sea, MD  [facility] Pager:[phone]  If 7PM-7AM, please contact night-coverage [email or web address] Password [facility] 09/28/2013, 8:41 AM   LOS: 6 days

## 2013-09-29 ENCOUNTER — Inpatient Hospital Stay (HOSPITAL_COMMUNITY): Payer: Medicare Other

## 2013-09-29 DIAGNOSIS — R079 Chest pain, unspecified: Secondary | ICD-10-CM

## 2013-09-29 LAB — CBC
HCT: 38.8 % (ref 36.0–46.0)
Hemoglobin: 13.4 g/dL (ref 12.0–15.0)
MCH: 28.2 pg (ref 26.0–34.0)
MCHC: 34.5 g/dL (ref 30.0–36.0)
MCV: 81.7 fL (ref 78.0–100.0)
PLATELETS: 265 10*3/uL (ref 150–400)
RBC: 4.75 MIL/uL (ref 3.87–5.11)
RDW: 13.4 % (ref 11.5–15.5)
WBC: 15.6 10*3/uL — ABNORMAL HIGH (ref 4.0–10.5)

## 2013-09-29 LAB — CULTURE, BLOOD (ROUTINE X 2)
CULTURE: NO GROWTH
Culture: NO GROWTH

## 2013-09-29 LAB — URINALYSIS, ROUTINE W REFLEX MICROSCOPIC
Bilirubin Urine: NEGATIVE
Glucose, UA: NEGATIVE mg/dL
HGB URINE DIPSTICK: NEGATIVE
Ketones, ur: NEGATIVE mg/dL
Leukocytes, UA: NEGATIVE
NITRITE: NEGATIVE
Protein, ur: NEGATIVE mg/dL
SPECIFIC GRAVITY, URINE: 1.009 (ref 1.005–1.030)
UROBILINOGEN UA: 1 mg/dL (ref 0.0–1.0)
pH: 7 (ref 5.0–8.0)

## 2013-09-29 LAB — GLUCOSE, CAPILLARY
GLUCOSE-CAPILLARY: 220 mg/dL — AB (ref 70–99)
GLUCOSE-CAPILLARY: 148 mg/dL — AB (ref 70–99)
Glucose-Capillary: 216 mg/dL — ABNORMAL HIGH (ref 70–99)
Glucose-Capillary: 166 mg/dL — ABNORMAL HIGH (ref 70–99)
Glucose-Capillary: 143 mg/dL — ABNORMAL HIGH (ref 70–99)

## 2013-09-29 MED ORDER — HYDRALAZINE HCL 25 MG PO TABS
25.0000 mg | ORAL_TABLET | Freq: Three times a day (TID) | ORAL | Status: DC
  Administered 2013-09-29 – 2013-10-01 (×5): 25 mg via ORAL
  Filled 2013-09-29 (×8): qty 1

## 2013-09-29 MED ORDER — CLONIDINE HCL 0.2 MG PO TABS: 0.2000 mg | ORAL_TABLET | Freq: Two times a day (BID) | ORAL | Status: DC

## 2013-09-29 MED ORDER — NYSTATIN 100000 UNIT/ML MT SUSP
5.0000 mL | Freq: Four times a day (QID) | OROMUCOSAL | Status: DC
  Administered 2013-09-29 – 2013-10-01 (×9): 500000 [IU] via ORAL
  Filled 2013-09-29 (×12): qty 5

## 2013-09-29 MED ORDER — CLONIDINE HCL 0.1 MG PO TABS: 0.1000 mg | ORAL_TABLET | Freq: Three times a day (TID) | ORAL | Status: DC

## 2013-09-29 MED ORDER — CLONAZEPAM 1 MG PO TABS
1.0000 mg | ORAL_TABLET | Freq: Every day | ORAL | Status: DC
  Administered 2013-09-30 – 2013-10-01 (×2): 1 mg via ORAL
  Filled 2013-09-29 (×2): qty 1

## 2013-09-29 MED ORDER — LISINOPRIL 10 MG PO TABS
10.0000 mg | ORAL_TABLET | Freq: Every day | ORAL | Status: DC
  Filled 2013-09-29: qty 1

## 2013-09-29 MED ORDER — CLONIDINE HCL 0.1 MG PO TABS
0.1000 mg | ORAL_TABLET | Freq: Three times a day (TID) | ORAL | Status: DC | PRN
  Administered 2013-10-01: 0.1 mg via ORAL
  Filled 2013-09-29 (×2): qty 1

## 2013-09-29 MED ORDER — FUROSEMIDE 10 MG/ML IJ SOLN
40.0000 mg | Freq: Once | INTRAMUSCULAR | Status: AC
  Administered 2013-09-29: 40 mg via INTRAVENOUS
  Filled 2013-09-29: qty 4

## 2013-09-29 MED ORDER — FUROSEMIDE 40 MG PO TABS
40.0000 mg | ORAL_TABLET | Freq: Two times a day (BID) | ORAL | Status: DC
  Filled 2013-09-29 (×3): qty 1

## 2013-09-29 MED ORDER — METOLAZONE 2.5 MG PO TABS
2.5000 mg | ORAL_TABLET | Freq: Once | ORAL | Status: DC
  Filled 2013-09-29: qty 1

## 2013-09-29 MED ORDER — VITAMINS A & D EX OINT
TOPICAL_OINTMENT | CUTANEOUS | Status: AC
  Administered 2013-09-29: 12:00:00
  Filled 2013-09-29: qty 5

## 2013-09-29 MED ORDER — NYSTATIN 100000 UNIT/GM EX POWD
Freq: Three times a day (TID) | CUTANEOUS | Status: DC
  Administered 2013-09-29 – 2013-10-01 (×7): via TOPICAL
  Filled 2013-09-29: qty 15

## 2013-09-29 NOTE — Progress Notes (Signed)
PATIENT DETAILS Name: Glenda StairsHilda A Underwood Age: 80 y.o. Sex: female Date of Birth: 02/07/1933 Admit Date: 09/22/2013 Admitting Physician Edsel PetrinElizabeth L Golding, DO VOZ:[id number],VZDGLPCP:[id number],PETER Homero FellersFRANK, MD     Summary  80yr old female admitted for HCAP+CHF, developed Afib, NSTEMI, much better now on PO lasix and ABX, Cards following for [facility] Monday.     Subjective:  Pt in bed no headache, no chest pain, no cough, improved SOB. No abdominal pain or diarrhea, no focal weakness.    Assessment/Plan:     Acute Hypoxic Resp failure due to HCAP+ Acute on Chr Diastolic and Systolic CHF Ef 45%+COPD exacerbation    HCAP -Clinically much improved after empiric antibiotics, we have tapered to only Levaquin on 09/27/2013, repeat 2 view chest x-ray on 09/27/2013 shows near complete resolution of infiltrate and edema . -Leucocytosis improving, afebrile, cultures negative to date.      Acute on Chr Diastolic and Systolic CHF Ef 45% (down from few mths ago)  -Slightly more short of breath and has some rales today, we'll give an extra dose of Lasix on 09/29/2013 we'll repeat chest x-ray, monitor clinically. No oxygen demand.  -monitor daily weights, I &O's   -Hold Beta blockers given bronchospasm and recurrent acute resp distress, On ACE, Cards following       COPD with acute Exacerbation -never formally diagnosed with COPD-but long time smoker-will need outpatient PFT's -Wheezing completely resolved, off steroids now.  - Outpatient pulmonary followup post discharge.      Afib RVR -placed on cardizem CD on 3-26, NSR now. -No anticoagulation per cardiology, Chads Vas2 - 4     Hx of CAD with mild non-STEMI -hx of PCI in 2013 -NSTEMI c/w ASA, Statins, hold metoprolol given bronchospasm, no anticoagulation per cardiology. -Cards monitoring, outpatient stress test versus left heart catheterization to be done on Monday which will be 09/30/2013, have paged Dr. Delton SeeNelson  to clarify have left a message on her cell phone [phone] on 09/29/2013 10 AM       HTN -controlled with Clonidine,  Lasix, Cardizem,and Lisinopril -monitor BP and adjust medications accordingly       Dyslipidemia -c/w statins       Hx of Type 2 DM -last A1C-7.2 -appears to be not on any medications as outpatient -place on SSI while inpatient-on steroids -suspect will benefit from being on metformin on discharge  CBG (last 3)   Recent Labs  09/28/13 1724 09/28/13 2217 09/29/13 0734  GLUCAP 198* 216* 166*         Tobacco Abuse -counseled extensively -unfortunately was smoking till a few days back prior to this admit -does not think she needs transdermal nicotine        DVT Prophylaxis: Prophylactic Heparin       Code Status: Full code      Family Communication  Annice PihJackie daughter [phone]    Procedures:  Echo, Ct Chest     CONSULTS:  Cards    Time spent  40 minutes-which includes 50% of the time with face-to-face with patient/ family and coordinating care related to the above assessment and plan.    MEDICATIONS: Scheduled Meds: . aspirin EC  81 mg Oral Daily  . atorvastatin  10 mg Oral q1800  . cholecalciferol  1,000 Units Oral Daily  . cloNIDine  0.3 mg Oral BID  . diltiazem  120 mg Oral Daily  . docusate sodium  200 mg Oral BID  . DULoxetine  60 mg Oral Daily  . furosemide  40 mg Intravenous BID  . furosemide  40 mg Intravenous Once  . heparin subcutaneous  5,000 Units Subcutaneous 3 times per day  . hydrALAZINE  50 mg Oral QID  . insulin aspart  0-9 Units Subcutaneous TID WC & HS  . ipratropium  0.5 mg Nebulization Once  . levofloxacin  750 mg Oral Q48H  . lisinopril  20 mg Oral Daily  . LORazepam  0.5 mg Oral TID  . Memantine HCl ER  28 mg Oral Daily  . metoCLOPramide (REGLAN) injection  5 mg Intravenous 4 times per day  . pantoprazole  40 mg Oral Daily  . polyethylene glycol  17 g Oral Daily  .  potassium chloride  20 mEq Oral Daily  . senna  1 tablet Oral BID  . sodium chloride  3 mL Intravenous Q12H  . traZODone  50 mg Oral QHS,MR X 1   Continuous Infusions: . sodium chloride 20 mL/hr at 09/27/13 2222   PRN Meds:.acetaminophen, acetaminophen, alum & mag hydroxide-simeth, ipratropium, levalbuterol, LORazepam, morphine injection, ondansetron, traMADol  Antibiotics: Anti-infectives   Start     Dose/Rate Route Frequency Ordered Stop   09/29/13 1000  levofloxacin (LEVAQUIN) tablet 750 mg     750 mg Oral Every 48 hours 09/28/13 0925     09/23/13 1000  vancomycin (VANCOCIN) 500 mg in sodium chloride 0.9 % 100 mL IVPB  Status:  Discontinued     500 mg 100 mL/hr over 60 Minutes Intravenous Every 12 hours 09/23/13 0753 09/27/13 0906   09/23/13 0900  levofloxacin (LEVAQUIN) IVPB 750 mg  Status:  Discontinued     750 mg 100 mL/hr over 90 Minutes Intravenous Every 48 hours 09/23/13 0735 09/28/13 0925   09/23/13 0900  aztreonam (AZACTAM) 1 g in dextrose 5 % 50 mL IVPB  Status:  Discontinued     1 g 100 mL/hr over 30 Minutes Intravenous 3 times per day 09/23/13 0753 09/27/13 0906   09/22/13 2130  aztreonam (AZACTAM) 2 g in dextrose 5 % 50 mL IVPB     2 g 100 mL/hr over 30 Minutes Intravenous  Once 09/22/13 2128 09/22/13 2333   09/22/13 2130  vancomycin (VANCOCIN) IVPB 1000 mg/200 mL premix     1,000 mg 200 mL/hr over 60 Minutes Intravenous  Once 09/22/13 2128 09/23/13 0104       PHYSICAL EXAM: Vital signs in last 24 hours: Filed Vitals:   09/28/13 1156 09/28/13 1323 09/28/13 2210 09/29/13 0500  BP: 151/62 145/71 136/68 132/70  Pulse:  63 60 67  Temp:  97.9 F (36.6 C) 97.6 F (36.4 C) 98 F (36.7 C)  TempSrc:  Oral Oral Oral  Resp:  16 16 18   Height:      Weight:      SpO2:  96% 97% 99%    Weight change:  Filed Weights   09/23/13 0130 09/23/13 0400 09/25/13 0403  Weight: 70.2 kg (154 lb 12.2 oz) 68.9 kg (151 lb 14.4 oz) 66.6 kg (146 lb 13.2 oz)   Body mass index is  26.85 kg/(m^2).   Gen Exam: Awake and alert with clear speech.  Mildly tachypneic Neck: Supple, No JVD.  Chest: good air entry-still with mild bilat rhonchi-  CVS: S1 S2 Regular, no murmurs.  Abdomen: soft, BS +, non tender, non distended.  Extremities: no edema, lower extremities warm to touch. Neurologic: Non Focal.   Skin: No Rash.   Wounds: N/A.   Intake/Output from  previous day:  Intake/Output Summary (Last 24 hours) at 09/29/13 1011 Last data filed at 09/29/13 0916  Gross per 24 hour  Intake   1220 ml  Output   2100 ml  Net   -880 ml     LAB RESULTS: CBC  Recent Labs Lab 09/24/13 0221 09/25/13 0130 09/26/13 0345 09/27/13 0336 09/29/13 0353  WBC 37.1* 24.4* 18.3* 13.7* 15.6*  HGB 12.9 11.9* 11.4* 12.0 13.4  HCT 37.7 35.1* 35.0* 35.6* 38.8  PLT 307 276 240 255 265  MCV 81.3 83.6 84.3 83.6 81.7  MCH 27.8 28.3 27.5 28.2 28.2  MCHC 34.2 33.9 32.6 33.7 34.5  RDW 13.8 14.4 14.1 13.7 13.4    Chemistries   Recent Labs Lab 09/23/13 1901 09/24/13 0221 09/25/13 0130 09/26/13 0345 09/28/13 0421  NA 133* 132* 136* 136* 135*  K 2.7* 3.9 4.2 4.5 4.7  CL 90* 93* 98 102 97  CO2 19 22 22 21 27   GLUCOSE 230* 250* 237* 179* 157*  BUN 23 23 34* 41* 30*  CREATININE 1.00 1.00 1.04 1.10 0.93  CALCIUM 9.4 8.6 8.6 8.4 8.8  MG 1.7  --   --   --   --     CBG:  Recent Labs Lab 09/28/13 0730 09/28/13 1158 09/28/13 1724 09/28/13 2217 09/29/13 0734  GLUCAP 133* 263* 198* 216* 166*    GFR Estimated Creatinine Clearance: 43.9 ml/min (by C-G formula based on Cr of 0.93).  Coagulation profile  Recent Labs Lab 09/23/13 2143  INR 1.11    Cardiac Enzymes  Recent Labs Lab 09/23/13 1901 09/24/13 0221 09/24/13 0705  TROPONINI 0.70* 0.85* 0.69*    No components found with this basename: POCBNP,  No results found for this basename: DDIMER,  in the last 72 hours No results found for this basename: HGBA1C,  in the last 72 hours No results found for this  basename: CHOL, HDL, LDLCALC, TRIG, CHOLHDL, LDLDIRECT,  in the last 72 hours No results found for this basename: TSH, T4TOTAL, FREET3, T3FREE, THYROIDAB,  in the last 72 hours No results found for this basename: VITAMINB12, FOLATE, FERRITIN, TIBC, IRON, RETICCTPCT,  in the last 72 hours No results found for this basename: LIPASE, AMYLASE,  in the last 72 hours  Urine Studies No results found for this basename: UACOL, UAPR, USPG, UPH, UTP, UGL, UKET, UBIL, UHGB, UNIT, UROB, ULEU, UEPI, UWBC, URBC, UBAC, CAST, CRYS, UCOM, BILUA,  in the last 72 hours  MICROBIOLOGY: Recent Results (from the past 240 hour(s))  MRSA PCR SCREENING     Status: None   Collection Time    09/23/13  1:28 AM      Result Value Ref Range Status   MRSA by PCR NEGATIVE  NEGATIVE Final   Comment:            The GeneXpert MRSA Assay (FDA     approved for NASAL specimens     only), is one component of a     comprehensive MRSA colonization     surveillance program. It is not     intended to diagnose MRSA     infection nor to guide or     monitor treatment for     MRSA infections.  CULTURE, BLOOD (ROUTINE X 2)     Status: None   Collection Time    09/23/13 10:00 AM      Result Value Ref Range Status   Specimen Description BLOOD LEFT ARM   Final   Special Requests BOTTLES  DRAWN AEROBIC AND ANAEROBIC [facility]   Final   Culture  Setup Time     Final   Value: 09/23/2013 13:20     Performed at [facility]   Culture     Final   Value:        BLOOD CULTURE RECEIVED NO GROWTH TO DATE CULTURE WILL BE HELD FOR 5 DAYS BEFORE ISSUING A FINAL NEGATIVE REPORT     Performed at [facility]   Report Status PENDING   Incomplete  CULTURE, BLOOD (ROUTINE X 2)     Status: None   Collection Time    09/23/13 10:10 AM      Result Value Ref Range Status   Specimen Description BLOOD LEFT HAND   Final   Special Requests BOTTLES DRAWN AEROBIC AND ANAEROBIC 4CC   Final   Culture  Setup Time     Final   Value: 09/23/2013  13:21     Performed at [facility]   Culture     Final   Value:        BLOOD CULTURE RECEIVED NO GROWTH TO DATE CULTURE WILL BE HELD FOR 5 DAYS BEFORE ISSUING A FINAL NEGATIVE REPORT     Performed at [facility]   Report Status PENDING   Incomplete    RADIOLOGY STUDIES/RESULTS: Dg Chest 2 View  09/22/2013   CLINICAL DATA:  Shortness of breath, pneumonia  EXAM: CHEST  2 VIEW  COMPARISON:  DG CHEST 1V PORT dated 08/16/2013  FINDINGS: There is no focal parenchymal opacity, pleural effusion, or pneumothorax. Stable cardiomegaly.  There is mild thoracic spine spondylosis.  IMPRESSION: No active cardiopulmonary disease.   Electronically Signed   By: Elige Ko   On: 09/22/2013 19:04   Dg Chest Port 1 View  09/23/2013   CLINICAL DATA:  Respiratory failure.  EXAM: PORTABLE CHEST - 1 VIEW  COMPARISON:  Chest x-ray 09/22/2013.  FINDINGS: Mild diffuse peribronchial cuffing throughout the lungs bilaterally. Patchy ill-defined airspace disease throughout the right mid to lower lung. No definite pleural effusions. No cephalization of the pulmonary vasculature. Heart size is mildly enlarged. The patient is rotated to the left on today's exam, resulting in distortion of the mediastinal contours and reduced diagnostic sensitivity and specificity for mediastinal pathology.  IMPRESSION: 1. The appearance the chest suggests bronchitis with multilobar bronchopneumonia in the right lung. 2. Mild cardiomegaly.   Electronically Signed   By: Trudie Reed M.D.   On: 09/23/2013 06:49    Leroy Sea, MD  [facility] Pager:[phone]  If 7PM-7AM, please contact night-coverage [email or web address] Password [email or web address] 09/29/2013, 10:11 AM   LOS: 7 days

## 2013-09-29 NOTE — Progress Notes (Signed)
SUBJECTIVE:  Feels breathing has improved  . aspirin EC  81 mg Oral Daily  . atorvastatin  10 mg Oral q1800  . cholecalciferol  1,000 Units Oral Daily  . cloNIDine  0.3 mg Oral BID  . diltiazem  120 mg Oral Daily  . docusate sodium  200 mg Oral BID  . DULoxetine  60 mg Oral Daily  . furosemide  40 mg Intravenous BID  . heparin subcutaneous  5,000 Units Subcutaneous 3 times per day  . hydrALAZINE  50 mg Oral QID  . insulin aspart  0-9 Units Subcutaneous TID WC & HS  . ipratropium  0.5 mg Nebulization Once  . levofloxacin  750 mg Oral Q48H  . lisinopril  20 mg Oral Daily  . LORazepam  0.5 mg Oral TID  . Memantine HCl ER  28 mg Oral Daily  . metoCLOPramide (REGLAN) injection  5 mg Intravenous 4 times per day  . pantoprazole  40 mg Oral Daily  . polyethylene glycol  17 g Oral Daily  . potassium chloride  20 mEq Oral Daily  . senna  1 tablet Oral BID  . sodium chloride  3 mL Intravenous Q12H  . traZODone  50 mg Oral QHS,MR X 1   OBJECTIVE:   Vitals:   Filed Vitals:   09/28/13 1156 09/28/13 1323 09/28/13 2210 09/29/13 0500  BP: 151/62 145/71 136/68 132/70  Pulse:  63 60 67  Temp:  97.9 F (36.6 C) 97.6 F (36.4 C) 98 F (36.7 C)  TempSrc:  Oral Oral Oral  Resp:  16 16 18   Height:      Weight:      SpO2:  96% 97% 99%   I&O's:    Intake/Output Summary (Last 24 hours) at 09/29/13 0734 Last data filed at 09/28/13 2300  Gross per 24 hour  Intake   1220 ml  Output   1775 ml  Net   -555 ml   TELEMETRY: Reviewed telemetry pt in NSR  PHYSICAL EXAM General: Well developed, well nourished, SOB while talking Head: Eyes PERRLA, No xanthomas.   Normal cephalic and atramatic  Lungs:   Scattered wheezes and rhonchi. Heart:   HRRR S1 S2 Pulses are 2+ & equal. Abdomen: Bowel sounds are positive, abdomen soft and non-tender without masses Extremities:   No clubbing, cyanosis or edema.  DP +1 Neuro: Alert and oriented X 3. Psych:  Good affect, responds  appropriately  LABS: Basic Metabolic Panel:  Recent Labs  16/10/96 0421  NA 135*  K 4.7  CL 97  CO2 27  GLUCOSE 157*  BUN 30*  CREATININE 0.93  CALCIUM 8.8    Recent Labs  09/27/13 0336 09/29/13 0353  WBC 13.7* 15.6*  HGB 12.0 13.4  HCT 35.6* 38.8  MCV 83.6 81.7  PLT 255 265   Coag Panel:   Lab Results  Component Value Date   INR 1.11 09/23/2013   INR 0.92 06/02/2012   RADIOLOGY:  Dg Chest Port 1 View  09/24/2013   CLINICAL DATA:  Shortness of breath.  EXAM: PORTABLE CHEST - 1 VIEW  COMPARISON:  Chest x-ray and CT scan dated 09/23/2013  FINDINGS: There is persistent cardiomegaly with slight pulmonary vascular congestion. Increased density in the right has improved with some residual density at the right base. This may represent faint pulmonary edema. Left lung is clear.  IMPRESSION: Improved faint infiltrates on the right. I suspect the patient may have slight pulmonary edema.   Electronically Signed  By: Geanie CooleyJim  Maxwell M.D.   On: 09/24/2013 07:59     ASSESSMENT AND PLAN:   1. ATRIAL FIB: Secondary to acute pulmonary process. She is now back in NSR and no episodes of a-fib in the last 48 hours. This is apparently her first episode of afib which was in the setting of acute illness.  Her CHADs2Vasc is 4 but given she has not had any episodes prior to this and this episode was in the setting of an acute illness, not sure that long term anticoagulation is indicated unless she has another episode.  Consider outpt event monitor to assess for silent PAF.  2. ELEVATED TROPONIN: Likely secondary to her acute illness. No evidence of a ACS although she has known CAD (stent and coronary calcification seen on CT). 2D echo showed EF 40-45% with hypokinesis of the mid and distal anteroseptum. This is a new finding since echo in 08/2013 and 06/2012. She has not had any chest pain since admission. Will need evaluation for progression of CAD once her pulmonary infectious process has resolved.  Most probably an outpatient nuclear stress test. Continue ASA. [place]   3. HTN: This will be managed in the context of treating the atrial fib. Continue other meds as listed. On long acting cardizem.  4. ACUTE on CHRONIC DIASTOLIC CHF - good urine output and negative balance after switching to iv Lasix, we will continue till tomorrow and re-assess. BNP minimally improved.  5. nsVT on telemetry - 3 beats yesterday, we will just monitor for now.  6. PNA on antibx   Glenda MassonNELSON, Glenda Gilland H, MD  09/29/2013  7:34 AM

## 2013-09-30 LAB — BASIC METABOLIC PANEL
BUN: 39 mg/dL — ABNORMAL HIGH (ref 6–23)
CALCIUM: 9.5 mg/dL (ref 8.4–10.5)
CO2: 21 mEq/L (ref 19–32)
Chloride: 97 mEq/L (ref 96–112)
Creatinine, Ser: 1.28 mg/dL — ABNORMAL HIGH (ref 0.50–1.10)
GFR calc Af Amer: 45 mL/min — ABNORMAL LOW (ref >90)
GFR calc non Af Amer: 39 mL/min — ABNORMAL LOW (ref >90)
Glucose, Bld: 182 mg/dL — ABNORMAL HIGH (ref 70–99)
Potassium: 4 mEq/L (ref 3.7–5.3)
SODIUM: 135 meq/L — AB (ref 137–147)

## 2013-09-30 LAB — URINE CULTURE
Colony Count: NO GROWTH
Culture: NO GROWTH

## 2013-09-30 LAB — GLUCOSE, CAPILLARY
GLUCOSE-CAPILLARY: 212 mg/dL — AB (ref 70–99)
GLUCOSE-CAPILLARY: 142 mg/dL — AB (ref 70–99)
Glucose-Capillary: 154 mg/dL — ABNORMAL HIGH (ref 70–99)
Glucose-Capillary: 203 mg/dL — ABNORMAL HIGH (ref 70–99)

## 2013-09-30 LAB — CBC
HCT: 39.8 % (ref 36.0–46.0)
Hemoglobin: 13.4 g/dL (ref 12.0–15.0)
MCH: 27.6 pg (ref 26.0–34.0)
MCHC: 33.7 g/dL (ref 30.0–36.0)
MCV: 81.9 fL (ref 78.0–100.0)
PLATELETS: 255 10*3/uL (ref 150–400)
RBC: 4.86 MIL/uL (ref 3.87–5.11)
RDW: 13.6 % (ref 11.5–15.5)
WBC: 19.6 10*3/uL — AB (ref 4.0–10.5)

## 2013-09-30 MED ORDER — IPRATROPIUM BROMIDE 0.02 % IN SOLN
0.5000 mg | RESPIRATORY_TRACT | Status: AC
  Administered 2013-09-30: 0.5 mg via RESPIRATORY_TRACT
  Filled 2013-09-30: qty 2.5

## 2013-09-30 MED ORDER — FUROSEMIDE 10 MG/ML IJ SOLN
40.0000 mg | Freq: Once | INTRAMUSCULAR | Status: AC
  Administered 2013-09-30: 40 mg via INTRAVENOUS
  Filled 2013-09-30: qty 4

## 2013-09-30 MED ORDER — FUROSEMIDE 40 MG PO TABS
40.0000 mg | ORAL_TABLET | Freq: Once | ORAL | Status: AC
Start: 2013-09-30 — End: 2013-09-30
  Administered 2013-09-30: 40 mg via ORAL
  Filled 2013-09-30: qty 1

## 2013-09-30 MED ORDER — LEVALBUTEROL HCL 0.63 MG/3ML IN NEBU
0.6300 mg | INHALATION_SOLUTION | RESPIRATORY_TRACT | Status: AC
  Administered 2013-09-30: 0.63 mg via RESPIRATORY_TRACT
  Filled 2013-09-30: qty 3

## 2013-09-30 NOTE — Progress Notes (Signed)
SUBJECTIVE: She feels weak but breathing is better. No pain.   BP 126/65  Pulse 77  Temp(Src) 97.9 F (36.6 C) (Oral)  Resp 16  Ht 5\' 2"  (1.575 m)  Wt 146 lb 13.2 oz (66.6 kg)  BMI 26.85 kg/m2  SpO2 100%  Intake/Output Summary (Last 24 hours) at 09/30/13 0716 Last data filed at 09/30/13 0500  Gross per 24 hour  Intake    960 ml  Output   1650 ml  Net   -690 ml    PHYSICAL EXAM General: Well developed, well nourished, in no acute distress. Alert and oriented x 3.  Psych:  Good affect, responds appropriately Neck: No JVD. No masses noted.  Lungs: rhonci bilaterally  Heart: RRR with no murmurs noted. Abdomen: Bowel sounds are present. Soft, non-tender.  Extremities: No lower extremity edema.   LABS: Basic Metabolic Panel:  Recent Labs  40/98/11 0421 09/30/13 0343  NA 135* 135*  K 4.7 4.0  CL 97 97  CO2 27 21  GLUCOSE 157* 182*  BUN 30* 39*  CREATININE 0.93 1.28*  CALCIUM 8.8 9.5   CBC:  Recent Labs  09/29/13 0353 09/30/13 0343  WBC 15.6* 19.6*  HGB 13.4 13.4  HCT 38.8 39.8  MCV 81.7 81.9  PLT 265 255   Current Meds: . aspirin EC  81 mg Oral Daily  . atorvastatin  10 mg Oral q1800  . cholecalciferol  1,000 Units Oral Daily  . clonazePAM  1 mg Oral Daily  . diltiazem  120 mg Oral Daily  . docusate sodium  200 mg Oral BID  . DULoxetine  60 mg Oral Daily  . furosemide  40 mg Oral BID  . heparin subcutaneous  5,000 Units Subcutaneous 3 times per day  . hydrALAZINE  25 mg Oral 3 times per day  . insulin aspart  0-9 Units Subcutaneous TID WC & HS  . ipratropium  0.5 mg Nebulization Once  . levofloxacin  750 mg Oral Q48H  . lisinopril  10 mg Oral Daily  . Memantine HCl ER  28 mg Oral Daily  . metoCLOPramide (REGLAN) injection  5 mg Intravenous 4 times per day  . metolazone  2.5 mg Oral Once  . nystatin  5 mL Oral QID  . nystatin   Topical TID  . pantoprazole  40 mg Oral Daily  . polyethylene glycol  17 g Oral Daily  . potassium chloride  20  mEq Oral Daily  . senna  1 tablet Oral BID  . sodium chloride  3 mL Intravenous Q12H  . traZODone  50 mg Oral QHS,MR X 1     ASSESSMENT AND PLAN:  1. Atrial fibrillation:  Secondary to acute pulmonary process. She is maintaining NSR on Cardizem CD 120 mg daily.  This is apparently her first episode of documented atrial fibrillation which was in the setting of acute illness. Her CHADs2Vasc is 4 but given she has not had any episodes prior to this and this episode was in the setting of an acute illness, will not start long term anticoagulation. She will need an outpatient event monitor to assess for silent PAF.   2. Elevated troponin: known CAD. Elevated troponin likely secondary to her acute illness.  2D echo showed EF 40-45% with hypokinesis of the mid and distal anteroseptum. This is a new finding since echo in 08/2013 and 06/2012. She has not had any chest pain since admission. Continue ASA. Will likely need outpatient stress myoview.  3. HTN: BP controlled.    4. Acute on chronic diastolic CHF:  She is now on po Lasix. Net negative 700 cc last 24 hours, 5.7 liters since admission.     MCALHANY,CHRISTOPHER  3/30/20157:16 AM

## 2013-09-30 NOTE — Progress Notes (Signed)
Physical Therapy Treatment Patient Details Name: Glenda StairsHilda A Underwood MRN: [id number] DOB: 06/07/1933 Today's Date: 09/30/2013    History of Present Illness 80 yo female admitted with actue on chronic respiratory failure, A fib. hx of TIA, dementia, spinal stenosis    PT Comments    Pt ambulated in hallway and had increased activity tolerance.  Pt performed LE exercises and expressed eagerness to participate in more PT.  Bed alarm going off upon entering room, pt eager to get up.  Pt seemed confused as she had just woken from sleeping, however, oriented to place and situation.  Follow Up Recommendations  Home health PT;Supervision/Assistance - 24 hour     Equipment Recommendations  None recommended by PT    Recommendations for Other Services       Precautions / Restrictions Precautions Precautions: Fall Restrictions Weight Bearing Restrictions: No    Mobility  Bed Mobility               General bed mobility comments: pt at EOB upon entering room  Transfers Overall transfer level: Needs assistance Equipment used: Rolling walker (2 wheeled) Transfers: Sit to/from Stand Sit to Stand: Min assist         General transfer comment: min assist to rise, min guard to sit; verbal cues for safety  Ambulation/Gait Ambulation/Gait assistance: Min guard Ambulation Distance (Feet): 300 Feet Assistive device: Rolling walker (2 wheeled) Gait Pattern/deviations: Step-through pattern;Decreased stance time - right Gait velocity: decr   General Gait Details: verbal cues for safety; no LOB with walker, increased activity tolerance, no complaints of fatigue   Underwood            Wheelchair Mobility    Modified Rankin (Stroke Patients Only)       Balance                                    Cognition Arousal/Alertness: Awake/alert Behavior During Therapy: WFL for tasks assessed/performed Overall Cognitive Status: History of cognitive impairments - at  baseline                      Exercises General Exercises - Lower Extremity Long Arc Quad: AROM;Both;10 reps Heel Slides: AROM;Both;10 reps Hip ABduction/ADduction: AROM;Both;10 reps Straight Leg Raises: AROM;Both;10 reps Hip Flexion/Marching: AROM;Seated;Both;15 reps    General Comments        Pertinent Vitals/Pain Activity to tolerance.  Pt did not complain of pain.  While ambulating SpO2 97% on room air, HR 95 bpm.    Home Living                      Prior Function            PT Goals (current goals can now be found in the care plan section) Acute Rehab PT Goals Patient Stated Goal[facility] son PT Goal Formulation: With patient/family Time For Goal Achievement: 10/10/13 Potential to Achieve Goals: Good Progress towards PT goals: Progressing toward goals    Frequency  Min 3X/week    PT Plan Current plan remains appropriate    End of Session   Activity Tolerance: Patient tolerated treatment well Patient left: in chair;with call bell/phone within reach;with chair alarm set     Time: 1400-1425 PT Time Calculation (min): 25 min  Charges:  $Gait Training: 8-22 mins $Therapeutic Exercise: 8-22 mins  G CodesBufford Lope 09/30/2013, 3:58 PM Bufford Lope SPT 09/30/2013

## 2013-09-30 NOTE — Progress Notes (Signed)
PATIENT DETAILS Name: Glenda Underwood Age: 80 y.o. Sex: female Date of Birth: 11-06-1932 Admit Date: 09/22/2013 Admitting Physician Edsel Petrin, DO ZOX:[id number],XBJYN Homero Fellers, MD     Summary  80yr old female admitted for HCAP+CHF, developed Afib, NSTEMI, much better now on PO lasix and ABX, Cards following for [facility] Monday.     Subjective:  Pt in bed no headache, no chest pain, no cough, improved SOB. No abdominal pain or diarrhea, no focal weakness.    Assessment/Plan:     Acute Hypoxic Resp failure due to HCAP+ Acute on Chr Diastolic and Systolic CHF Ef 45%+COPD exacerbation    HCAP -Clinically much improved after empiric antibiotics, we have tapered to only Levaquin on 09/27/2013, repeat 2 view chest x-ray on 09/27/2013 shows near complete resolution of infiltrate and edema , still has Leucocytosis despite near clear CXR x 2, remains afebrile, cultures negative to date. Monitor.      Acute on Chr Diastolic and Systolic CHF Ef 45% (down from few mths ago)  -net >5lit negative, BP sligthly soft, Creat mildly high, reduced Lasix to 1 PO dose today. Repeat BMP in am, off O2 and feels better.  -monitor daily weights, I &O's   -Hold Beta blockers given bronchospasm and recurrent acute resp distress, On ACE, Cards following       COPD with acute Exacerbation -never formally diagnosed with COPD-but long time smoker-will need outpatient PFT's -Wheezing completely resolved, off steroids now.  - Outpatient pulmonary followup post discharge.      Afib RVR -placed on cardizem CD on 3-26, NSR now. -No anticoagulation per cardiology, Chads Vas2 - 4     Hx of CAD with mild non-STEMI -hx of PCI in 2013 -NSTEMI c/w ASA, Statins, hold metoprolol given bronchospasm, no anticoagulation per cardiology. -Cards monitoring, outpatient stress test per cards Dr. Delton See D/W her on 09/29/2013 10 AM       HTN -controlled with Clonidine,   Lasix, Cardizem,and Lisinopril -monitor BP and adjust medications accordingly       Dyslipidemia -c/w statins       Hx of Type 2 DM -last A1C-7.2 -appears to be not on any medications as outpatient -place on SSI while inpatient-on steroids -suspect will benefit from being on metformin on discharge  CBG (last 3)   Recent Labs  09/29/13 1708 09/29/13 2218 09/30/13 0737  GLUCAP 148* 143* 154*         Tobacco Abuse -counseled extensively -unfortunately was smoking till a few days back prior to this admit -does not think she needs transdermal nicotine        DVT Prophylaxis: Prophylactic Heparin       Code Status: Full code      Family Communication  Annice Pih daughter [phone]    Procedures:  Echo, Ct Chest     CONSULTS:  Cards    Time spent  40 minutes-which includes 50% of the time with face-to-face with patient/ family and coordinating care related to the above assessment and plan.    MEDICATIONS: Scheduled Meds: . aspirin EC  81 mg Oral Daily  . atorvastatin  10 mg Oral q1800  . cholecalciferol  1,000 Units Oral Daily  . clonazePAM  1 mg Oral Daily  . diltiazem  120 mg Oral Daily  . docusate sodium  200 mg Oral BID  . DULoxetine  60 mg Oral Daily  . furosemide  40 mg Oral Once  . heparin subcutaneous  5,000 Units Subcutaneous 3 times per day  . hydrALAZINE  25 mg Oral 3 times per day  . insulin aspart  0-9 Units Subcutaneous TID WC & HS  . ipratropium  0.5 mg Nebulization Once  . levofloxacin  750 mg Oral Q48H  . Memantine HCl ER  28 mg Oral Daily  . metoCLOPramide (REGLAN) injection  5 mg Intravenous 4 times per day  . nystatin  5 mL Oral QID  . nystatin   Topical TID  . pantoprazole  40 mg Oral Daily  . polyethylene glycol  17 g Oral Daily  . potassium chloride  20 mEq Oral Daily  . senna  1 tablet Oral BID  . sodium chloride  3 mL Intravenous Q12H  . traZODone  50 mg Oral QHS,MR X 1   Continuous  Infusions: . sodium chloride 20 mL/hr at 09/27/13 2222   PRN Meds:.acetaminophen, alum & mag hydroxide-simeth, cloNIDine, ipratropium, levalbuterol, LORazepam, morphine injection, ondansetron, traMADol  Antibiotics: Anti-infectives   Start     Dose/Rate Route Frequency Ordered Stop   09/29/13 1000  levofloxacin (LEVAQUIN) tablet 750 mg     750 mg Oral Every 48 hours 09/28/13 0925     09/23/13 1000  vancomycin (VANCOCIN) 500 mg in sodium chloride 0.9 % 100 mL IVPB  Status:  Discontinued     500 mg 100 mL/hr over 60 Minutes Intravenous Every 12 hours 09/23/13 0753 09/27/13 0906   09/23/13 0900  levofloxacin (LEVAQUIN) IVPB 750 mg  Status:  Discontinued     750 mg 100 mL/hr over 90 Minutes Intravenous Every 48 hours 09/23/13 0735 09/28/13 0925   09/23/13 0900  aztreonam (AZACTAM) 1 g in dextrose 5 % 50 mL IVPB  Status:  Discontinued     1 g 100 mL/hr over 30 Minutes Intravenous 3 times per day 09/23/13 0753 09/27/13 0906   09/22/13 2130  aztreonam (AZACTAM) 2 g in dextrose 5 % 50 mL IVPB     2 g 100 mL/hr over 30 Minutes Intravenous  Once 09/22/13 2128 09/22/13 2333   09/22/13 2130  vancomycin (VANCOCIN) IVPB 1000 mg/200 mL premix     1,000 mg 200 mL/hr over 60 Minutes Intravenous  Once 09/22/13 2128 09/23/13 0104       PHYSICAL EXAM: Vital signs in last 24 hours: Filed Vitals:   09/29/13 2239 09/30/13 0157 09/30/13 0400 09/30/13 0500  BP:  122/52  126/65  Pulse:  70  77  Temp:   97.3 F (36.3 C) 97.9 F (36.6 C)  TempSrc:   Oral Oral  Resp:  28  16  Height:      Weight:      SpO2: 99% 98%  100%    Weight change:  Filed Weights   09/23/13 0130 09/23/13 0400 09/25/13 0403  Weight: 70.2 kg (154 lb 12.2 oz) 68.9 kg (151 lb 14.4 oz) 66.6 kg (146 lb 13.2 oz)   Body mass index is 26.85 kg/(m^2).   Gen Exam: Awake and alert with clear speech.  Mildly tachypneic Neck: Supple, No JVD.  Chest: good air entry-still with mild bilat rhonchi-  CVS: S1 S2 Regular, no murmurs.    Abdomen: soft, BS +, non tender, non distended.  Extremities: no edema, lower extremities warm to touch. Neurologic: Non Focal.   Skin: No Rash.   Wounds: N/A.   Intake/Output from previous day:  Intake/Output Summary (Last 24 hours) at 09/30/13 1042 Last data filed at 09/30/13 0917  Gross per 24 hour  Intake  960 ml  Output   1400 ml  Net   -440 ml     LAB RESULTS: CBC  Recent Labs Lab 09/25/13 0130 09/26/13 0345 09/27/13 0336 09/29/13 0353 09/30/13 0343  WBC 24.4* 18.3* 13.7* 15.6* 19.6*  HGB 11.9* 11.4* 12.0 13.4 13.4  HCT 35.1* 35.0* 35.6* 38.8 39.8  PLT 276 240 255 265 255  MCV 83.6 84.3 83.6 81.7 81.9  MCH 28.3 27.5 28.2 28.2 27.6  MCHC 33.9 32.6 33.7 34.5 33.7  RDW 14.4 14.1 13.7 13.4 13.6    Chemistries   Recent Labs Lab 09/23/13 1901 09/24/13 0221 09/25/13 0130 09/26/13 0345 09/28/13 0421 09/30/13 0343  NA 133* 132* 136* 136* 135* 135*  K 2.7* 3.9 4.2 4.5 4.7 4.0  CL 90* 93* 98 102 97 97  CO2 19 22 22 21 27 21   GLUCOSE 230* 250* 237* 179* 157* 182*  BUN 23 23 34* 41* 30* 39*  CREATININE 1.00 1.00 1.04 1.10 0.93 1.28*  CALCIUM 9.4 8.6 8.6 8.4 8.8 9.5  MG 1.7  --   --   --   --   --     CBG:  Recent Labs Lab 09/29/13 0734 09/29/13 1142 09/29/13 1708 09/29/13 2218 09/30/13 0737  GLUCAP 166* 220* 148* 143* 154*    GFR Estimated Creatinine Clearance: 31.9 ml/min (by C-G formula based on Cr of 1.28).  Coagulation profile  Recent Labs Lab 09/23/13 2143  INR 1.11    Cardiac Enzymes  Recent Labs Lab 09/23/13 1901 09/24/13 0221 09/24/13 0705  TROPONINI 0.70* 0.85* 0.69*    No components found with this basename: POCBNP,  No results found for this basename: DDIMER,  in the last 72 hours No results found for this basename: HGBA1C,  in the last 72 hours No results found for this basename: CHOL, HDL, LDLCALC, TRIG, CHOLHDL, LDLDIRECT,  in the last 72 hours No results found for this basename: TSH, T4TOTAL, FREET3, T3FREE,  THYROIDAB,  in the last 72 hours No results found for this basename: VITAMINB12, FOLATE, FERRITIN, TIBC, IRON, RETICCTPCT,  in the last 72 hours No results found for this basename: LIPASE, AMYLASE,  in the last 72 hours  Urine Studies No results found for this basename: UACOL, UAPR, USPG, UPH, UTP, UGL, UKET, UBIL, UHGB, UNIT, UROB, ULEU, UEPI, UWBC, URBC, UBAC, CAST, CRYS, UCOM, BILUA,  in the last 72 hours  MICROBIOLOGY: Recent Results (from the past 240 hour(s))  MRSA PCR SCREENING     Status: None   Collection Time    09/23/13  1:28 AM      Result Value Ref Range Status   MRSA by PCR NEGATIVE  NEGATIVE Final   Comment:            The GeneXpert MRSA Assay (FDA     approved for NASAL specimens     only), is one component of a     comprehensive MRSA colonization     surveillance program. It is not     intended to diagnose MRSA     infection nor to guide or     monitor treatment for     MRSA infections.  CULTURE, BLOOD (ROUTINE X 2)     Status: None   Collection Time    09/23/13 10:00 AM      Result Value Ref Range Status   Specimen Description BLOOD LEFT ARM   Final   Special Requests BOTTLES DRAWN AEROBIC AND ANAEROBIC [facility]   Final   Culture  Setup  Time     Final   Value: 09/23/2013 13:20     Performed at [facility]   Culture     Final   Value: NO GROWTH 5 DAYS     Performed at [facility]   Report Status 09/29/2013 FINAL   Final  CULTURE, BLOOD (ROUTINE X 2)     Status: None   Collection Time    09/23/13 10:10 AM      Result Value Ref Range Status   Specimen Description BLOOD LEFT HAND   Final   Special Requests BOTTLES DRAWN AEROBIC AND ANAEROBIC 4CC   Final   Culture  Setup Time     Final   Value: 09/23/2013 13:21     Performed at [facility]   Culture     Final   Value: NO GROWTH 5 DAYS     Performed at [facility]   Report Status 09/29/2013 FINAL   Final    RADIOLOGY STUDIES/RESULTS: Dg Chest 2 View  09/22/2013    CLINICAL DATA:  Shortness of breath, pneumonia  EXAM: CHEST  2 VIEW  COMPARISON:  DG CHEST 1V PORT dated 08/16/2013  FINDINGS: There is no focal parenchymal opacity, pleural effusion, or pneumothorax. Stable cardiomegaly.  There is mild thoracic spine spondylosis.  IMPRESSION: No active cardiopulmonary disease.   Electronically Signed   By: Elige KoHetal  Patel   On: 09/22/2013 19:04   Dg Chest Port 1 View  09/23/2013   CLINICAL DATA:  Respiratory failure.  EXAM: PORTABLE CHEST - 1 VIEW  COMPARISON:  Chest x-ray 09/22/2013.  FINDINGS: Mild diffuse peribronchial cuffing throughout the lungs bilaterally. Patchy ill-defined airspace disease throughout the right mid to lower lung. No definite pleural effusions. No cephalization of the pulmonary vasculature. Heart size is mildly enlarged. The patient is rotated to the left on today's exam, resulting in distortion of the mediastinal contours and reduced diagnostic sensitivity and specificity for mediastinal pathology.  IMPRESSION: 1. The appearance the chest suggests bronchitis with multilobar bronchopneumonia in the right lung. 2. Mild cardiomegaly.   Electronically Signed   By: Trudie Reedaniel  Entrikin M.D.   On: 09/23/2013 06:49    Leroy SeaSINGH,Jelani Trueba K, MD  [facility] Pager:[phone]  If 7PM-7AM, please contact night-coverage [email or web address] Password [email or web address] 09/30/2013, 10:42 AM   LOS: 8 days

## 2013-09-30 NOTE — Progress Notes (Signed)
I have reviewed this note and agree with all findings. Kati Jamonte Curfman, PT, DPT Pager: [phone]   

## 2013-09-30 NOTE — Progress Notes (Signed)
Report received from [name], [place]. I agree with her assessment except for what is indicated. Will continue to monitor pt closely. Glenda Underwood, [name] I

## 2013-09-30 NOTE — Progress Notes (Signed)
Patient with labored breathing, RR 28-32. Scattered crackles with few wheezes. Patient stated she was having difficulty breathing.  122/52, 70,  98% 3L. Donnamarie PoagK. Kirby, NP notified. New orders to give Lasix 40mg  IV x1 and Duoneb. Will carry out orders and continue to monitor. Newman Niplayton, Camdon Saetern SeamaSheree

## 2013-10-01 LAB — CBC
HCT: 42.7 % (ref 36.0–46.0)
Hemoglobin: 14.6 g/dL (ref 12.0–15.0)
MCH: 28.1 pg (ref 26.0–34.0)
MCHC: 34.2 g/dL (ref 30.0–36.0)
MCV: 82.3 fL (ref 78.0–100.0)
PLATELETS: 266 10*3/uL (ref 150–400)
RBC: 5.19 MIL/uL — AB (ref 3.87–5.11)
RDW: 14 % (ref 11.5–15.5)
WBC: 16.2 10*3/uL — ABNORMAL HIGH (ref 4.0–10.5)

## 2013-10-01 LAB — BASIC METABOLIC PANEL
BUN: 33 mg/dL — ABNORMAL HIGH (ref 6–23)
CALCIUM: 9.1 mg/dL (ref 8.4–10.5)
CO2: 22 mEq/L (ref 19–32)
CREATININE: 1.15 mg/dL — AB (ref 0.50–1.10)
Chloride: 100 mEq/L (ref 96–112)
GFR calc Af Amer: 51 mL/min — ABNORMAL LOW (ref >90)
GFR, EST NON AFRICAN AMERICAN: 44 mL/min — AB (ref >90)
GLUCOSE: 177 mg/dL — AB (ref 70–99)
Potassium: 4.1 mEq/L (ref 3.7–5.3)
Sodium: 139 mEq/L (ref 137–147)

## 2013-10-01 LAB — GLUCOSE, CAPILLARY
GLUCOSE-CAPILLARY: 152 mg/dL — AB (ref 70–99)
Glucose-Capillary: 161 mg/dL — ABNORMAL HIGH (ref 70–99)

## 2013-10-01 MED ORDER — NYSTATIN 100000 UNIT/ML MT SUSP: 5.0000 mL | Freq: Four times a day (QID) | OROMUCOSAL | Status: DC

## 2013-10-01 MED ORDER — DILTIAZEM HCL ER COATED BEADS 120 MG PO CP24: 120.0000 mg | ORAL_CAPSULE | Freq: Every day | ORAL | Status: DC

## 2013-10-01 MED ORDER — FREESTYLE SYSTEM KIT: 1.0000 | PACK | Freq: Three times a day (TID) | Status: DC

## 2013-10-01 MED ORDER — POTASSIUM CHLORIDE CRYS ER 10 MEQ PO TBCR: 10.0000 meq | EXTENDED_RELEASE_TABLET | Freq: Every day | ORAL | Status: DC

## 2013-10-01 MED ORDER — LISINOPRIL 10 MG PO TABS: 10.0000 mg | ORAL_TABLET | Freq: Every day | ORAL | Status: DC

## 2013-10-01 MED ORDER — FUROSEMIDE 40 MG PO TABS
40.0000 mg | ORAL_TABLET | Freq: Every day | ORAL | Status: DC
  Administered 2013-10-01: 40 mg via ORAL
  Filled 2013-10-01: qty 1

## 2013-10-01 MED ORDER — PANTOPRAZOLE SODIUM 40 MG PO TBEC: 40.0000 mg | DELAYED_RELEASE_TABLET | Freq: Every day | ORAL | Status: DC

## 2013-10-01 MED ORDER — FUROSEMIDE 40 MG PO TABS: 40.0000 mg | ORAL_TABLET | Freq: Every day | ORAL | Status: DC

## 2013-10-01 MED ORDER — NYSTATIN 100000 UNIT/GM EX POWD: CUTANEOUS | Status: DC

## 2013-10-01 MED ORDER — LEVOFLOXACIN 750 MG PO TABS: 750.0000 mg | ORAL_TABLET | ORAL | Status: DC

## 2013-10-01 NOTE — Progress Notes (Signed)
Patient cleared for discharge. Packet copied and placed in [facility]. Patient's daughter to transport her.  Carmel Waddington C. Yeshua Stryker MSW, LCSW [phone]

## 2013-10-01 NOTE — Progress Notes (Signed)
Physical Therapy Treatment Patient Details Name: Glenda StairsHilda A Underwood MRN: [id number] DOB: 12/15/1932 Today's Date: 10/01/2013    History of Present Illness 80 yo female admitted with actue on chronic respiratory failure, A fib. hx of TIA, dementia, spinal stenosis    PT Comments    Pt progressing well with mobility.  Pt performed a few balance activities during gait as well as performed a couple exercises upon return to supine.  Pt reports fatigue today however very agreeable to activity as tolerated.  Pt to d/c back to facility today. \  Follow Up Recommendations  Home health PT;Supervision/Assistance - 24 hour     Equipment Recommendations  None recommended by PT    Recommendations for Other Services       Precautions / Restrictions Precautions Precautions: Fall Restrictions Weight Bearing Restrictions: No    Mobility  Bed Mobility Overal bed mobility: Needs Assistance Bed Mobility: Sit to Supine       Sit to supine: Supervision   General bed mobility comments: pt at EOB upon entering room with bed alarm going off  Transfers Overall transfer level: Needs assistance Equipment used: Rolling walker (2 wheeled) Transfers: Sit to/from Stand Sit to Stand: Min assist         General transfer comment: min assist to rise from bed however min guard thereafter, verbal cues for safety, cue to use grab bar with toilet transfers  Ambulation/Gait Ambulation/Gait assistance: Min guard Ambulation Distance (Feet): 400 Feet Assistive device: Rolling walker (2 wheeled) Gait Pattern/deviations: Step-through pattern Gait velocity: decr   General Gait Details: verbal cues for safety; no LOB with walker, increased activity tolerance, pt c/o fatigue today, pt weaved around tiles for 60 feet, and performed head turns for 20 feet, min/guard throughout   Underwood            Wheelchair Mobility    Modified Rankin (Stroke Patients Only)       Balance Overall balance  assessment: Needs assistance               Single Leg Stance - Right Leg: 10 Single Leg Stance - Left Leg: 5                Cognition Arousal/Alertness: Awake/alert Behavior During Therapy: WFL for tasks assessed/performed Overall Cognitive Status: History of cognitive impairments - at baseline                      Exercises General Exercises - Lower Extremity Short Arc Quad: AROM;Both;10 reps;Supine Heel Slides: AROM;Both;10 reps;Supine Hip ABduction/ADduction: AROM;Both;10 reps;Other (comment) (also pillow squeezes x10)    General Comments        Pertinent Vitals/Pain No c/o pain, activity as tolerated    Home Living                      Prior Function            PT Goals (current goals can now be found in the care plan section) Progress towards PT goals: Progressing toward goals    Frequency  Min 3X/week    PT Plan Current plan remains appropriate    End of Session Equipment Utilized During Treatment: Gait belt Activity Tolerance: Patient tolerated treatment well Patient left: with call bell/phone within reach;with bed alarm set;in bed     Time: [id number] PT Time Calculation (min): 25 min  Charges:  $Gait Training: 8-22 mins $Therapeutic Activity: 8-22 mins  G Codes:      Breven Guidroz,KATHrine E 2013-10-16, 10:59 AM Zenovia Jarred, PT, DPT Oct 16, 2013 Pager: [phone]

## 2013-10-01 NOTE — Progress Notes (Signed)
[facility].

## 2013-10-01 NOTE — Discharge Summary (Signed)
Glenda Underwood, is a 80 y.o. female  DOB [birth year]  MRN [id number].  Admission date:  09/22/2013  Admitting Physician  Acquanetta Chain, DO  Discharge Date:  10/01/2013   Primary MD  Nyoka Cowden, MD  Recommendations for primary care physician for things to follow:    CBC, BMP and 2 view chest x-ray in a week.  Monitor A1c and glycemic control, question undiagnosed type 2 diabetes mellitus   Admission Diagnosis  congestion   Discharge Diagnosis  congestion pneumonia  Principal Problem:   Acute and chronic respiratory failure Active Problems:   DIABETES MELLITUS, TYPE II   HYPERTENSION   CORONARY ARTERY DISEASE   GERD   Dementia   Acute diastolic CHF (congestive heart failure)   COPD with acute exacerbation   Respiratory failure, acute and chronic   Atrial fibrillation   NSTEMI (non-ST elevated myocardial infarction)      Past Medical History  Diagnosis Date  . CORONARY ARTERY DISEASE 07/20/2010    with stent  . DEPRESSION 07/20/2010  . DM (diabetes mellitus) type II controlled, neurological manifestation 07/20/2010  . GERD 07/20/2010  . HYPERLIPIDEMIA 07/20/2010  . HYPERTENSION 07/20/2010  . LOW BACK PAIN 07/20/2010  . OSTEOARTHRITIS 07/20/2010  . URINARY INCONTINENCE 07/20/2010  . TIA (transient ischemic attack)   . CAD (coronary artery disease)     Stenting 2008 (no details)  . Dementia   . Hypokalemia   . Spinal stenosis   . Tobacco abuse   . Varicose vein of leg     Past Surgical History  Procedure Laterality Date  . Abdominal hysterectomy    . Vitrectomy Left   . Total knee arthroplasty    . Coronary stent placement    . Varicose vein surgery       Discharge Condition: stable   Follow UP  Follow-up Information   Follow up with Nyoka Cowden, MD. Schedule an appointment  as soon as possible for a visit in 1 week.   Specialty:  Internal Medicine   Contact information:   [place] [phone]       Follow up with Minus Breeding, MD. Schedule an appointment as soon as possible for a visit in 1 week.   Specialty:  Cardiology   Contact information:   [address] [place] [address] [phone]       Follow up with [facility], MD. Schedule an appointment as soon as possible for a visit in 1 week.   Specialty:  Pulmonary Disease   Contact information:   [place] [address] [phone]         Discharge Instructions  and  Discharge Medications      Discharge Orders   Future Orders Complete By Expires   Diet - low sodium heart healthy  As directed    Discharge instructions  As directed    Comments:     Follow with Primary MD Nyoka Cowden, MD in 7 days    Accuchecks  4 times/day, Once in AM empty stomach and then before each meal. Log in all results and show them to your Prim.MD in 3 days. If any glucose reading is under 80 or above 300 call your Prim MD immidiately. Follow Low glucose instructions for glucose under 80 as instructed.   Get CBC, CMP, checked 7 days by Primary MD and again as instructed by your Primary MD. Get a 2 view Chest X ray done next visit .   Activity: As tolerated with Full fall precautions use walker/cane & assistance as needed   Disposition Home    Diet: Heart Healthy - Low Carb , Check your Weight same time everyday, if you gain over 2 pounds, or you develop in leg swelling, experience more shortness of breath or chest pain, call your Primary MD immediately. Follow Cardiac Low Salt Diet and 1.5 lit/day fluid restriction.   On your next visit with her primary care physician please Get Medicines reviewed and adjusted.  Please request your Prim.MD to go over all Hospital Tests and Procedure/Radiological results at the follow up,  please get all Hospital records sent to your Prim MD by signing hospital release before you go home.   If you experience worsening of your admission symptoms, develop shortness of breath, life threatening emergency, suicidal or homicidal thoughts you must seek medical attention immediately by calling 911 or calling your MD immediately  if symptoms less severe.  You Must read complete instructions/literature along with all the possible adverse reactions/side effects for all the Medicines you take and that have been prescribed to you. Take any new Medicines after you have completely understood and accpet all the possible adverse reactions/side effects.   Do not drive and provide baby sitting services if your were admitted for syncope or siezures until you have seen by Primary MD or a Neurologist and advised to do so again.  Do not drive when taking Pain medications.    Do not take more than prescribed Pain, Sleep and Anxiety Medications  Special Instructions: If you have smoked or chewed Tobacco  in the last 2 yrs please stop smoking, stop any regular Alcohol  and or any Recreational drug use.  Wear Seat belts while driving.   Please note  You were cared for by a hospitalist during your hospital stay. If you have any questions about your discharge medications or the care you received while you were in the hospital after you are discharged, you can call the unit and asked to speak with the hospitalist on call if the hospitalist that took care of you is not available. Once you are discharged, your primary care physician will handle any further medical issues. Please note that NO REFILLS for any discharge medications will be authorized once you are discharged, as it is imperative that you return to your primary care physician (or establish a relationship with a primary care physician if you do not have one) for your aftercare needs so that they can reassess your need for medications and monitor your  lab values.       Medication List    STOP taking these medications       metoprolol succinate 100 MG 24 hr tablet  Commonly known as:  TOPROL-XL      TAKE these medications       acetaminophen 325 MG tablet  Commonly known as:  TYLENOL  Take 650 mg by mouth every 4 (four) hours as needed for pain.     albuterol (2.5 MG/3ML)  0.083% nebulizer solution  Commonly known as:  PROVENTIL  Take 3 mLs (2.5 mg total) by nebulization every 4 (four) hours as needed for wheezing or shortness of breath.     aspirin 81 MG tablet  Take 81 mg by mouth daily.     cholecalciferol 1000 UNITS tablet  Commonly known as:  VITAMIN D  Take 1,000 Units by mouth daily.     cloNIDine 0.2 MG tablet  Commonly known as:  CATAPRES  Take 0.3 mg by mouth 2 (two) times daily. Takes 1.5 tablets     diltiazem 120 MG 24 hr capsule  Commonly known as:  CARDIZEM CD  Take 1 capsule (120 mg total) by mouth daily.     DULoxetine 60 MG capsule  Commonly known as:  CYMBALTA  Take 1 capsule (60 mg total) by mouth daily.     felodipine 10 MG 24 hr tablet  Commonly known as:  PLENDIL  Take 1 tablet (10 mg total) by mouth daily.     furosemide 40 MG tablet  Commonly known as:  LASIX  Take 1 tablet (40 mg total) by mouth daily.     glucose monitoring kit monitoring kit  1 each by Does not apply route 4 (four) times daily - after meals and at bedtime. 1 month Diabetic Testing Supplies for QAC-QHS accuchecks.Any brand OK     hydrALAZINE 50 MG tablet  Commonly known as:  APRESOLINE  Take 50 mg by mouth 4 (four) times daily.     levofloxacin 750 MG tablet  Commonly known as:  LEVAQUIN  Take 1 tablet (750 mg total) by mouth every other day.     lisinopril 10 MG tablet  Commonly known as:  PRINIVIL,ZESTRIL  Take 1 tablet (10 mg total) by mouth daily.  Start taking on:  10/07/2013     LORazepam 0.5 MG tablet  Commonly known as:  ATIVAN  Take 0.5 mg by mouth 3 (three) times daily.     NAMENDA XR 28 MG Cp24    Generic drug:  Memantine HCl ER  Take 28 mg by mouth daily.     nystatin 100000 UNIT/GM Powd  Apply to affected skin TID     nystatin 100000 UNIT/ML suspension  Commonly known as:  MYCOSTATIN  Take 5 mLs (500,000 Units total) by mouth 4 (four) times daily.     ondansetron 4 MG tablet  Commonly known as:  ZOFRAN  Take 4 mg by mouth every 6 (six) hours as needed for nausea or vomiting.     pantoprazole 40 MG tablet  Commonly known as:  PROTONIX  Take 1 tablet (40 mg total) by mouth daily.     potassium chloride 10 MEQ tablet  Commonly known as:  K-DUR,KLOR-CON  Take 1 tablet (10 mEq total) by mouth daily.     simvastatin 20 MG tablet  Commonly known as:  ZOCOR  Take 1 tablet (20 mg total) by mouth daily.     traMADol 50 MG tablet  Commonly known as:  ULTRAM  Take 50 mg by mouth every 6 (six) hours as needed (pain).     traZODone 50 MG tablet  Commonly known as:  DESYREL  Take 1 tablet (50 mg total) by mouth at bedtime and may repeat dose one time if needed.          Diet and Activity recommendation: See Discharge Instructions above   Consults obtained - Cards   Major procedures and Radiology Reports - PLEASE review detailed and  final reports for all details, in brief -     Echo  - Left ventricle: The cavity size was normal. There was mild concentric hypertrophy. Systolic function was mildly to moderately reduced. The estimated ejection fraction was in the range of 40% to 45%. There is hypokinesis of the mid-distalanteroseptal myocardium. Doppler parameters are consistent with abnormal left ventricular relaxation (grade 1 diastolic dysfunction). Doppler parameters are consistent with high ventricular filling pressure. - Aortic valve: Trivial regurgitation. - Left atrium: The atrium was mildly dilated. - Pulmonary arteries: PA peak pressure: 79m Hg (S). Impressions:  - When compared to 2/15, EF is reduced and anteroseptal wall motion abnormality is  new.      Dg Chest 2 View  09/29/2013   CLINICAL DATA:  Shortness of breath and wheezing.  EXAM: CHEST  2 VIEW  COMPARISON:  09/27/2013  FINDINGS: Both lungs are clear. Heart and mediastinum are within normal limits. Multilevel degenerative changes in the thoracic spine. No evidence for edema or pleural effusions. Densities at the left costophrenic angle likely represent overlying shadows.  IMPRESSION: No acute cardiopulmonary disease.   Electronically Signed   By: AMarkus DaftM.D.   On: 09/29/2013 11:10   Dg Chest 2 View  09/27/2013   CLINICAL DATA:  Shortness of breath.  EXAM: CHEST  2 VIEW  COMPARISON:  DG CHEST 1V PORT dated 09/24/2013  FINDINGS: Cardiomegaly. Normal pulmonary vascularity. Previously identified pulmonary edema has almost completely cleared. No pleural effusion or pneumothorax. No acute bony abnormality appear  IMPRESSION: Interim near-complete clearing of congestive heart failure and pulmonary edema .   Electronically Signed   By: TMarcello Moores Register   On: 09/27/2013 09:05   Dg Chest 2 View  09/22/2013   CLINICAL DATA:  Shortness of breath, pneumonia  EXAM: CHEST  2 VIEW  COMPARISON:  DG CHEST 1V PORT dated 08/16/2013  FINDINGS: There is no focal parenchymal opacity, pleural effusion, or pneumothorax. Stable cardiomegaly.  There is mild thoracic spine spondylosis.  IMPRESSION: No active cardiopulmonary disease.   Electronically Signed   By: HKathreen Devoid  On: 09/22/2013 19:04   Ct Chest W Contrast  09/23/2013   CLINICAL DATA:  Shortness of breath and cough.  EXAM: CT CHEST WITH CONTRAST  TECHNIQUE: Multidetector CT imaging of the chest was performed during intravenous contrast administration.  CONTRAST:  858mOMNIPAQUE IOHEXOL 300 MG/ML  SOLN  COMPARISON:  Insert priors  FINDINGS: No pathologically enlarged mediastinal, hilar or axillary lymph nodes. Atherosclerotic calcification of the arterial vasculature including extensive three-vessel involvement of the coronary arteries. Heart is  enlarged. No pericardial effusion.  Trace right pleural effusion. Image quality is degraded by respiratory motion. Patchy areas of ground-glass in the apices of both upper lobes, right greater than left. Patchy ground-glass airspace disease in the right lower lobe with consolidation dependently. Minimal volume loss in the left lower lobe with probable mild ground-glass in the left lower lobe (image 41). Airway is unremarkable.  Incidental imaging of the upper abdomen shows the visualized portions of the liver, adrenal glands, kidneys, spleen, pancreas, stomach and bowel to be grossly unremarkable. No upper abdominal adenopathy. No worrisome lytic or sclerotic lesions. Degenerative changes are seen in the spine.  IMPRESSION: 1. Patchy areas of ground-glass in the upper lobes and lower lobes, suspicious for multilobar pneumonia. Image quality is degraded by respiratory motion. 2. Trace right pleural fluid. 3. Extensive 3 vessel coronary artery calcification.   Electronically Signed   By: MeLorin Picket.D.  On: 09/23/2013 17:08   Dg Chest Port 1 View  09/24/2013   CLINICAL DATA:  Shortness of breath.  EXAM: PORTABLE CHEST - 1 VIEW  COMPARISON:  Chest x-ray and CT scan dated 09/23/2013  FINDINGS: There is persistent cardiomegaly with slight pulmonary vascular congestion. Increased density in the right has improved with some residual density at the right base. This may represent faint pulmonary edema. Left lung is clear.  IMPRESSION: Improved faint infiltrates on the right. I suspect the patient may have slight pulmonary edema.   Electronically Signed   By: Rozetta Nunnery M.D.   On: 09/24/2013 07:59   Dg Chest Port 1 View  09/23/2013   CLINICAL DATA:  Respiratory failure.  EXAM: PORTABLE CHEST - 1 VIEW  COMPARISON:  Chest x-ray 09/22/2013.  FINDINGS: Mild diffuse peribronchial cuffing throughout the lungs bilaterally. Patchy ill-defined airspace disease throughout the right mid to lower lung. No definite pleural  effusions. No cephalization of the pulmonary vasculature. Heart size is mildly enlarged. The patient is rotated to the left on today's exam, resulting in distortion of the mediastinal contours and reduced diagnostic sensitivity and specificity for mediastinal pathology.  IMPRESSION: 1. The appearance the chest suggests bronchitis with multilobar bronchopneumonia in the right lung. 2. Mild cardiomegaly.   Electronically Signed   By: Vinnie Langton M.D.   On: 09/23/2013 06:49   Dg Abd Portable 1v  09/23/2013   CLINICAL DATA:  Projectile vomiting.  EXAM: PORTABLE ABDOMEN - 1 VIEW  COMPARISON:  None.  FINDINGS: There is mild motion artifact. Gas is present in the multiple grossly nondilated loops of small large bowel throughout the abdomen and pelvis. No abnormal soft tissue calcification is identified. Multilevel osteophytosis is present in the visualized thoracic spine and lumbar spine.  IMPRESSION: No evidence of bowel obstruction.   Electronically Signed   By: Logan Bores   On: 09/23/2013 22:26    Micro Results      Recent Results (from the past 240 hour(s))  MRSA PCR SCREENING     Status: None   Collection Time    09/23/13  1:28 AM      Result Value Ref Range Status   MRSA by PCR NEGATIVE  NEGATIVE Final   Comment:            The GeneXpert MRSA Assay (FDA     approved for NASAL specimens     only), is one component of a     comprehensive MRSA colonization     surveillance program. It is not     intended to diagnose MRSA     infection nor to guide or     monitor treatment for     MRSA infections.  CULTURE, BLOOD (ROUTINE X 2)     Status: None   Collection Time    09/23/13 10:00 AM      Result Value Ref Range Status   Specimen Description BLOOD LEFT ARM   Final   Special Requests BOTTLES DRAWN AEROBIC AND ANAEROBIC [facility]   Final   Culture  Setup Time     Final   Value: 09/23/2013 13:20     Performed at [facility]   Culture     Final   Value: NO GROWTH 5 DAYS      Performed at [facility]   Report Status 09/29/2013 FINAL   Final  CULTURE, BLOOD (ROUTINE X 2)     Status: None   Collection Time    09/23/13 10:10  AM      Result Value Ref Range Status   Specimen Description BLOOD LEFT HAND   Final   Special Requests BOTTLES DRAWN AEROBIC AND ANAEROBIC 4CC   Final   Culture  Setup Time     Final   Value: 09/23/2013 13:21     Performed at [facility]   Culture     Final   Value: NO GROWTH 5 DAYS     Performed at [facility]   Report Status 09/29/2013 FINAL   Final  URINE CULTURE     Status: None   Collection Time    09/29/13 12:56 PM      Result Value Ref Range Status   Specimen Description URINE, CLEAN CATCH   Final   Special Requests NONE   Final   Culture  Setup Time     Final   Value: 09/29/2013 19:51     Performed at [place] [facility]     Final   Value: NO GROWTH     Performed at [facility]   Culture     Final   Value: NO GROWTH     Performed at [facility]   Report Status 09/30/2013 FINAL   Final     History of present illness and  Hospital Course:     Kindly see H&P for history of present illness and admission details, please review complete Labs, Consult reports and Test reports for all details in brief CONNER NEISS, is a 80 y.o. female, patient with history of   CAD, chronic pain from arthritis, spinal stenosis, unconfirmed COPD, chronic diastolic CHF, TIA, dementia, anxiety, type 2 diabetes mellitus under control, who was admitted to the hospital with chief complaints of shortness of breath and found to be in acute respiratory failure due to combination of pneumonia which was HCAP along with CHF decompensation. While in the hospital she also developed atrial fibrillation and non-STEMI, she was seen by cardiology, placed on appropriate antibiotics and diuretics with good results, now she shortness of breath free, pulse ox 100% on room air for the last 24 hours. Will be  discharged home with outpatient followup with PCP, cardiology and pulmonary.     HCAP  -Clinically much improved after empiric antibiotics, afebrile with improving leukocytosis will be discharged on a few more days of oral Levaquin, we'll request PCP to repeat CBC in 2 view chest x-ray in a week. .    Acute on Chr Diastolic and Systolic CHF Ef 96% (down from few mths ago)  -Seen by cardiology, diuresed aggressively and now about 7 L negative, no shortness of breath her oxygen demand at this time, will be placed on oral Lasix and potassium supplementation, salt and fluid restriction instructions given in writing, outpatient followup with cardiology post discharge. No ACE inhibitor currently due to mildly elevated creatinine, have given her ACE inhibitor to be started in the next few days. Creatinine should be baseline by then. Will follow with PCP thereafter also. No beta blocker due to recurrent bronchospasm with beta blocker.     COPD with acute Exacerbation  -Developed wheezing, could have underlying COPD undiagnosed due to smoking, now compensated of prednisone for the last few days, continue nebulizer treatments at home as needed, counseled to quit smoking, outpatient pulmonary followup post discharge recommended.     Afib RVR  -placed on cardizem CD on 3-26, NSR now. Seen by cardiology and recommended no anticoagulation per cardiology, Chads  Vas2 - 4. Continue to follow with cardiology in the outpatient setting post discharge.     Hx of CAD with mild non-STEMI  -hx of PCI in 2013 , NSTEMI c/w ASA, Statins, hold metoprolol given bronchospasm, Cards follow up in one week for possible outpatient stress    HTN  -Medications adjusted as above, will be discharged on combination of Cardizem, ACE inhibitor, hydralazine, diuretic clonidine.    Dyslipidemia  -c/w statins     Hx of Type 2 DM  -last A1C-7.2  -Sugars are stable once off of prednisone, have given her testing supplies,  requested to follow with PCP with Accu-Chek readings. If sugars continue to be on the higher side she might require an oral hypoglycemic agent.    Tobacco Abuse  -counseled extensively       Today   Subjective:   Wilson Sample today has no headache,no chest abdominal pain,no new weakness tingling or numbness, feels much better wants to go home today.    Objective:   Blood pressure 164/66, pulse 81, temperature 98.5 F (36.9 C), temperature source Oral, resp. rate 16, height '5\' 2"'  (1.575 m), weight 64.638 kg (142 lb 8 oz), SpO2 100.00%.   Intake/Output Summary (Last 24 hours) at 10/01/13 0845 Last data filed at 10/01/13 0600  Gross per 24 hour  Intake    546 ml  Output   2450 ml  Net  -1904 ml    Exam Awake Alert, Oriented *3, No new F.N deficits, Normal affect [place].AT,PERRAL Supple Neck,No JVD, No cervical lymphadenopathy appriciated.  Symmetrical Chest wall movement, Good air movement bilaterally, CTAB RRR,No Gallops,Rubs or new Murmurs, No Parasternal Heave +ve B.Sounds, Abd Soft, Non tender, No organomegaly appriciated, No rebound -guarding or rigidity. No Cyanosis, Clubbing or edema, No new Rash or bruise  Data Review   CBC w Diff: Lab Results  Component Value Date   WBC 16.2* 10/01/2013   HGB 14.6 10/01/2013   HCT 42.7 10/01/2013   PLT 266 10/01/2013   LYMPHOPCT 6* 08/16/2013   MONOPCT 5 08/16/2013   EOSPCT 0 08/16/2013   BASOPCT 0 08/16/2013    CMP: Lab Results  Component Value Date   NA 139 10/01/2013   K 4.1 10/01/2013   CL 100 10/01/2013   CO2 22 10/01/2013   BUN 33* 10/01/2013   CREATININE 1.15* 10/01/2013   PROT 7.6 09/23/2013   ALBUMIN 3.5 09/23/2013   BILITOT 0.6 09/23/2013   ALKPHOS 122* 09/23/2013   AST 34 09/23/2013   ALT 22 09/23/2013  .   Total Time in preparing paper work, data evaluation and todays exam - 35 minutes  Thurnell Lose M.D on 10/01/2013 at 8:45 AM  [place]  [phone]

## 2013-10-01 NOTE — Discharge Instructions (Signed)
Follow with Primary MD Rogelia BogaKWIATKOWSKI,PETER FRANK, MD in 7 days   Get CBC, CMP, checked 7 days by Primary MD and again as instructed by your Primary MD. Get a 2 view Chest X ray done next visit .  Accuchecks 4 times/day, Once in AM empty stomach and then before each meal. Log in all results and show them to your Prim.MD in 3 days. If any glucose reading is under 80 or above 300 call your Prim MD immidiately. Follow Low glucose instructions for glucose under 80 as instructed.   Activity: As tolerated with Full fall precautions use walker/cane & assistance as needed   Disposition Home    Diet: Heart Healthy - Low carb , Check your Weight same time everyday, if you gain over 2 pounds, or you develop in leg swelling, experience more shortness of breath or chest pain, call your Primary MD immediately. Follow Cardiac Low Salt Diet and 1.5 lit/day fluid restriction.   On your next visit with her primary care physician please Get Medicines reviewed and adjusted.  Please request your Prim.MD to go over all Hospital Tests and Procedure/Radiological results at the follow up, please get all Hospital records sent to your Prim MD by signing hospital release before you go home.   If you experience worsening of your admission symptoms, develop shortness of breath, life threatening emergency, suicidal or homicidal thoughts you must seek medical attention immediately by calling 911 or calling your MD immediately  if symptoms less severe.  You Must read complete instructions/literature along with all the possible adverse reactions/side effects for all the Medicines you take and that have been prescribed to you. Take any new Medicines after you have completely understood and accpet all the possible adverse reactions/side effects.   Do not drive and provide baby sitting services if your were admitted for syncope or siezures until you have seen by Primary MD or a Neurologist and advised to do so again.  Do not  drive when taking Pain medications.    Do not take more than prescribed Pain, Sleep and Anxiety Medications  Special Instructions: If you have smoked or chewed Tobacco  in the last 2 yrs please stop smoking, stop any regular Alcohol  and or any Recreational drug use.  Wear Seat belts while driving.   Please note  You were cared for by a hospitalist during your hospital stay. If you have any questions about your discharge medications or the care you received while you were in the hospital after you are discharged, you can call the unit and asked to speak with the hospitalist on call if the hospitalist that took care of you is not available. Once you are discharged, your primary care physician will handle any further medical issues. Please note that NO REFILLS for any discharge medications will be authorized once you are discharged, as it is imperative that you return to your primary care physician (or establish a relationship with a primary care physician if you do not have one) for your aftercare needs so that they can reassess your need for medications and monitor your lab values.

## 2013-10-01 NOTE — Progress Notes (Signed)
SUBJECTIVE: No chest pain or SOB this am.   Tele: NSR  BP 164/66  Pulse 81  Temp(Src) 98.5 F (36.9 C) (Oral)  Resp 16  Ht 5\' 2"  (1.575 m)  Wt 142 lb 8 oz (64.638 kg)  BMI 26.06 kg/m2  SpO2 100%  Intake/Output Summary (Last 24 hours) at 10/01/13 0715 Last data filed at 10/01/13 0015  Gross per 24 hour  Intake    520 ml  Output   2450 ml  Net  -1930 ml    PHYSICAL EXAM General: Well developed, well nourished, in no acute distress. Alert and oriented x 3.  Psych:  Good affect, responds appropriately Neck: No JVD. No masses noted.  Lungs: Clear bilaterally with no wheezes or rhonci noted.  Heart: RRR with no murmurs noted. Abdomen: Bowel sounds are present. Soft, non-tender.  Extremities: No lower extremity edema.   LABS: Basic Metabolic Panel:  Recent Labs  60/45/40 0343 10/01/13 0352  NA 135* 139  K 4.0 4.1  CL 97 100  CO2 21 22  GLUCOSE 182* 177*  BUN 39* 33*  CREATININE 1.28* 1.15*  CALCIUM 9.5 9.1   CBC:  Recent Labs  09/30/13 0343 10/01/13 0352  WBC 19.6* 16.2*  HGB 13.4 14.6  HCT 39.8 42.7  MCV 81.9 82.3  PLT 255 266   Current Meds: . aspirin EC  81 mg Oral Daily  . atorvastatin  10 mg Oral q1800  . cholecalciferol  1,000 Units Oral Daily  . clonazePAM  1 mg Oral Daily  . diltiazem  120 mg Oral Daily  . docusate sodium  200 mg Oral BID  . DULoxetine  60 mg Oral Daily  . heparin subcutaneous  5,000 Units Subcutaneous 3 times per day  . hydrALAZINE  25 mg Oral 3 times per day  . insulin aspart  0-9 Units Subcutaneous TID WC & HS  . ipratropium  0.5 mg Nebulization Once  . levofloxacin  750 mg Oral Q48H  . Memantine HCl ER  28 mg Oral Daily  . metoCLOPramide (REGLAN) injection  5 mg Intravenous 4 times per day  . nystatin  5 mL Oral QID  . nystatin   Topical TID  . pantoprazole  40 mg Oral Daily  . polyethylene glycol  17 g Oral Daily  . potassium chloride  20 mEq Oral Daily  . senna  1 tablet Oral BID  . sodium chloride  3 mL  Intravenous Q12H  . traZODone  50 mg Oral QHS,MR X 1     ASSESSMENT AND PLAN:  1. Atrial fibrillation: Secondary to acute pulmonary process. She is maintaining NSR on Cardizem CD 120 mg daily. This is apparently her first episode of documented atrial fibrillation which was in the setting of acute illness. Her CHADs2Vasc is 4 but given she has not had any episodes prior to this and this episode was in the setting of an acute illness, will not start long term anticoagulation. She will need an outpatient event monitor to assess for silent PAF.   2. Elevated troponin: known CAD. Elevated troponin likely secondary to her acute illness. 2D echo showed EF 40-45% with hypokinesis of the mid and distal anteroseptum. This is a new finding since echo in 08/2013 and 06/2012. She has not had any chest pain since admission. Continue ASA. Will need outpatient stress myoview.   3. Acute on chronic diastolic CHF: Would continue Lasix 40 mg po Qdaily. Net negative 1900 cc last 24 hours, 7.1 liters  since admission.   Her cardiac issues are stable at this time. She has had great improvement in overall clinical picture. She will need f/u after discharge with Dr. Antoine PocheHochrein and will need an outpatient stress myoview and event monitor which can be arranged at the time of her follow up visit. Will sign off for now. Please page me with questions. [phone]    Harold Mattes  3/31/20157:15 AM

## 2013-10-10 ENCOUNTER — Emergency Department (HOSPITAL_COMMUNITY): Payer: Medicare Other

## 2013-10-10 ENCOUNTER — Encounter (HOSPITAL_COMMUNITY): Payer: Self-pay | Admitting: Emergency Medicine

## 2013-10-10 ENCOUNTER — Emergency Department (HOSPITAL_COMMUNITY)
Admission: EM | Admit: 2013-10-10 | Discharge: 2013-10-10 | Disposition: A | Discharge status: Home / Self Care | Payer: Medicare Other | Attending: Emergency Medicine | Admitting: Emergency Medicine

## 2013-10-10 DIAGNOSIS — F17200 Nicotine dependence, unspecified, uncomplicated: Secondary | ICD-10-CM | POA: Insufficient documentation

## 2013-10-10 DIAGNOSIS — M542 Cervicalgia: Secondary | ICD-10-CM

## 2013-10-10 DIAGNOSIS — M79609 Pain in unspecified limb: Secondary | ICD-10-CM | POA: Insufficient documentation

## 2013-10-10 DIAGNOSIS — F3289 Other specified depressive episodes: Secondary | ICD-10-CM | POA: Insufficient documentation

## 2013-10-10 DIAGNOSIS — F329 Major depressive disorder, single episode, unspecified: Secondary | ICD-10-CM | POA: Insufficient documentation

## 2013-10-10 DIAGNOSIS — I10 Essential (primary) hypertension: Secondary | ICD-10-CM | POA: Insufficient documentation

## 2013-10-10 DIAGNOSIS — M4800 Spinal stenosis, site unspecified: Secondary | ICD-10-CM | POA: Insufficient documentation

## 2013-10-10 DIAGNOSIS — Z880 Allergy status to penicillin: Secondary | ICD-10-CM | POA: Insufficient documentation

## 2013-10-10 DIAGNOSIS — M1990 Unspecified osteoarthritis, unspecified site: Secondary | ICD-10-CM | POA: Insufficient documentation

## 2013-10-10 DIAGNOSIS — Z8673 Personal history of transient ischemic attack (TIA), and cerebral infarction without residual deficits: Secondary | ICD-10-CM | POA: Insufficient documentation

## 2013-10-10 DIAGNOSIS — E785 Hyperlipidemia, unspecified: Secondary | ICD-10-CM | POA: Insufficient documentation

## 2013-10-10 DIAGNOSIS — Z9861 Coronary angioplasty status: Secondary | ICD-10-CM | POA: Insufficient documentation

## 2013-10-10 DIAGNOSIS — E1149 Type 2 diabetes mellitus with other diabetic neurological complication: Secondary | ICD-10-CM | POA: Insufficient documentation

## 2013-10-10 DIAGNOSIS — Z7982 Long term (current) use of aspirin: Secondary | ICD-10-CM | POA: Insufficient documentation

## 2013-10-10 DIAGNOSIS — I2510 Atherosclerotic heart disease of native coronary artery without angina pectoris: Secondary | ICD-10-CM | POA: Insufficient documentation

## 2013-10-10 DIAGNOSIS — M791 Myalgia, unspecified site: Secondary | ICD-10-CM

## 2013-10-10 DIAGNOSIS — Z79899 Other long term (current) drug therapy: Secondary | ICD-10-CM | POA: Insufficient documentation

## 2013-10-10 DIAGNOSIS — K219 Gastro-esophageal reflux disease without esophagitis: Secondary | ICD-10-CM | POA: Insufficient documentation

## 2013-10-10 MED ORDER — CYCLOBENZAPRINE HCL 10 MG PO TABS
5.0000 mg | ORAL_TABLET | Freq: Once | ORAL | Status: AC
  Administered 2013-10-10: 16:00:00 via ORAL
  Filled 2013-10-10: qty 1

## 2013-10-10 MED ORDER — OXYCODONE-ACETAMINOPHEN 5-325 MG PO TABS
1.0000 | ORAL_TABLET | Freq: Once | ORAL | Status: AC
  Administered 2013-10-10: 1 via ORAL
  Filled 2013-10-10: qty 1

## 2013-10-10 MED ORDER — MORPHINE SULFATE 4 MG/ML IJ SOLN
6.0000 mg | Freq: Once | INTRAMUSCULAR | Status: AC
  Administered 2013-10-10: 6 mg via INTRAMUSCULAR
  Filled 2013-10-10: qty 2

## 2013-10-10 NOTE — ED Notes (Signed)
Bed: WA21 Expected date:  Expected time:  Means of arrival:  Comments: ems- 80 yo F, right neck and right arm pain
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
## 2013-10-10 NOTE — Discharge Instructions (Signed)
Please call your doctor for a followup appointment within 24-48 hours. When you talk to your doctor please let them know that you were seen in the emergency department and have them acquire all of your records so that they can discuss the findings with you and formulate a treatment plan to fully care for your new and ongoing problems. Please call and set-up an appointment with primary care provider to be re-assessed, may need MRI of the neck to be performed as an outpatient in the near future if pain continues Please call and set-up an appointment with neurosurgery regarding neck pain Please rest and stay hydrated Please avoid any physical or strenuous activity Please apply heat and massage  Please continue to monitor symptoms and if symptoms are to worsen or change (fever greater than 101, chills, sweating, chest pain, shortness of breath, difficulty breathing, abdominal pain, weakness, numbness, tingling, loss of sensation to the arm, fall, injury, worsening or changes to neck pain, sudden loss of vision, blurred vision) please report back to the ED immediately   Cervical Strain and Sprain (Whiplash) with Rehab Cervical strain and sprains are injuries that commonly occur with "whiplash" injuries. Whiplash occurs when the neck is forcefully whipped backward or forward, such as during a motor vehicle accident. The muscles, ligaments, tendons, discs and nerves of the neck are susceptible to injury when this occurs. SYMPTOMS   Pain or stiffness in the front and/or back of neck  Symptoms may present immediately or up to 24 hours after injury.  Dizziness, headache, nausea and vomiting.  Muscle spasm with soreness and stiffness in the neck.  Tenderness and swelling at the injury site. CAUSES  Whiplash injuries often occur during contact sports or motor vehicle accidents.  RISK INCREASES WITH:  Osteoarthritis of the spine.  Situations that make head or neck accidents or trauma more  likely.  High-risk sports (football, rugby, wrestling, hockey, auto racing, gymnastics, diving, contact karate or boxing).  Poor strength and flexibility of the neck.  Previous neck injury.  Poor tackling technique.  Improperly fitted or padded equipment. PREVENTION  Learn and use proper technique (avoid tackling with the head, spearing and head-butting; use proper falling techniques to avoid landing on the head).  Warm up and stretch properly before activity.  Maintain physical fitness:  Strength, flexibility and endurance.  Cardiovascular fitness.  Wear properly fitted and padded protective equipment, such as padded soft collars, for participation in contact sports. PROGNOSIS  Recovery for cervical strain and sprain injuries is dependent on the extent of the injury. These injuries are usually curable in 1 week to 3 months with appropriate treatment.  RELATED COMPLICATIONS   Temporary numbness and weakness may occur if the nerve roots are damaged, and this may persist until the nerve has completely healed.  Chronic pain due to frequent recurrence of symptoms.  Prolonged healing, especially if activity is resumed too soon (before complete recovery). TREATMENT  Treatment initially involves the use of ice and medication to help reduce pain and inflammation. It is also important to perform strengthening and stretching exercises and modify activities that worsen symptoms so the injury does not get worse. These exercises may be performed at home or with a therapist. For patients who experience severe symptoms, a soft padded collar may be recommended to be worn around the neck.  Improving your posture may help reduce symptoms. Posture improvement includes pulling your chin and abdomen in while sitting or standing. If you are sitting, sit in a firm chair with  your buttocks against the back of the chair. While sleeping, try replacing your pillow with a small towel rolled to 2 inches in  diameter, or use a cervical pillow or soft cervical collar. Poor sleeping positions delay healing.  For patients with nerve root damage, which causes numbness or weakness, the use of a cervical traction apparatus may be recommended. Surgery is rarely necessary for these injuries. However, cervical strain and sprains that are present at birth (congenital) may require surgery. MEDICATION   If pain medication is necessary, nonsteroidal anti-inflammatory medications, such as aspirin and ibuprofen, or other minor pain relievers, such as acetaminophen, are often recommended.  Do not take pain medication for 7 days before surgery.  Prescription pain relievers may be given if deemed necessary by your caregiver. Use only as directed and only as much as you need. HEAT AND COLD:   Cold treatment (icing) relieves pain and reduces inflammation. Cold treatment should be applied for 10 to 15 minutes every 2 to 3 hours for inflammation and pain and immediately after any activity that aggravates your symptoms. Use ice packs or an ice massage.  Heat treatment may be used prior to performing the stretching and strengthening activities prescribed by your caregiver, physical therapist, or athletic trainer. Use a heat pack or a warm soak. SEEK MEDICAL CARE IF:   Symptoms get worse or do not improve in 2 weeks despite treatment.  New, unexplained symptoms develop (drugs used in treatment may produce side effects). EXERCISES RANGE OF MOTION (ROM) AND STRETCHING EXERCISES - Cervical Strain and Sprain These exercises may help you when beginning to rehabilitate your injury. In order to successfully resolve your symptoms, you must improve your posture. These exercises are designed to help reduce the forward-head and rounded-shoulder posture which contributes to this condition. Your symptoms may resolve with or without further involvement from your physician, physical therapist or athletic trainer. While completing these  exercises, remember:   Restoring tissue flexibility helps normal motion to return to the joints. This allows healthier, less painful movement and activity.  An effective stretch should be held for at least 20 seconds, although you may need to begin with shorter hold times for comfort.  A stretch should never be painful. You should only feel a gentle lengthening or release in the stretched tissue. STRETCH- Axial Extensors  Lie on your back on the floor. You may bend your knees for comfort. Place a rolled up hand towel or dish towel, about 2 inches in diameter, under the part of your head that makes contact with the floor.  Gently tuck your chin, as if trying to make a "double chin," until you feel a gentle stretch at the base of your head.  Hold __________ seconds. Repeat __________ times. Complete this exercise __________ times per day.  STRETECH - Axial Extension   Stand or sit on a firm surface. Assume a good posture: chest up, shoulders drawn back, abdominal muscles slightly tense, knees unlocked (if standing) and feet hip width apart.  Slowly retract your chin so your head slides back and your chin slightly lowers.Continue to look straight ahead.  You should feel a gentle stretch in the back of your head. Be certain not to feel an aggressive stretch since this can cause headaches later.  Hold for __________ seconds. Repeat __________ times. Complete this exercise __________ times per day. STRETCH  Cervical Side Bend   Stand or sit on a firm surface. Assume a good posture: chest up, shoulders drawn back, abdominal muscles  slightly tense, knees unlocked (if standing) and feet hip width apart.  Without letting your nose or shoulders move, slowly tip your right / left ear to your shoulder until your feel a gentle stretch in the muscles on the opposite side of your neck.  Hold __________ seconds. Repeat __________ times. Complete this exercise __________ times per day. STRETCH   Cervical Rotators   Stand or sit on a firm surface. Assume a good posture: chest up, shoulders drawn back, abdominal muscles slightly tense, knees unlocked (if standing) and feet hip width apart.  Keeping your eyes level with the ground, slowly turn your head until you feel a gentle stretch along the back and opposite side of your neck.  Hold __________ seconds. Repeat __________ times. Complete this exercise __________ times per day. RANGE OF MOTION - Neck Circles   Stand or sit on a firm surface. Assume a good posture: chest up, shoulders drawn back, abdominal muscles slightly tense, knees unlocked (if standing) and feet hip width apart.  Gently roll your head down and around from the back of one shoulder to the back of the other. The motion should never be forced or painful.  Repeat the motion 10-20 times, or until you feel the neck muscles relax and loosen. Repeat __________ times. Complete the exercise __________ times per day. STRENGTHENING EXERCISES - Cervical Strain and Sprain These exercises may help you when beginning to rehabilitate your injury. They may resolve your symptoms with or without further involvement from your physician, physical therapist or athletic trainer. While completing these exercises, remember:   Muscles can gain both the endurance and the strength needed for everyday activities through controlled exercises.  Complete these exercises as instructed by your physician, physical therapist or athletic trainer. Progress the resistance and repetitions only as guided.  You may experience muscle soreness or fatigue, but the pain or discomfort you are trying to eliminate should never worsen during these exercises. If this pain does worsen, stop and make certain you are following the directions exactly. If the pain is still present after adjustments, discontinue the exercise until you can discuss the trouble with your clinician. STRENGTH Cervical Flexors, Isometric  Face  a wall, standing about 6 inches away. Place a small pillow, a ball about 6-8 inches in diameter, or a folded towel between your forehead and the wall.  Slightly tuck your chin and gently push your forehead into the soft object. Push only with mild to moderate intensity, building up tension gradually. Keep your jaw and forehead relaxed.  Hold 10 to 20 seconds. Keep your breathing relaxed.  Release the tension slowly. Relax your neck muscles completely before you start the next repetition. Repeat __________ times. Complete this exercise __________ times per day. STRENGTH- Cervical Lateral Flexors, Isometric   Stand about 6 inches away from a wall. Place a small pillow, a ball about 6-8 inches in diameter, or a folded towel between the side of your head and the wall.  Slightly tuck your chin and gently tilt your head into the soft object. Push only with mild to moderate intensity, building up tension gradually. Keep your jaw and forehead relaxed.  Hold 10 to 20 seconds. Keep your breathing relaxed.  Release the tension slowly. Relax your neck muscles completely before you start the next repetition. Repeat __________ times. Complete this exercise __________ times per day. STRENGTH  Cervical Extensors, Isometric   Stand about 6 inches away from a wall. Place a small pillow, a ball about 6-8 inches in  diameter, or a folded towel between the back of your head and the wall.  Slightly tuck your chin and gently tilt your head back into the soft object. Push only with mild to moderate intensity, building up tension gradually. Keep your jaw and forehead relaxed.  Hold 10 to 20 seconds. Keep your breathing relaxed.  Release the tension slowly. Relax your neck muscles completely before you start the next repetition. Repeat __________ times. Complete this exercise __________ times per day. POSTURE AND BODY MECHANICS CONSIDERATIONS - Cervical Strain and Sprain Keeping correct posture when sitting,  standing or completing your activities will reduce the stress put on different body tissues, allowing injured tissues a chance to heal and limiting painful experiences. The following are general guidelines for improved posture. Your physician or physical therapist will provide you with any instructions specific to your needs. While reading these guidelines, remember:  The exercises prescribed by your provider will help you have the flexibility and strength to maintain correct postures.  The correct posture provides the optimal environment for your joints to work. All of your joints have less wear and tear when properly supported by a spine with good posture. This means you will experience a healthier, less painful body.  Correct posture must be practiced with all of your activities, especially prolonged sitting and standing. Correct posture is as important when doing repetitive low-stress activities (typing) as it is when doing a single heavy-load activity (lifting). PROLONGED STANDING WHILE SLIGHTLY LEANING FORWARD When completing a task that requires you to lean forward while standing in one place for a long time, place either foot up on a stationary 2-4 inch high object to help maintain the best posture. When both feet are on the ground, the low back tends to lose its slight inward curve. If this curve flattens (or becomes too large), then the back and your other joints will experience too much stress, fatigue more quickly and can cause pain.  RESTING POSITIONS Consider which positions are most painful for you when choosing a resting position. If you have pain with flexion-based activities (sitting, bending, stooping, squatting), choose a position that allows you to rest in a less flexed posture. You would want to avoid curling into a fetal position on your side. If your pain worsens with extension-based activities (prolonged standing, working overhead), avoid resting in an extended position such as  sleeping on your stomach. Most people will find more comfort when they rest with their spine in a more neutral position, neither too rounded nor too arched. Lying on a non-sagging bed on your side with a pillow between your knees, or on your back with a pillow under your knees will often provide some relief. Keep in mind, being in any one position for a prolonged period of time, no matter how correct your posture, can still lead to stiffness. WALKING Walk with an upright posture. Your ears, shoulders and hips should all line-up. OFFICE WORK When working at a desk, create an environment that supports good, upright posture. Without extra support, muscles fatigue and lead to excessive strain on joints and other tissues. CHAIR:  A chair should be able to slide under your desk when your back makes contact with the back of the chair. This allows you to work closely.  The chair's height should allow your eyes to be level with the upper part of your monitor and your hands to be slightly lower than your elbows.  Body position:  Your feet should make contact with the  floor. If this is not possible, use a foot rest.  Keep your ears over your shoulders. This will reduce stress on your neck and low back. Document Released: 06/20/2005 Document Revised: 10/15/2012 Document Reviewed: 10/02/2008 [facility]Exit[facility] Patient Information 2014 [place]ExitCare, [place].

## 2013-10-10 NOTE — ED Provider Notes (Signed)
Medical screening examination/treatment/procedure(s) were conducted as a shared visit with non-physician practitioner(s) and myself.  I personally evaluated the patient during the encounter. Pt presents with R neck/shoulder pain. On PE, Pt in NAD, denies fever, chills, SOB, CP. +ttp R paraspinal muscles, R trapezius. Pain worse w/ rightward rotation of head, and R lateral flexion of neck.  Suspect msk pain.  Plan for Continued pain control, screening EKG.   EKG Interpretation None        Shanna CiscoMegan E Docherty, MD 10/10/13 2103

## 2013-10-10 NOTE — ED Provider Notes (Signed)
[facility] [place]. Discussed case with Rhea BleacherJosh Geiple, PA-C at change in shift.   Nicholes StairsHilda A Gruel is a 80 y/o F with PMhx of CAD, DM, GERD, HLD, HTN, dementia coming from a facility regarding right sided neck pain and right shoulder pain that has been ongoing for the past couple of days. Patient was given tramadol at facility with minimal relief.  Plan: Control pain and plan for discharge.   Dg Cervical Spine Complete  10/10/2013   CLINICAL DATA:  A traumatic right neck and shoulder pain with limited range of motion  EXAM: CERVICAL SPINE  4+ VIEWS  COMPARISON:  None.  FINDINGS: The lateral film is nondiagnostic. The frontal film reveals considerable facet joint hypertrophy at multiple levels on the left. The oblique views reveal bony encroachment upon the neural foramina likely on the left. Where visualized the odontoid appears intact.  IMPRESSION: The lateral view is nondiagnostic. There are degenerative facet joint changes especially on the left at multiple levels. Further evaluation with MRI would be useful if the patient can tolerate the procedure.   Electronically Signed   By: David  [place]azil[place]   On: 10/10/2013 14:54   Dg Shoulder Right  10/10/2013   CLINICAL DATA:  Right shoulder pain and neck pain  EXAM: RIGHT SHOULDER - 2+ VIEW  COMPARISON:  None.  FINDINGS: There is no evidence of fracture or dislocation. There is no evidence of arthropathy or other focal bone abnormality. Soft tissues are unremarkable.  IMPRESSION: Negative.   Electronically Signed   By: Elige KoHetal  Patel   On: 10/10/2013 14:52   Ct Cervical Spine Wo Contrast  10/10/2013   CLINICAL DATA:  Neck pain without injury  EXAM: CT CERVICAL SPINE WITHOUT CONTRAST  TECHNIQUE: Multidetector CT imaging of the cervical spine was performed without intravenous contrast. Multiplanar CT image reconstructions were also generated.  COMPARISON:  Plain film from earlier in the same day.  FINDINGS: Seven cervical segments are well visualized.  Facet hypertrophic changes are noted. No acute fracture is seen. Disc space narrowing is noted at C6-7 with associated anterior and posterior osteophytes. Multilevel neural foraminal narrowing is noted. No acute disc pathology is seen. The surrounding soft tissues are within normal limits.  IMPRESSION: Multilevel degenerative changes without acute abnormality.   Electronically Signed   By: Alcide CleverMark  Lukens M.D.   On: 10/10/2013 18:53    Plain films of cervical spine noted degenerative disease. Right shoulder plain film negative.   5:58 PM Patient seen and assessed by this provider. Patient laying flat in bed. Reported that she has been having right sided neck pain described as a sharp pain worse with motion that has been ongoing for the past couple of days. Was given Tramadol at the facility she lives in. Alert. Heart rate and rhythm normal. Lungs CTAB. Negative bruits bilaterally to the carotid arteries. Negative deformities, swelling, erythema, inflammation noted to the right shoulder. Negative swelling to the right eye the neck. Discomfort upon palpation to the right side of the neck-muscular nature with decreased range of motion secondary to pain. Discomfort upon palpation to the right trapezius muscle as well as right shoulder circumferentially. Full range of motion to the right shoulder without difficulty noted. Strength intact with equal distribution. Fine motor skills intact.  6:11 PM Discussed case with attending physician who recommended CT neck to be performed and pain medications to be administered. Attending to see and assess patient. Attending saw and assessed patient. Reported that if CT scan of  cervical spine comes back negative patient to be discharged home.   Medications  oxyCODONE-acetaminophen (PERCOCET/ROXICET) 5-325 MG per tablet 1 tablet (1 tablet Oral Given 10/10/13 1348)  cyclobenzaprine (FLEXERIL) tablet 5 mg ( Oral Given 10/10/13 1618)  oxyCODONE-acetaminophen (PERCOCET/ROXICET)  5-325 MG per tablet 1 tablet (1 tablet Oral Given 10/10/13 1617)  morphine 4 MG/ML injection 6 mg (6 mg Intramuscular Given 10/10/13 1846)   Filed Vitals:   10/10/13 1259 10/10/13 1727  BP: 110/45 129/62  Pulse: 65 78  Temp: 98.3 F (36.8 C) 98 F (36.7 C)  TempSrc: Oral Oral  Resp: 20 20  SpO2: 99% 99%   Diagnoses that have been ruled out:  None  Diagnoses that are still under consideration:  None  Final diagnoses:  Neck pain  Muscular pain    7:38 PM Patient re-assessed. Patient's pain moderately controlled in the ED setting - patient found laying on the bed, drinking water and talking with daughter. CT scan of cervical spine noted multilevel degenerative changes without acute abnormalities. Pain medications administered in ED setting. Patient stable, afebrile. Patient seen and assessed by attending physician who cleared patient for discharge. Doubt carotid issue - suspicion to be more so muscular in nature. Physical exam noted full range of motion to the upper extremities bilaterally, strength intact with equal distribution and sensation intact-negative focal neurological deficits identified. Discharged patient. Discussed with patient to continue to take pain medications at home. Discussed with patient to rest and stay hydrated. Discussed with patient to apply warm compressions and massage. Discussed with patient to follow-up with PCP and neurosurgery - recommended MRI to be performed as outpatient, non-emergent today. Discussed with patient to closely monitor symptoms and if symptoms are to worsen or change to report back to the ED - strict return instructions given.  Patient agreed to plan of care, understood, all questions answered.   Raymon Mutton, PA-C 10/11/13 0405

## 2013-10-10 NOTE — ED Notes (Addendum)
Patient per ems - the patient has complaints of right shoulder and neck pain. Patient denies any injury.  She was given some tramadol  - 2 -3 hours ago  - the pain has not decreased since.

## 2013-10-10 NOTE — ED Provider Notes (Signed)
CSN: [id number]     Arrival date & time 10/10/13  1256 History   First MD Initiated Contact with Patient 10/10/13 1318     No chief complaint on file.    (Consider location/radiation/quality/duration/timing/severity/associated sxs/prior Treatment) HPI Comments: Patient with h/o spinal stenosis, presents with c/o R neck and arm pain. Patient states the pain began this morning without apparent injury. Pain is worse with movement and palpation. Pain initially radiated into her shoulder and down between her breasts but this pain is now gone. She was given tramadol by nursing staff without relief. EMS was called. Patient denies headache, vision change or loss. She denies weakness or pain in her upper extremities. She denies weakness or pain in her lower extremities. No current chest pain, shortness of breath, nausea, diaphoresis, palpitations. The onset of this condition was acute. The course is constant. Alleviating factors: none.     The history is provided by the patient and medical records.    Past Medical History  Diagnosis Date  . CORONARY ARTERY DISEASE 07/20/2010    with stent  . DEPRESSION 07/20/2010  . DM (diabetes mellitus) type II controlled, neurological manifestation 07/20/2010  . GERD 07/20/2010  . HYPERLIPIDEMIA 07/20/2010  . HYPERTENSION 07/20/2010  . LOW BACK PAIN 07/20/2010  . OSTEOARTHRITIS 07/20/2010  . URINARY INCONTINENCE 07/20/2010  . TIA (transient ischemic attack)   . CAD (coronary artery disease)     Stenting 2008 (no details)  . Dementia   . Hypokalemia   . Spinal stenosis   . Tobacco abuse   . Varicose vein of leg    Past Surgical History  Procedure Laterality Date  . Abdominal hysterectomy    . Vitrectomy Left   . Total knee arthroplasty    . Coronary stent placement    . Varicose vein surgery     Family History  Problem Relation Age of Onset  . Hypertension Mother   . Diabetes Mother   . Breast cancer Sister    History  Substance Use Topics  .  Smoking status: Current Every Day Smoker -- 0.50 packs/day    Types: Cigarettes  . Smokeless tobacco: Never Used     Comment: smokes 2-3 per day  . Alcohol Use: No   OB History   Grav Para Term Preterm Abortions TAB SAB Ect Mult Living                 Review of Systems  Constitutional: Negative for fever and fatigue.  HENT: Negative for rhinorrhea, sore throat and tinnitus.   Eyes: Negative for photophobia, pain, redness and visual disturbance.  Respiratory: Negative for cough and shortness of breath.   Cardiovascular: Negative for chest pain.  Gastrointestinal: Negative for nausea, vomiting, abdominal pain and diarrhea.  Genitourinary: Negative for dysuria.  Musculoskeletal: Positive for arthralgias, myalgias and neck pain. Negative for back pain and gait problem.  Skin: Negative for rash and wound.  Neurological: Negative for dizziness, weakness, light-headedness, numbness and headaches.  Psychiatric/Behavioral: Negative for confusion and decreased concentration.      Allergies  Penicillins  Home Medications   Current Outpatient Rx  Name  Route  Sig  Dispense  Refill  . acetaminophen (TYLENOL) 325 MG tablet   Oral   Take 650 mg by mouth every 4 (four) hours as needed for pain.         Marland Kitchen albuterol (PROVENTIL) (2.5 MG/3ML) 0.083% nebulizer solution   Nebulization   Take 3 mLs (2.5 mg total) by nebulization every 4 (four)  hours as needed for wheezing or shortness of breath.   75 mL   0   . aspirin 81 MG tablet   Oral   Take 81 mg by mouth daily.          . cholecalciferol (VITAMIN D) 1000 UNITS tablet   Oral   Take 1,000 Units by mouth daily.         . cloNIDine (CATAPRES) 0.2 MG tablet   Oral   Take 0.3 mg by mouth 2 (two) times daily. Takes 1.5 tablets         . diltiazem (CARDIZEM CD) 120 MG 24 hr capsule   Oral   Take 1 capsule (120 mg total) by mouth daily.   30 capsule   1   . DULoxetine (CYMBALTA) 60 MG capsule   Oral   Take 1 capsule (60  mg total) by mouth daily.   90 capsule   0   . felodipine (PLENDIL) 10 MG 24 hr tablet   Oral   Take 1 tablet (10 mg total) by mouth daily.   90 tablet   3   . furosemide (LASIX) 40 MG tablet   Oral   Take 1 tablet (40 mg total) by mouth daily.   30 tablet   0   . glucose monitoring kit (FREESTYLE) monitoring kit   Does not apply   1 each by Does not apply route 4 (four) times daily - after meals and at bedtime. 1 month Diabetic Testing Supplies for QAC-QHS accuchecks.Any brand OK   1 each   1   . hydrALAZINE (APRESOLINE) 50 MG tablet   Oral   Take 50 mg by mouth 4 (four) times daily.          Marland Kitchen levofloxacin (LEVAQUIN) 750 MG tablet   Oral   Take 1 tablet (750 mg total) by mouth every other day.   3 tablet   0   . lisinopril (PRINIVIL,ZESTRIL) 10 MG tablet   Oral   Take 1 tablet (10 mg total) by mouth daily.   30 tablet   0   . LORazepam (ATIVAN) 0.5 MG tablet   Oral   Take 0.5 mg by mouth 3 (three) times daily.          . Memantine HCl ER (NAMENDA XR) 28 MG CP24   Oral   Take 28 mg by mouth daily.         Marland Kitchen nystatin (MYCOSTATIN) 100000 UNIT/ML suspension   Oral   Take 5 mLs (500,000 Units total) by mouth 4 (four) times daily.   60 mL   0   . nystatin (MYCOSTATIN/NYSTOP) 100000 UNIT/GM POWD      Apply to affected skin TID   15 g   0   . ondansetron (ZOFRAN) 4 MG tablet   Oral   Take 4 mg by mouth every 6 (six) hours as needed for nausea or vomiting.         . pantoprazole (PROTONIX) 40 MG tablet   Oral   Take 1 tablet (40 mg total) by mouth daily.   30 tablet   0   . potassium chloride SA (K-DUR,KLOR-CON) 10 MEQ tablet   Oral   Take 1 tablet (10 mEq total) by mouth daily.   30 tablet   0   . simvastatin (ZOCOR) 20 MG tablet   Oral   Take 1 tablet (20 mg total) by mouth daily.   90 tablet   3   . traMADol (  ULTRAM) 50 MG tablet   Oral   Take 50 mg by mouth every 6 (six) hours as needed (pain).         . traZODone (DESYREL) 50  MG tablet   Oral   Take 1 tablet (50 mg total) by mouth at bedtime and may repeat dose one time if needed.   90 tablet   0    BP 110/45  Pulse 65  Temp(Src) 98.3 F (36.8 C) (Oral)  Resp 20  SpO2 99%  Physical Exam  Nursing note and vitals reviewed. Constitutional: She appears well-developed and well-nourished.  HENT:  Head: Normocephalic and atraumatic.  Mouth/Throat: Oropharynx is clear and moist.  Eyes: Conjunctivae are normal. Pupils are equal, round, and reactive to light. Right eye exhibits no discharge. Left eye exhibits no discharge.  Neck: Normal range of motion. Neck supple.  No carotid bruits. Tenderness to even light palpation over R trapezius.   Cardiovascular: Normal rate, regular rhythm and normal heart sounds.   No murmur heard. Pulmonary/Chest: Effort normal and breath sounds normal. No respiratory distress. She has no wheezes. She has no rales.  Abdominal: Soft. There is no tenderness. There is no rebound and no guarding.  Musculoskeletal: She exhibits no edema.       Right shoulder: Normal. She exhibits normal range of motion.       Left shoulder: Normal. She exhibits normal range of motion.       Right elbow: Normal.      Left elbow: Normal.       Right wrist: Normal.       Left wrist: Normal.       Cervical back: She exhibits tenderness. She exhibits normal range of motion, no bony tenderness and no swelling.       Back:       Right hand: She exhibits normal range of motion. Normal sensation noted. Normal strength noted.       Left hand: Normal. Normal sensation noted. Normal strength noted.  Neurological: She is alert.  Skin: Skin is warm and dry.  Psychiatric: She has a normal mood and affect.    ED Course  Procedures (including critical care time) Labs Review Labs Reviewed - No data to display Imaging Review Dg Cervical Spine Complete  10/10/2013   CLINICAL DATA:  A traumatic right neck and shoulder pain with limited range of motion  EXAM:  CERVICAL SPINE  4+ VIEWS  COMPARISON:  None.  FINDINGS: The lateral film is nondiagnostic. The frontal film reveals considerable facet joint hypertrophy at multiple levels on the left. The oblique views reveal bony encroachment upon the neural foramina likely on the left. Where visualized the odontoid appears intact.  IMPRESSION: The lateral view is nondiagnostic. There are degenerative facet joint changes especially on the left at multiple levels. Further evaluation with MRI would be useful if the patient can tolerate the procedure.   Electronically Signed   By: David  Martinique   On: 10/10/2013 14:54   Dg Shoulder Right  10/10/2013   CLINICAL DATA:  Right shoulder pain and neck pain  EXAM: RIGHT SHOULDER - 2+ VIEW  COMPARISON:  None.  FINDINGS: There is no evidence of fracture or dislocation. There is no evidence of arthropathy or other focal bone abnormality. Soft tissues are unremarkable.  IMPRESSION: Negative.   Electronically Signed   By: Kathreen Devoid   On: 10/10/2013 14:52     EKG Interpretation None      1:43 PM Patient seen and  examined. Work-up initiated. Medications ordered.   Vital signs reviewed and are as follows: Filed Vitals:   10/10/13 1259  BP: 110/45  Pulse: 65  Temp: 98.3 F (36.8 C)  Resp: 20   4:25 PM Pain not improved by Percocet x 1. Pt discussed with and seen by Dr. Tawnya Crook. Agree pain seems musculoskeletal by exam. Ordered additional Percocet + Flexeril. EKG ordered for completeness.   Handoff to [place] PA-C/[place] at shift change.   Plan: Re-eval when PO pain medications take effect. D/c home if improved.    MDM   Final diagnoses:  Neck pain   Patient with neck pain, reproducible on palpation and with movement -- suggesting MSK pain or radicular pain. Doubt dissection in neck. No new LE symptoms to suggest central cord involvement. No distal extremity pain. No fever, altered LOC to suggest meningitis. Plain film imaging here shows no acute fracture.      Carlisle Cater, PA-C 10/10/13 [phone]

## 2013-10-11 NOTE — ED Provider Notes (Signed)
Medical screening examination/treatment/procedure(s) were performed by non-physician practitioner and as supervising physician I was immediately available for consultation/collaboration.   EKG Interpretation None       Raeford RazorStephen Nymir Ringler, MD 10/11/13 1734

## 2013-10-17 ENCOUNTER — Encounter: Payer: Self-pay | Admitting: Internal Medicine

## 2013-10-17 ENCOUNTER — Ambulatory Visit (INDEPENDENT_AMBULATORY_CARE_PROVIDER_SITE_OTHER): Payer: Medicare Other | Admitting: Adult Health

## 2013-10-17 ENCOUNTER — Encounter: Payer: Self-pay | Admitting: Adult Health

## 2013-10-17 ENCOUNTER — Ambulatory Visit (INDEPENDENT_AMBULATORY_CARE_PROVIDER_SITE_OTHER): Payer: Medicare Other | Admitting: Internal Medicine

## 2013-10-17 VITALS — BP 106/54 | HR 63 | Temp 98.5°F | Ht 61.0 in | Wt 155.4 lb

## 2013-10-17 DIAGNOSIS — R05 Cough: Secondary | ICD-10-CM

## 2013-10-17 DIAGNOSIS — J9620 Acute and chronic respiratory failure, unspecified whether with hypoxia or hypercapnia: Secondary | ICD-10-CM

## 2013-10-17 DIAGNOSIS — R059 Cough, unspecified: Secondary | ICD-10-CM

## 2013-10-17 DIAGNOSIS — I2510 Atherosclerotic heart disease of native coronary artery without angina pectoris: Secondary | ICD-10-CM

## 2013-10-17 DIAGNOSIS — F17200 Nicotine dependence, unspecified, uncomplicated: Secondary | ICD-10-CM

## 2013-10-17 DIAGNOSIS — I10 Essential (primary) hypertension: Secondary | ICD-10-CM

## 2013-10-17 DIAGNOSIS — R058 Other specified cough: Secondary | ICD-10-CM

## 2013-10-17 MED ORDER — VALSARTAN 80 MG PO TABS: 80.0000 mg | ORAL_TABLET | Freq: Every day | ORAL | Status: DC

## 2013-10-17 NOTE — Progress Notes (Signed)
   Subjective:    Patient ID: Glenda StairsHilda A Underwood, female    DOB: 06/19/1933, 80 y.o.   MRN: [id number]  HPI    Review of Systems     Objective:   Physical Exam        Assessment & Plan:

## 2013-10-17 NOTE — Assessment & Plan Note (Signed)
Strongly doubt sign copd here > f/u pfts planned

## 2013-10-17 NOTE — Assessment & Plan Note (Signed)

## 2013-10-17 NOTE — Progress Notes (Signed)
Subjective:     Patient ID: Glenda Underwood, female   DOB: 11/15/1932 MRN: [id number]  HPI  [age] yowf active smoker seen for  initial pulmonary eval 10[facility]17/2013 [facility]     Admission date: 09/22/2013 Admitting Physician Edsel PetrinElizabeth L Golding, DO  Discharge Date: 10/01/2013  Primary MD Rogelia BogaKWIATKOWSKI,PETER FRANK, MD  Recommendations for primary care physician for things to follow:  CBC, BMP and 2 view chest x-ray in a week.  Monitor A1c and glycemic control, question undiagnosed type 2 diabetes mellitus  Admission Diagnosis congestion  Discharge Diagnosis congestion pneumonia   Acute and chronic respiratory failure   DIABETES MELLITUS, TYPE II  HYPERTENSION  CORONARY ARTERY DISEASE  GERD  Dementia  Acute diastolic CHF (congestive heart failure)  COPD with acute exacerbation  Respiratory failure, acute and chronic  Atrial fibrillation  NSTEMI (non-ST elevated myocardial infarction)    10/17/2013 1st [facility] office visit/ Machael Raine / on acei  Chief Complaint  Patient presents with  . Follow-up    [facility] PNA - reports breathing is doing well but still having some dry cough and dyspnea.  denies f/c/s  doe x walking to nurses to station = baseline, no better since admit, main unresolved problem dry mouth, dry cough with throat irritation despite ppi daily , no better with nebs, no worse when misses a dose  No obvious day to day or daytime variabilty or assoc   cp or chest tightness, subjective wheeze overt sinus or hb symptoms. No unusual exp hx or h/o childhood pna/ asthma or knowledge of premature birth.  Sleeping ok without nocturnal  or early am exacerbation  of respiratory  c/o's or need for noct saba. Also denies any obvious fluctuation of symptoms with weather or environmental changes or other aggravating or alleviating factors except as outlined above   Current Medications, Allergies, Complete Past Medical History, Past Surgical History, Family History, and Social  [facility].  ROS  The following are not active complaints unless bolded sore throat, dysphagia, dental problems, itching, sneezing,  nasal congestion or excess/ purulent secretions, ear ache,   fever, chills, sweats, unintended wt loss, pleuritic or exertional cp, hemoptysis,  orthopnea pnd or leg swelling, presyncope, palpitations, heartburn, abdominal pain, anorexia, nausea, vomiting, diarrhea  or change in bowel or urinary habits, change in stools or urine, dysuria,hematuria,  rash, arthralgias, visual complaints, headache, numbness weakness or ataxia or problems with walking or coordination,  change in mood/affect or memory.            Review of Systems     Objective:   Physical Exam     Wt Readings from Last 3 Encounters:  10/17/13 155 lb 6.4 oz (70.489 kg)  10/01/13 142 lb 8 oz (64.638 kg)  04/04/13 143 lb (64.864 kg)    Chronically ill hoarse  elderly wf walks with rolling walker  HEENT: nl dentition, turbinates, and orophanx. Nl external ear canals without cough reflex   NECK :  without JVD/Nodes/TM/ nl carotid upstrokes bilaterally   LUNGS: no acc muscle use, clear to A and P bilaterally without cough on insp or exp maneuvers   CV:  RRR  no s3 or murmur or increase in P2, no edema   ABD:  soft and nontender with nl excursion in the supine position. No bruits or organomegaly, bowel sounds nl  MS:  warm without deformities, calf tenderness, cyanosis or clubbing  SKIN: warm and dry without  lesions    NEURO:  alert, approp, no deficits  09/29/13  CXR No acute cardiopulmonary disease.      Assessment:

## 2013-10-17 NOTE — Patient Instructions (Addendum)
Stop lisinopril  Start Diovan 80 mg one daily   Continue nebulizer with albuterol every 4 hours as needed   GERD (REFLUX)  is an extremely common cause of respiratory symptoms, many times with no significant heartburn at all.    It can be treated with medication, but also with lifestyle changes including avoidance of late meals, excessive alcohol, smoking cessation, and avoid fatty foods, chocolate, peppermint, colas, red wine, and acidic juices such as orange juice.  NO MINT OR MENTHOL PRODUCTS SO NO COUGH DROPS  USE SUGARLESS CANDY INSTEAD (jolley ranchers or Stover's or Lifesavers) NO OIL BASED VITAMINS - use powdered substitutes.    Please schedule a follow up office visit in 6 weeks, call sooner if needed with pfts on return

## 2013-10-17 NOTE — Assessment & Plan Note (Signed)
Classic Upper airway cough syndrome, so named because it's frequently impossible to sort out how much is  CR/sinusitis with freq throat clearing (which can be related to primary GERD)   vs  causing  secondary (" extra esophageal")  GERD from wide swings in gastric pressure that occur with throat clearing, often  promoting self use of mint and menthol lozenges that reduce the lower esophageal sphincter tone and exacerbate the problem further in a cyclical fashion.   These are the same pts (now being labeled as having "irritable larynx syndrome" by some cough centers) who not infrequently have a history of having failed to tolerate ace inhibitors,  dry powder inhalers or biphosphonates or report having atypical reflux symptoms that don't respond to standard doses of PPI , and are easily confused as having aecopd or asthma flares by even experienced allergists/ pulmonologists.  rec  First trial off acei and dietary restrictions for GERD, then f/u with pfts to sort out whether has copd at all

## 2013-10-17 NOTE — Assessment & Plan Note (Signed)

## 2013-10-18 NOTE — Progress Notes (Signed)
Seen by Dr. Sherene SiresWert

## 2013-10-24 ENCOUNTER — Encounter: Payer: Medicare Other | Admitting: Physician Assistant

## 2013-11-12 ENCOUNTER — Ambulatory Visit (INDEPENDENT_AMBULATORY_CARE_PROVIDER_SITE_OTHER): Payer: Medicare Other | Admitting: Cardiology

## 2013-11-12 ENCOUNTER — Encounter: Payer: Self-pay | Admitting: Cardiology

## 2013-11-12 VITALS — BP 101/50 | HR 73 | Ht 60.0 in | Wt 154.1 lb

## 2013-11-12 DIAGNOSIS — I2510 Atherosclerotic heart disease of native coronary artery without angina pectoris: Secondary | ICD-10-CM

## 2013-11-12 DIAGNOSIS — I10 Essential (primary) hypertension: Secondary | ICD-10-CM

## 2013-11-12 DIAGNOSIS — I4891 Unspecified atrial fibrillation: Secondary | ICD-10-CM

## 2013-11-12 DIAGNOSIS — I214 Non-ST elevation (NSTEMI) myocardial infarction: Secondary | ICD-10-CM

## 2013-11-12 DIAGNOSIS — Z79899 Other long term (current) drug therapy: Secondary | ICD-10-CM

## 2013-11-12 LAB — BASIC METABOLIC PANEL
BUN: 22 mg/dL (ref 6–23)
CHLORIDE: 105 meq/L (ref 96–112)
CO2: 24 mEq/L (ref 19–32)
Calcium: 9.2 mg/dL (ref 8.4–10.5)
Creatinine, Ser: 1 mg/dL (ref 0.4–1.2)
GFR: 57.48 mL/min — ABNORMAL LOW (ref >60.00)
Glucose, Bld: 179 mg/dL — ABNORMAL HIGH (ref 70–99)
Potassium: 4 mEq/L (ref 3.5–5.1)
SODIUM: 138 meq/L (ref 135–145)

## 2013-11-12 MED ORDER — CLONIDINE HCL 0.2 MG PO TABS: 0.2000 mg | ORAL_TABLET | Freq: Two times a day (BID) | ORAL | Status: DC

## 2013-11-12 NOTE — Progress Notes (Signed)
HPI The patient presents for followup of CAD and follow up for respiratory fellow She has a history of coronary disease [place] but I never had these records. Last year she had negative stress test.  An echocardiogram done in December demonstrated a well preserved left ventricular systolic function.  However, she was in the hospital in March.  I reviewed these records.  She had respiratory failure.  Echocardiogram demonstrated her EF was 40-45% with some regional wall motion abnormalities. She had a troponin elevation that was felt to be secondary. She was thought to have COPD, a new diagnosis, as well as some heart failure. She had some transient atrial fibrillation. At her nursing home she's had no acute complaints. She unfortunately is smoking at a nursing home which is new for her. She's not having any chest pressure, neck or arm discomfort. She's not having any palpitations, presyncope or syncope. She's not having any PND or orthopnea. She does have dyspnea with exertion however.   Allergies  Allergen Reactions  . Penicillins Anaphylaxis    Current Outpatient Prescriptions  Medication Sig Dispense Refill  . acetaminophen (TYLENOL) 325 MG tablet Take 650 mg by mouth every 4 (four) hours as needed for pain.      Marland Kitchen. albuterol (PROVENTIL) (2.5 MG/3ML) 0.083% nebulizer solution Take 3 mLs (2.5 mg total) by nebulization every 4 (four) hours as needed for wheezing or shortness of breath.  75 mL  0  . aspirin 81 MG chewable tablet Chew 81 mg by mouth daily.      . busPIRone (BUSPAR) 10 MG tablet Take 10 mg by mouth 2 (two) times daily.      . cholecalciferol (VITAMIN D) 1000 UNITS tablet Take 1,000 Units by mouth daily.      . cloNIDine (CATAPRES) 0.2 MG tablet Take 0.3 mg by mouth 2 (two) times daily.       Marland Kitchen. diltiazem (CARDIZEM CD) 120 MG 24 hr capsule Take 1 capsule (120 mg total) by mouth daily.  30 capsule  1  . DULoxetine (CYMBALTA) 60 MG capsule Take 1 capsule  (60 mg total) by mouth daily.  90 capsule  0  . felodipine (PLENDIL) 10 MG 24 hr tablet Take 1 tablet (10 mg total) by mouth daily.  90 tablet  3  . furosemide (LASIX) 40 MG tablet Take 1 tablet (40 mg total) by mouth daily.  30 tablet  0  . insulin aspart (NOVOLOG) 100 UNIT/ML injection Inject 4 Units into the skin 3 (three) times daily before meals. Hold if BS is less than 125      . LORazepam (ATIVAN) 0.5 MG tablet Take 0.5 mg by mouth 2 (two) times daily.       . Memantine HCl ER (NAMENDA XR) 28 MG CP24 Take 28 mg by mouth daily.      . metFORMIN (GLUCOPHAGE) 500 MG tablet Take 1,000 mg by mouth 2 (two) times daily with a meal.       . ondansetron (ZOFRAN) 4 MG tablet Take 4 mg by mouth every 6 (six) hours as needed for nausea or vomiting.      . pantoprazole (PROTONIX) 40 MG tablet Take 1 tablet (40 mg total) by mouth daily.  30 tablet  0  . potassium chloride SA (K-DUR,KLOR-CON) 10 MEQ tablet Take 1 tablet (10 mEq total) by mouth daily.  30 tablet  0  . simvastatin (ZOCOR) 20 MG tablet Take 1 tablet (20 mg total) by mouth daily.  90 tablet  3  . traMADol (ULTRAM) 50 MG tablet Take 50 mg by mouth every 6 (six) hours as needed (pain).      . valsartan (DIOVAN) 80 MG tablet Take 1 tablet (80 mg total) by mouth daily.  30 tablet  11   No current facility-administered medications for this visit.    Past Medical History  Diagnosis Date  . CORONARY ARTERY DISEASE 07/20/2010    with stent  . DEPRESSION 07/20/2010  . DM (diabetes mellitus) type II controlled, neurological manifestation 07/20/2010  . GERD 07/20/2010  . HYPERLIPIDEMIA 07/20/2010  . HYPERTENSION 07/20/2010  . LOW BACK PAIN 07/20/2010  . OSTEOARTHRITIS 07/20/2010  . URINARY INCONTINENCE 07/20/2010  . TIA (transient ischemic attack)   . CAD (coronary artery disease)     Stenting 2008 (no details)  . Dementia   . Hypokalemia   . Spinal stenosis   . Tobacco abuse   . Varicose vein of leg     Past Surgical History  Procedure  Laterality Date  . Abdominal hysterectomy    . Vitrectomy Left   . Total knee arthroplasty    . Coronary stent placement    . Varicose vein surgery     ROS:  Overactive bladder.  Otherwise as stated in the HPI and negative for all other systems.  PHYSICAL EXAM BP 101/50  Pulse 73  Ht 5' (1.524 m)  Wt 154 lb 1.9 oz (69.908 kg)  BMI 30.10 kg/m2 GENERAL:  Frail appearing HEENT:  Pupils equal round and reactive, fundi not visualized, oral mucosa unremarkable, poor dentition.  NECK:  No jugular venous distention, waveform within normal limits, carotid upstroke brisk and symmetric, right bruit, no thyromegaly LUNGS:  Clear to auscultation bilaterally BACK:  No CVA tenderness CHEST:  Unremarkable HEART:  PMI not displaced or sustained,S1 and S2 within normal limits, no S3, no S4, no clicks, no rubs, no murmurs ABD:  Flat, positive bowel sounds normal in frequency in pitch, no bruits, no rebound, no guarding, no midline pulsatile mass, no hepatomegaly, no splenomegaly EXT:  2 plus pulses throughout, mild edema, no cyanosis no clubbing  EKG:  Sinus rhythm, rate 60, interventricular conduction delay, old anteroseptal infarct, QTC prolonged, rightward axis, no acute ST-T wave changes. No change from previous.  11/12/2013   ASSESSMENT AND PLAN  CAD Given the reduced ejection fraction and mild troponin elevation he hospital or I will check a stress perfusion study. She would not be a walk on a treadmill. She will have a [employer] Myoview.  HTN Her blood pressure is now well controlled. I will try to slowly reduce the clonidine and increase that to 0.2 mg twice daily. Hopefully we can wean this off as her blood pressure allows and perhaps slowly increase her ARB and beta blocker over time.  Diastolic heart failure She seems to be euvolemic.  I will order daily weights and is 2 g sodium restriction.  TOBACCO ABUSE:  She understands that she should stop smoking.   ATRIAL FIB:  She has had no  symptomatic recurrence of this.  CKD:  I will check a BMET

## 2013-11-12 NOTE — Patient Instructions (Signed)
Please decrease your Clonidine to 0.2 mg twice a day Continue all other medications as listed.  Please have blood work today (BMP)  Your physician has requested that you have a lexiscan myoview. For further information please visit [email or web address] Please follow instruction sheet, as given.  Please stay on a 4 gram NA diet.  Daily weights.  Follow up in 1 month with a PA/NP.

## 2013-11-27 ENCOUNTER — Encounter (HOSPITAL_COMMUNITY): Payer: Medicare Other

## 2013-11-28 ENCOUNTER — Ambulatory Visit (INDEPENDENT_AMBULATORY_CARE_PROVIDER_SITE_OTHER): Payer: Medicare Other | Admitting: Internal Medicine

## 2013-11-28 ENCOUNTER — Encounter (INDEPENDENT_AMBULATORY_CARE_PROVIDER_SITE_OTHER): Payer: Medicare Other

## 2013-11-28 ENCOUNTER — Encounter: Payer: Self-pay | Admitting: Internal Medicine

## 2013-11-28 VITALS — BP 112/58 | HR 71 | Temp 98.4°F | Wt 156.0 lb

## 2013-11-28 DIAGNOSIS — R058 Other specified cough: Secondary | ICD-10-CM

## 2013-11-28 DIAGNOSIS — R05 Cough: Secondary | ICD-10-CM

## 2013-11-28 DIAGNOSIS — I2510 Atherosclerotic heart disease of native coronary artery without angina pectoris: Secondary | ICD-10-CM

## 2013-11-28 DIAGNOSIS — I10 Essential (primary) hypertension: Secondary | ICD-10-CM

## 2013-11-28 DIAGNOSIS — R059 Cough, unspecified: Secondary | ICD-10-CM

## 2013-11-28 DIAGNOSIS — J9620 Acute and chronic respiratory failure, unspecified whether with hypoxia or hypercapnia: Secondary | ICD-10-CM

## 2013-11-28 DIAGNOSIS — F17200 Nicotine dependence, unspecified, uncomplicated: Secondary | ICD-10-CM

## 2013-11-28 NOTE — Progress Notes (Signed)
Subjective:     Patient ID: Glenda StairsHilda A Underwood, female   DOB: 10/07/1932 MRN: [id number]    Brief patient profile:  [id number] [id number] active smoker seen for  initial pulmonary eval 10/17/2013 for sob sp admit [facility] attributed in part to ACEi    History of Present Illness  Admission date: 09/22/2013 Admitting Physician Edsel PetrinElizabeth L Golding, DO  Discharge Date: 10/01/2013  Primary MD Rogelia BogaKWIATKOWSKI,PETER FRANK, MD   Admission Diagnosis congestion  Discharge Diagnosis congestion pneumonia   Acute and chronic respiratory failure   DIABETES MELLITUS, TYPE II  HYPERTENSION  CORONARY ARTERY DISEASE  GERD  Dementia  Acute diastolic CHF (congestive heart failure)  COPD with acute exacerbation  Respiratory failure, acute and chronic  Atrial fibrillation  NSTEMI (non-ST elevated myocardial infarction)    10/17/2013 1st [facility] office visit/ Tallula Grindle / on acei  Chief Complaint  Patient presents with  . Follow-up    [facility] PNA - reports breathing is doing well but still having some dry cough and dyspnea.  denies f/c/s  doe x walking to nurses to station = baseline, no better since admit, main unresolved problem dry mouth, dry cough with throat irritation despite ppi daily , no better with nebs, no worse when misses a dose rec Stop lisinopril Start Diovan 80 mg one daily  Continue nebulizer with albuterol every 4 hours as needed  GERD diet   11/28/2013 f/u ov/Glenda Underwood re: sob / cough now off acei Chief Complaint  Patient presents with  . Follow-up    Pt states that her breathing is doing well, and has very minimal cough. Unable to perform PFT today.       Not limited by breathing from desired activities  - no need for saba for breathing or tramadol for cough  No obvious day to day or daytime variabilty or assoc   cp or chest tightness, subjective wheeze overt sinus or hb symptoms. No unusual exp hx or h/o childhood pna/ asthma or knowledge of premature birth.  Sleeping ok without nocturnal  or early am  exacerbation  of respiratory  c/o's or need for noct saba. Also denies any obvious fluctuation of symptoms with weather or environmental changes or other aggravating or alleviating factors except as outlined above   Current Medications, Allergies, Complete Past Medical History, Past Surgical History, Family History, and Social History were reviewed in [facility] electronic medical record.  ROS  The following are not active complaints unless bolded sore throat, dysphagia, dental problems, itching, sneezing,  nasal congestion or excess/ purulent secretions, ear ache,   fever, chills, sweats, unintended wt loss, pleuritic or exertional cp, hemoptysis,  orthopnea pnd or leg swelling, presyncope, palpitations, heartburn, abdominal pain, anorexia, nausea, vomiting, diarrhea  or change in bowel or urinary habits, change in stools or urine, dysuria,hematuria,  rash, arthralgias, visual complaints, headache, numbness weakness or ataxia or problems with walking or coordination,  change in mood/affect or memory.                  Objective:   Physical Exam   11/28/2013        156  Wt Readings from Last 3 Encounters:  10/17/13 155 lb 6.4 oz (70.489 kg)  10/01/13 142 lb 8 oz (64.638 kg)  04/04/13 143 lb (64.864 kg)    Chronically ill  Now min hoarse  elderly wf walks with rolling walker  HEENT: nl dentition, turbinates, and orophanx. Nl external ear canals without cough reflex   NECK :  without JVD/Nodes/TM/ nl carotid upstrokes bilaterally   LUNGS: no acc muscle use, clear to A and P bilaterally without cough on insp or exp maneuvers   CV:  RRR  no s3 or murmur or increase in P2, no edema   ABD:  soft and nontender with nl excursion in the supine position. No bruits or organomegaly, bowel sounds nl  MS:  warm without deformities, calf tenderness, cyanosis or clubbing    09/29/13  CXR No acute cardiopulmonary disease.      Assessment:

## 2013-11-28 NOTE — Patient Instructions (Addendum)
The key is to stop smoking completely before smoking completely stops you!   Only use your albuterol as a rescue medication to be used if you can't catch your breath by resting or doing a relaxed purse lip breathing pattern.  - The less you use it, the better it will work when you need it. - Ok to use up to  every 4 hours    Pulmonary follow up is as needed

## 2013-11-30 NOTE — Assessment & Plan Note (Addendum)
Trial off acei 10/17/2013 due to ? Pseudoasthma > resolved on f/u 11/28/13   Would avoid this class entirely given the convincing results of a trial off and how well she's doing on diovan 80 mg daily

## 2013-11-30 NOTE — Assessment & Plan Note (Signed)
Unable to assess ? Of copd but my sense is that's it's not too late for her to quit.    > 3 min discussion  I reviewed the Flethcher curve with patient that basically indicates  if you quit smoking when your best day FEV1 is still well preserved (as is the case here)  it is highly unlikely you will progress to severe disease and informed the patient there was no medication on the market that has proven to change the curve or the likelihood of progression.  Therefore stopping smoking and maintaining abstinence is the most important aspect of care, not choice of inhalers or for that matter, doctors.

## 2013-11-30 NOTE — Assessment & Plan Note (Signed)
-   try off acei 10/17/2013 > resolved  Strongly rec stay off acei indefinitely given the dramatic results of a 6 week trial off  No pulmonary f/u needed

## 2013-12-09 ENCOUNTER — Other Ambulatory Visit: Payer: Self-pay | Admitting: Cardiology

## 2014-03-04 ENCOUNTER — Ambulatory Visit (INDEPENDENT_AMBULATORY_CARE_PROVIDER_SITE_OTHER): Payer: Medicare Other | Admitting: Cardiology

## 2014-03-04 ENCOUNTER — Encounter: Payer: Self-pay | Admitting: Cardiology

## 2014-03-04 VITALS — BP 138/52 | HR 62 | Ht 63.0 in | Wt 168.0 lb

## 2014-03-04 DIAGNOSIS — M79605 Pain in left leg: Secondary | ICD-10-CM

## 2014-03-04 DIAGNOSIS — E785 Hyperlipidemia, unspecified: Secondary | ICD-10-CM

## 2014-03-04 DIAGNOSIS — R600 Localized edema: Secondary | ICD-10-CM

## 2014-03-04 DIAGNOSIS — I4891 Unspecified atrial fibrillation: Secondary | ICD-10-CM

## 2014-03-04 DIAGNOSIS — M79609 Pain in unspecified limb: Secondary | ICD-10-CM

## 2014-03-04 DIAGNOSIS — R609 Edema, unspecified: Secondary | ICD-10-CM

## 2014-03-04 DIAGNOSIS — M79604 Pain in right leg: Secondary | ICD-10-CM | POA: Insufficient documentation

## 2014-03-04 DIAGNOSIS — I2510 Atherosclerotic heart disease of native coronary artery without angina pectoris: Secondary | ICD-10-CM

## 2014-03-04 DIAGNOSIS — I10 Essential (primary) hypertension: Secondary | ICD-10-CM

## 2014-03-04 NOTE — Assessment & Plan Note (Signed)
May be due to edema, may be neuropathy, will increase lasix for 2 days.  She has pedal pulses, though if pain continues we could get lower ext arterial dopplers.

## 2014-03-04 NOTE — Assessment & Plan Note (Signed)
bil lower ext.  Will increase lasix to 60 mg for 2 days then back to 40 mg daily.

## 2014-03-04 NOTE — Progress Notes (Signed)
03/04/2014   PCP: Rogelia Boga, MD   Chief Complaint  Patient presents with  . Follow-up    Pt. complains of swelling    Primary Cardiologist:Dr. Daiva Nakayama   HPI: 80 year old WF presents for followup of CAD and swelling.   She has a history of coronary disease with stenting in  previously but we never had these records. Last year she had negative stress test. An echocardiogram done in December demonstrated a well preserved left ventricular systolic function. However, she was in the hospital in March. She had respiratory failure. Echocardiogram demonstrated her EF was 40-45% with some regional wall motion abnormalities. She had a troponin elevation that was felt to be secondary. She was thought to have COPD, a new diagnosis, as well as some heart failure. She had some transient atrial fibrillation. At her nursing home she's had no acute complaints. She's not having any chest pressure, neck or arm discomfort. She's not having any palpitations, presyncope or syncope. She's not having any PND or orthopnea. She does have dyspnea with exertion however.  Walking in with walker she was SOB and even with talking she is SOB.    Pt complains today of leg pain, "since I was a girl." She has dementia and today is having difficulty remembering what she just told me.  Her leg pain starts when she gets up in the morning worse with walking.  When she rests it improves.  She does have edema. She is also diabetic so neuropathy may be playing a role.    On her last visit she was to have lexiscan myoview after elevated troponins in hospital and known CAD.  She never showed for the procedure.  We will schedule today.   Allergies  Allergen Reactions  . Penicillins Anaphylaxis    Current Outpatient Prescriptions  Medication Sig Dispense Refill  . acetaminophen (TYLENOL) 325 MG tablet Take 650 mg by mouth every 4 (four) hours as needed for pain.      Marland Kitchen albuterol (PROVENTIL)  (2.5 MG/3ML) 0.083% nebulizer solution Take 3 mLs (2.5 mg total) by nebulization every 4 (four) hours as needed for wheezing or shortness of breath.  75 mL  0  . aspirin 81 MG chewable tablet Chew 81 mg by mouth daily.      . busPIRone (BUSPAR) 10 MG tablet Take 10 mg by mouth 2 (two) times daily.      . cholecalciferol (VITAMIN D) 1000 UNITS tablet Take 1,000 Units by mouth daily.      . cloNIDine (CATAPRES) 0.2 MG tablet TAKE 1 TABLET BY MOUTH TWICE A DAY.  60 tablet  0  . diltiazem (CARDIZEM CD) 120 MG 24 hr capsule Take 1 capsule (120 mg total) by mouth daily.  30 capsule  1  . DULoxetine (CYMBALTA) 60 MG capsule Take 1 capsule (60 mg total) by mouth daily.  90 capsule  0  . felodipine (PLENDIL) 10 MG 24 hr tablet Take 1 tablet (10 mg total) by mouth daily.  90 tablet  3  . furosemide (LASIX) 40 MG tablet Take 1 tablet (40 mg total) by mouth daily.  30 tablet  0  . insulin aspart (NOVOLOG) 100 UNIT/ML injection Inject 4 Units into the skin 3 (three) times daily before meals. Hold if BS is less than 125      . LORazepam (ATIVAN) 0.5 MG tablet Take 0.5 mg by mouth 2 (two) times daily.       Marland Kitchen  Memantine HCl ER (NAMENDA XR) 28 MG CP24 Take 28 mg by mouth daily.      . metFORMIN (GLUCOPHAGE) 1000 MG tablet Take 1,000 mg by mouth 2 (two) times daily with a meal.      . ondansetron (ZOFRAN) 4 MG tablet Take 4 mg by mouth every 6 (six) hours as needed for nausea or vomiting.      . pantoprazole (PROTONIX) 40 MG tablet Take 1 tablet (40 mg total) by mouth daily.  30 tablet  0  . potassium chloride SA (K-DUR,KLOR-CON) 10 MEQ tablet Take 1 tablet (10 mEq total) by mouth daily.  30 tablet  0  . simvastatin (ZOCOR) 20 MG tablet Take 1 tablet (20 mg total) by mouth daily.  90 tablet  3  . traMADol (ULTRAM) 50 MG tablet Take 50 mg by mouth every 6 (six) hours as needed (pain).      . valsartan (DIOVAN) 80 MG tablet Take 1 tablet (80 mg total) by mouth daily.  30 tablet  11   No current facility-administered  medications for this visit.    Past Medical History  Diagnosis Date  . CORONARY ARTERY DISEASE 07/20/2010    with stent  . DEPRESSION 07/20/2010  . DM (diabetes mellitus) type II controlled, neurological manifestation 07/20/2010  . GERD 07/20/2010  . HYPERLIPIDEMIA 07/20/2010  . HYPERTENSION 07/20/2010  . LOW BACK PAIN 07/20/2010  . OSTEOARTHRITIS 07/20/2010  . URINARY INCONTINENCE 07/20/2010  . TIA (transient ischemic attack)   . CAD (coronary artery disease)     Stenting 2008 (no details)  . Dementia   . Hypokalemia   . Spinal stenosis   . Tobacco abuse   . Varicose vein of leg     Past Surgical History  Procedure Laterality Date  . Abdominal hysterectomy    . Vitrectomy Left   . Total knee arthroplasty    . Coronary stent placement    . Varicose vein surgery      ZOX:WRUEAVW:UJ colds or fevers,  weight is up 12 lbs. Skin:no rashes or ulcers HEENT:no blurred vision, no congestion CV:see HPI PUL:see HPI GI:no diarrhea constipation or melena, no indigestion GU:no hematuria, no dysuria MS:no joint pain, + possible claudication, though could be neuroapthy Neuro:no syncope, no lightheadedness Endo:+diabetes, no thyroid disease  Wt Readings from Last 3 Encounters:  03/04/14 168 lb (76.204 kg)  11/28/13 156 lb (70.761 kg)  11/12/13 154 lb 1.9 oz (69.908 kg)   Total chol 120, TG 244, HDL 29, LDL 42  02/25/14  hgb A1C 7.2  PHYSICAL EXAM BP 138/52  Pulse 62  Ht  (1.6 m)  Wt 168 lb (76.204 kg)  BMI 29.77 kg/m2 General:Pleasant affect, NAD, frustrated about her legs and then her diuretic with frequent voiding Skin:Warm and dry, brisk capillary refill HEENT:normocephalic, sclera clear, mucus membranes moist Neck:supple, no JVD, no bruits  Heart:S1S2 RRR without murmur, gallup, rub or click Lungs:clear without rales, rhonchi, or wheezes WJX:BJYN, non tender, + BS, do not palpate liver spleen or masses Ext:1-2+ lower ext edema, 2+ pedal pulses, 2+ radial  pulses Neuro:alert and oriented, MAE, follows commands, + facial symmetry   ASSESSMENT AND PLAN CORONARY ARTERY DISEASE She has not had her Lexiscan Myoview ordered in May. With her reduced ejection fraction and mild troponin elevation we will reschedule -she cannot walk on a treadmill therefore elected to scan will be done.  Edema extremities bil lower ext.  Will increase lasix to 60 mg for 2 days then back to 40  mg daily.    Hypertension controlled  Leg pain, bilateral May be due to edema, may be neuropathy, will increase lasix for 2 days.  She has pedal pulses, though if pain continues we could get lower ext arterial dopplers.   HYPERLIPIDEMIA Stable on statin, LDL is 42, TG elevated at 956 per PCP.  Atrial fibrillation No symptoms of atrial fib   She will follow up with Dr. Daiva Nakayama in 6 months unless stress test is positive.

## 2014-03-04 NOTE — Assessment & Plan Note (Signed)
Stable on statin, LDL is 42, TG elevated at 161 per PCP.

## 2014-03-04 NOTE — Assessment & Plan Note (Signed)
She has not had her Lexiscan Myoview ordered in May. With her reduced ejection fraction and mild troponin elevation we will reschedule -she cannot walk on a treadmill therefore elected to scan will be done.

## 2014-03-04 NOTE — Assessment & Plan Note (Signed)
No symptoms of atrial fib

## 2014-03-04 NOTE — Assessment & Plan Note (Signed)
controlled 

## 2014-03-04 NOTE — Patient Instructions (Addendum)
Your physician has requested that you have a lexiscan myoview. For further information please visit [email or web address] Please follow instruction sheet, as given.  Your physician has recommended you make the following change in your medication:  1. Increase Lasix 60 mg (instructions take 1 and 1/2 tab ) for only 2 days then resume current dose   Your physician wants you to follow-up in: 6 months with DR. Hocherin You will receive a reminder letter in the mail two months in advance. If you don't receive a letter, please call our office to schedule the follow-up appointment.

## 2014-03-21 ENCOUNTER — Encounter (HOSPITAL_COMMUNITY): Payer: Medicare Other

## 2014-07-05 DIAGNOSIS — F419 Anxiety disorder, unspecified: Secondary | ICD-10-CM | POA: Diagnosis not present

## 2014-07-05 DIAGNOSIS — F329 Major depressive disorder, single episode, unspecified: Secondary | ICD-10-CM | POA: Diagnosis not present

## 2014-07-07 DIAGNOSIS — F0390 Unspecified dementia without behavioral disturbance: Secondary | ICD-10-CM | POA: Diagnosis not present

## 2014-07-07 DIAGNOSIS — Z0000 Encounter for general adult medical examination without abnormal findings: Secondary | ICD-10-CM | POA: Diagnosis not present

## 2014-07-07 DIAGNOSIS — F322 Major depressive disorder, single episode, severe without psychotic features: Secondary | ICD-10-CM | POA: Diagnosis not present

## 2014-07-08 DIAGNOSIS — D519 Vitamin B12 deficiency anemia, unspecified: Secondary | ICD-10-CM | POA: Diagnosis not present

## 2014-07-08 DIAGNOSIS — R05 Cough: Secondary | ICD-10-CM | POA: Diagnosis not present

## 2014-07-08 DIAGNOSIS — E559 Vitamin D deficiency, unspecified: Secondary | ICD-10-CM | POA: Diagnosis not present

## 2014-07-10 DIAGNOSIS — F419 Anxiety disorder, unspecified: Secondary | ICD-10-CM | POA: Diagnosis not present

## 2014-07-10 DIAGNOSIS — F329 Major depressive disorder, single episode, unspecified: Secondary | ICD-10-CM | POA: Diagnosis not present

## 2014-07-15 DIAGNOSIS — G4700 Insomnia, unspecified: Secondary | ICD-10-CM | POA: Diagnosis not present

## 2014-07-15 DIAGNOSIS — F329 Major depressive disorder, single episode, unspecified: Secondary | ICD-10-CM | POA: Diagnosis not present

## 2014-07-15 DIAGNOSIS — F411 Generalized anxiety disorder: Secondary | ICD-10-CM | POA: Diagnosis not present

## 2014-07-21 DIAGNOSIS — F419 Anxiety disorder, unspecified: Secondary | ICD-10-CM | POA: Diagnosis not present

## 2014-07-21 DIAGNOSIS — F329 Major depressive disorder, single episode, unspecified: Secondary | ICD-10-CM | POA: Diagnosis not present

## 2014-07-28 DIAGNOSIS — F419 Anxiety disorder, unspecified: Secondary | ICD-10-CM | POA: Diagnosis not present

## 2014-07-28 DIAGNOSIS — F329 Major depressive disorder, single episode, unspecified: Secondary | ICD-10-CM | POA: Diagnosis not present

## 2014-08-06 DIAGNOSIS — M79673 Pain in unspecified foot: Secondary | ICD-10-CM | POA: Diagnosis not present

## 2014-08-06 DIAGNOSIS — B351 Tinea unguium: Secondary | ICD-10-CM | POA: Diagnosis not present

## 2014-08-06 DIAGNOSIS — L600 Ingrowing nail: Secondary | ICD-10-CM | POA: Diagnosis not present

## 2014-08-06 DIAGNOSIS — M2010 Hallux valgus (acquired), unspecified foot: Secondary | ICD-10-CM | POA: Diagnosis not present

## 2014-08-11 DIAGNOSIS — I739 Peripheral vascular disease, unspecified: Secondary | ICD-10-CM | POA: Diagnosis not present

## 2014-08-11 DIAGNOSIS — I2510 Atherosclerotic heart disease of native coronary artery without angina pectoris: Secondary | ICD-10-CM | POA: Diagnosis not present

## 2014-08-11 DIAGNOSIS — I130 Hypertensive heart and chronic kidney disease with heart failure and stage 1 through stage 4 chronic kidney disease, or unspecified chronic kidney disease: Secondary | ICD-10-CM | POA: Diagnosis not present

## 2014-08-11 DIAGNOSIS — N183 Chronic kidney disease, stage 3 (moderate): Secondary | ICD-10-CM | POA: Diagnosis not present

## 2014-08-11 DIAGNOSIS — E559 Vitamin D deficiency, unspecified: Secondary | ICD-10-CM | POA: Diagnosis not present

## 2014-08-11 DIAGNOSIS — E785 Hyperlipidemia, unspecified: Secondary | ICD-10-CM | POA: Diagnosis not present

## 2014-08-11 DIAGNOSIS — E1159 Type 2 diabetes mellitus with other circulatory complications: Secondary | ICD-10-CM | POA: Diagnosis not present

## 2014-08-13 DIAGNOSIS — M6281 Muscle weakness (generalized): Secondary | ICD-10-CM | POA: Diagnosis not present

## 2014-08-13 DIAGNOSIS — F329 Major depressive disorder, single episode, unspecified: Secondary | ICD-10-CM | POA: Diagnosis not present

## 2014-08-13 DIAGNOSIS — Z720 Tobacco use: Secondary | ICD-10-CM | POA: Diagnosis not present

## 2014-08-13 DIAGNOSIS — E669 Obesity, unspecified: Secondary | ICD-10-CM | POA: Diagnosis not present

## 2014-08-13 DIAGNOSIS — M170 Bilateral primary osteoarthritis of knee: Secondary | ICD-10-CM | POA: Diagnosis not present

## 2014-08-13 DIAGNOSIS — Z9181 History of falling: Secondary | ICD-10-CM | POA: Diagnosis not present

## 2014-08-13 DIAGNOSIS — I10 Essential (primary) hypertension: Secondary | ICD-10-CM | POA: Diagnosis not present

## 2014-08-13 DIAGNOSIS — I2510 Atherosclerotic heart disease of native coronary artery without angina pectoris: Secondary | ICD-10-CM | POA: Diagnosis not present

## 2014-08-13 DIAGNOSIS — E119 Type 2 diabetes mellitus without complications: Secondary | ICD-10-CM | POA: Diagnosis not present

## 2014-08-14 DIAGNOSIS — E119 Type 2 diabetes mellitus without complications: Secondary | ICD-10-CM | POA: Diagnosis not present

## 2014-08-14 DIAGNOSIS — Z961 Presence of intraocular lens: Secondary | ICD-10-CM | POA: Diagnosis not present

## 2014-08-14 DIAGNOSIS — H33052 Total retinal detachment, left eye: Secondary | ICD-10-CM | POA: Diagnosis not present

## 2014-08-15 DIAGNOSIS — M6281 Muscle weakness (generalized): Secondary | ICD-10-CM | POA: Diagnosis not present

## 2014-08-15 DIAGNOSIS — M170 Bilateral primary osteoarthritis of knee: Secondary | ICD-10-CM | POA: Diagnosis not present

## 2014-08-15 DIAGNOSIS — F329 Major depressive disorder, single episode, unspecified: Secondary | ICD-10-CM | POA: Diagnosis not present

## 2014-08-15 DIAGNOSIS — E119 Type 2 diabetes mellitus without complications: Secondary | ICD-10-CM | POA: Diagnosis not present

## 2014-08-15 DIAGNOSIS — I2510 Atherosclerotic heart disease of native coronary artery without angina pectoris: Secondary | ICD-10-CM | POA: Diagnosis not present

## 2014-08-15 DIAGNOSIS — I10 Essential (primary) hypertension: Secondary | ICD-10-CM | POA: Diagnosis not present

## 2014-08-16 DIAGNOSIS — F419 Anxiety disorder, unspecified: Secondary | ICD-10-CM | POA: Diagnosis not present

## 2014-08-16 DIAGNOSIS — M170 Bilateral primary osteoarthritis of knee: Secondary | ICD-10-CM | POA: Diagnosis not present

## 2014-08-16 DIAGNOSIS — E119 Type 2 diabetes mellitus without complications: Secondary | ICD-10-CM | POA: Diagnosis not present

## 2014-08-16 DIAGNOSIS — I2510 Atherosclerotic heart disease of native coronary artery without angina pectoris: Secondary | ICD-10-CM | POA: Diagnosis not present

## 2014-08-16 DIAGNOSIS — I10 Essential (primary) hypertension: Secondary | ICD-10-CM | POA: Diagnosis not present

## 2014-08-16 DIAGNOSIS — F329 Major depressive disorder, single episode, unspecified: Secondary | ICD-10-CM | POA: Diagnosis not present

## 2014-08-16 DIAGNOSIS — M6281 Muscle weakness (generalized): Secondary | ICD-10-CM | POA: Diagnosis not present

## 2014-08-18 DIAGNOSIS — F329 Major depressive disorder, single episode, unspecified: Secondary | ICD-10-CM | POA: Diagnosis not present

## 2014-08-18 DIAGNOSIS — F419 Anxiety disorder, unspecified: Secondary | ICD-10-CM | POA: Diagnosis not present

## 2014-08-19 DIAGNOSIS — E1159 Type 2 diabetes mellitus with other circulatory complications: Secondary | ICD-10-CM | POA: Diagnosis not present

## 2014-08-19 DIAGNOSIS — I10 Essential (primary) hypertension: Secondary | ICD-10-CM | POA: Diagnosis not present

## 2014-08-19 DIAGNOSIS — E785 Hyperlipidemia, unspecified: Secondary | ICD-10-CM | POA: Diagnosis not present

## 2014-08-20 DIAGNOSIS — E119 Type 2 diabetes mellitus without complications: Secondary | ICD-10-CM | POA: Diagnosis not present

## 2014-08-20 DIAGNOSIS — I10 Essential (primary) hypertension: Secondary | ICD-10-CM | POA: Diagnosis not present

## 2014-08-20 DIAGNOSIS — F329 Major depressive disorder, single episode, unspecified: Secondary | ICD-10-CM | POA: Diagnosis not present

## 2014-08-20 DIAGNOSIS — M6281 Muscle weakness (generalized): Secondary | ICD-10-CM | POA: Diagnosis not present

## 2014-08-20 DIAGNOSIS — I2510 Atherosclerotic heart disease of native coronary artery without angina pectoris: Secondary | ICD-10-CM | POA: Diagnosis not present

## 2014-08-20 DIAGNOSIS — M170 Bilateral primary osteoarthritis of knee: Secondary | ICD-10-CM | POA: Diagnosis not present

## 2014-08-21 DIAGNOSIS — F329 Major depressive disorder, single episode, unspecified: Secondary | ICD-10-CM | POA: Diagnosis not present

## 2014-08-21 DIAGNOSIS — I10 Essential (primary) hypertension: Secondary | ICD-10-CM | POA: Diagnosis not present

## 2014-08-21 DIAGNOSIS — M170 Bilateral primary osteoarthritis of knee: Secondary | ICD-10-CM | POA: Diagnosis not present

## 2014-08-21 DIAGNOSIS — G4700 Insomnia, unspecified: Secondary | ICD-10-CM | POA: Diagnosis not present

## 2014-08-21 DIAGNOSIS — I2510 Atherosclerotic heart disease of native coronary artery without angina pectoris: Secondary | ICD-10-CM | POA: Diagnosis not present

## 2014-08-21 DIAGNOSIS — E119 Type 2 diabetes mellitus without complications: Secondary | ICD-10-CM | POA: Diagnosis not present

## 2014-08-21 DIAGNOSIS — M6281 Muscle weakness (generalized): Secondary | ICD-10-CM | POA: Diagnosis not present

## 2014-08-21 DIAGNOSIS — F411 Generalized anxiety disorder: Secondary | ICD-10-CM | POA: Diagnosis not present

## 2014-08-22 DIAGNOSIS — M6281 Muscle weakness (generalized): Secondary | ICD-10-CM | POA: Diagnosis not present

## 2014-08-22 DIAGNOSIS — I2510 Atherosclerotic heart disease of native coronary artery without angina pectoris: Secondary | ICD-10-CM | POA: Diagnosis not present

## 2014-08-22 DIAGNOSIS — F329 Major depressive disorder, single episode, unspecified: Secondary | ICD-10-CM | POA: Diagnosis not present

## 2014-08-22 DIAGNOSIS — M170 Bilateral primary osteoarthritis of knee: Secondary | ICD-10-CM | POA: Diagnosis not present

## 2014-08-22 DIAGNOSIS — I10 Essential (primary) hypertension: Secondary | ICD-10-CM | POA: Diagnosis not present

## 2014-08-22 DIAGNOSIS — E119 Type 2 diabetes mellitus without complications: Secondary | ICD-10-CM | POA: Diagnosis not present

## 2014-08-25 DIAGNOSIS — M170 Bilateral primary osteoarthritis of knee: Secondary | ICD-10-CM | POA: Diagnosis not present

## 2014-08-25 DIAGNOSIS — F419 Anxiety disorder, unspecified: Secondary | ICD-10-CM | POA: Diagnosis not present

## 2014-08-25 DIAGNOSIS — M6281 Muscle weakness (generalized): Secondary | ICD-10-CM | POA: Diagnosis not present

## 2014-08-25 DIAGNOSIS — E119 Type 2 diabetes mellitus without complications: Secondary | ICD-10-CM | POA: Diagnosis not present

## 2014-08-25 DIAGNOSIS — I2510 Atherosclerotic heart disease of native coronary artery without angina pectoris: Secondary | ICD-10-CM | POA: Diagnosis not present

## 2014-08-25 DIAGNOSIS — F329 Major depressive disorder, single episode, unspecified: Secondary | ICD-10-CM | POA: Diagnosis not present

## 2014-08-25 DIAGNOSIS — I10 Essential (primary) hypertension: Secondary | ICD-10-CM | POA: Diagnosis not present

## 2014-08-26 DIAGNOSIS — I2510 Atherosclerotic heart disease of native coronary artery without angina pectoris: Secondary | ICD-10-CM | POA: Diagnosis not present

## 2014-08-26 DIAGNOSIS — I10 Essential (primary) hypertension: Secondary | ICD-10-CM | POA: Diagnosis not present

## 2014-08-26 DIAGNOSIS — M170 Bilateral primary osteoarthritis of knee: Secondary | ICD-10-CM | POA: Diagnosis not present

## 2014-08-26 DIAGNOSIS — E119 Type 2 diabetes mellitus without complications: Secondary | ICD-10-CM | POA: Diagnosis not present

## 2014-08-26 DIAGNOSIS — M6281 Muscle weakness (generalized): Secondary | ICD-10-CM | POA: Diagnosis not present

## 2014-08-26 DIAGNOSIS — F329 Major depressive disorder, single episode, unspecified: Secondary | ICD-10-CM | POA: Diagnosis not present

## 2014-09-01 DIAGNOSIS — E119 Type 2 diabetes mellitus without complications: Secondary | ICD-10-CM | POA: Diagnosis not present

## 2014-09-01 DIAGNOSIS — I2510 Atherosclerotic heart disease of native coronary artery without angina pectoris: Secondary | ICD-10-CM | POA: Diagnosis not present

## 2014-09-01 DIAGNOSIS — F329 Major depressive disorder, single episode, unspecified: Secondary | ICD-10-CM | POA: Diagnosis not present

## 2014-09-01 DIAGNOSIS — I10 Essential (primary) hypertension: Secondary | ICD-10-CM | POA: Diagnosis not present

## 2014-09-01 DIAGNOSIS — M170 Bilateral primary osteoarthritis of knee: Secondary | ICD-10-CM | POA: Diagnosis not present

## 2014-09-01 DIAGNOSIS — M6281 Muscle weakness (generalized): Secondary | ICD-10-CM | POA: Diagnosis not present

## 2014-09-02 ENCOUNTER — Ambulatory Visit: Payer: Medicare Other | Admitting: Cardiology

## 2014-09-03 DIAGNOSIS — R05 Cough: Secondary | ICD-10-CM | POA: Diagnosis not present

## 2014-09-05 DIAGNOSIS — M6281 Muscle weakness (generalized): Secondary | ICD-10-CM | POA: Diagnosis not present

## 2014-09-05 DIAGNOSIS — I10 Essential (primary) hypertension: Secondary | ICD-10-CM | POA: Diagnosis not present

## 2014-09-05 DIAGNOSIS — I2510 Atherosclerotic heart disease of native coronary artery without angina pectoris: Secondary | ICD-10-CM | POA: Diagnosis not present

## 2014-09-05 DIAGNOSIS — F329 Major depressive disorder, single episode, unspecified: Secondary | ICD-10-CM | POA: Diagnosis not present

## 2014-09-05 DIAGNOSIS — E119 Type 2 diabetes mellitus without complications: Secondary | ICD-10-CM | POA: Diagnosis not present

## 2014-09-05 DIAGNOSIS — M170 Bilateral primary osteoarthritis of knee: Secondary | ICD-10-CM | POA: Diagnosis not present

## 2014-09-08 DIAGNOSIS — I739 Peripheral vascular disease, unspecified: Secondary | ICD-10-CM | POA: Diagnosis not present

## 2014-09-08 DIAGNOSIS — E1159 Type 2 diabetes mellitus with other circulatory complications: Secondary | ICD-10-CM | POA: Diagnosis not present

## 2014-09-08 DIAGNOSIS — I2510 Atherosclerotic heart disease of native coronary artery without angina pectoris: Secondary | ICD-10-CM | POA: Diagnosis not present

## 2014-09-08 DIAGNOSIS — E785 Hyperlipidemia, unspecified: Secondary | ICD-10-CM | POA: Diagnosis not present

## 2014-09-08 DIAGNOSIS — N183 Chronic kidney disease, stage 3 (moderate): Secondary | ICD-10-CM | POA: Diagnosis not present

## 2014-09-08 DIAGNOSIS — E559 Vitamin D deficiency, unspecified: Secondary | ICD-10-CM | POA: Diagnosis not present

## 2014-09-08 DIAGNOSIS — I130 Hypertensive heart and chronic kidney disease with heart failure and stage 1 through stage 4 chronic kidney disease, or unspecified chronic kidney disease: Secondary | ICD-10-CM | POA: Diagnosis not present

## 2014-09-15 DIAGNOSIS — F419 Anxiety disorder, unspecified: Secondary | ICD-10-CM | POA: Diagnosis not present

## 2014-09-15 DIAGNOSIS — F329 Major depressive disorder, single episode, unspecified: Secondary | ICD-10-CM | POA: Diagnosis not present

## 2014-09-16 DIAGNOSIS — E785 Hyperlipidemia, unspecified: Secondary | ICD-10-CM | POA: Diagnosis not present

## 2014-09-29 DIAGNOSIS — I130 Hypertensive heart and chronic kidney disease with heart failure and stage 1 through stage 4 chronic kidney disease, or unspecified chronic kidney disease: Secondary | ICD-10-CM | POA: Diagnosis not present

## 2014-09-29 DIAGNOSIS — E785 Hyperlipidemia, unspecified: Secondary | ICD-10-CM | POA: Diagnosis not present

## 2014-09-29 DIAGNOSIS — I2510 Atherosclerotic heart disease of native coronary artery without angina pectoris: Secondary | ICD-10-CM | POA: Diagnosis not present

## 2014-09-29 DIAGNOSIS — E1159 Type 2 diabetes mellitus with other circulatory complications: Secondary | ICD-10-CM | POA: Diagnosis not present

## 2014-09-29 DIAGNOSIS — E559 Vitamin D deficiency, unspecified: Secondary | ICD-10-CM | POA: Diagnosis not present

## 2014-09-29 DIAGNOSIS — J069 Acute upper respiratory infection, unspecified: Secondary | ICD-10-CM | POA: Diagnosis not present

## 2014-09-29 DIAGNOSIS — I739 Peripheral vascular disease, unspecified: Secondary | ICD-10-CM | POA: Diagnosis not present

## 2014-10-06 ENCOUNTER — Ambulatory Visit: Payer: Medicare Other | Admitting: Cardiology

## 2014-10-07 DIAGNOSIS — E785 Hyperlipidemia, unspecified: Secondary | ICD-10-CM | POA: Diagnosis not present

## 2014-10-13 DIAGNOSIS — E785 Hyperlipidemia, unspecified: Secondary | ICD-10-CM | POA: Diagnosis not present

## 2014-10-13 DIAGNOSIS — E1159 Type 2 diabetes mellitus with other circulatory complications: Secondary | ICD-10-CM | POA: Diagnosis not present

## 2014-10-13 DIAGNOSIS — F419 Anxiety disorder, unspecified: Secondary | ICD-10-CM | POA: Diagnosis not present

## 2014-10-13 DIAGNOSIS — I130 Hypertensive heart and chronic kidney disease with heart failure and stage 1 through stage 4 chronic kidney disease, or unspecified chronic kidney disease: Secondary | ICD-10-CM | POA: Diagnosis not present

## 2014-10-13 DIAGNOSIS — I739 Peripheral vascular disease, unspecified: Secondary | ICD-10-CM | POA: Diagnosis not present

## 2014-10-13 DIAGNOSIS — I2510 Atherosclerotic heart disease of native coronary artery without angina pectoris: Secondary | ICD-10-CM | POA: Diagnosis not present

## 2014-10-13 DIAGNOSIS — N183 Chronic kidney disease, stage 3 (moderate): Secondary | ICD-10-CM | POA: Diagnosis not present

## 2014-10-13 DIAGNOSIS — F329 Major depressive disorder, single episode, unspecified: Secondary | ICD-10-CM | POA: Diagnosis not present

## 2014-10-13 DIAGNOSIS — E559 Vitamin D deficiency, unspecified: Secondary | ICD-10-CM | POA: Diagnosis not present

## 2014-10-20 DIAGNOSIS — F419 Anxiety disorder, unspecified: Secondary | ICD-10-CM | POA: Diagnosis not present

## 2014-10-20 DIAGNOSIS — F329 Major depressive disorder, single episode, unspecified: Secondary | ICD-10-CM | POA: Diagnosis not present

## 2014-10-21 DIAGNOSIS — E559 Vitamin D deficiency, unspecified: Secondary | ICD-10-CM | POA: Diagnosis not present

## 2014-10-21 DIAGNOSIS — D519 Vitamin B12 deficiency anemia, unspecified: Secondary | ICD-10-CM | POA: Diagnosis not present

## 2014-10-23 DIAGNOSIS — F329 Major depressive disorder, single episode, unspecified: Secondary | ICD-10-CM | POA: Diagnosis not present

## 2014-10-23 DIAGNOSIS — F411 Generalized anxiety disorder: Secondary | ICD-10-CM | POA: Diagnosis not present

## 2014-10-23 DIAGNOSIS — G4700 Insomnia, unspecified: Secondary | ICD-10-CM | POA: Diagnosis not present

## 2014-10-27 DIAGNOSIS — F329 Major depressive disorder, single episode, unspecified: Secondary | ICD-10-CM | POA: Diagnosis not present

## 2014-10-27 DIAGNOSIS — F419 Anxiety disorder, unspecified: Secondary | ICD-10-CM | POA: Diagnosis not present

## 2014-11-08 DIAGNOSIS — F419 Anxiety disorder, unspecified: Secondary | ICD-10-CM | POA: Diagnosis not present

## 2014-11-08 DIAGNOSIS — F329 Major depressive disorder, single episode, unspecified: Secondary | ICD-10-CM | POA: Diagnosis not present

## 2014-11-09 ENCOUNTER — Emergency Department (HOSPITAL_COMMUNITY)
Admission: EM | Admit: 2014-11-09 | Discharge: 2014-11-09 | Disposition: A | Discharge status: Home / Self Care | Payer: Medicare Other | Attending: Emergency Medicine | Admitting: Emergency Medicine

## 2014-11-09 ENCOUNTER — Encounter (HOSPITAL_COMMUNITY): Payer: Self-pay | Admitting: *Deleted

## 2014-11-09 ENCOUNTER — Emergency Department (HOSPITAL_COMMUNITY): Payer: Medicare Other

## 2014-11-09 DIAGNOSIS — M1990 Unspecified osteoarthritis, unspecified site: Secondary | ICD-10-CM | POA: Diagnosis not present

## 2014-11-09 DIAGNOSIS — E119 Type 2 diabetes mellitus without complications: Secondary | ICD-10-CM | POA: Diagnosis not present

## 2014-11-09 DIAGNOSIS — Z9861 Coronary angioplasty status: Secondary | ICD-10-CM | POA: Insufficient documentation

## 2014-11-09 DIAGNOSIS — Z79899 Other long term (current) drug therapy: Secondary | ICD-10-CM | POA: Insufficient documentation

## 2014-11-09 DIAGNOSIS — S59901A Unspecified injury of right elbow, initial encounter: Secondary | ICD-10-CM | POA: Diagnosis not present

## 2014-11-09 DIAGNOSIS — Z7982 Long term (current) use of aspirin: Secondary | ICD-10-CM | POA: Insufficient documentation

## 2014-11-09 DIAGNOSIS — Z794 Long term (current) use of insulin: Secondary | ICD-10-CM | POA: Diagnosis not present

## 2014-11-09 DIAGNOSIS — Z720 Tobacco use: Secondary | ICD-10-CM | POA: Insufficient documentation

## 2014-11-09 DIAGNOSIS — F0390 Unspecified dementia without behavioral disturbance: Secondary | ICD-10-CM | POA: Insufficient documentation

## 2014-11-09 DIAGNOSIS — E785 Hyperlipidemia, unspecified: Secondary | ICD-10-CM | POA: Insufficient documentation

## 2014-11-09 DIAGNOSIS — Y92128 Other place in nursing home as the place of occurrence of the external cause: Secondary | ICD-10-CM | POA: Insufficient documentation

## 2014-11-09 DIAGNOSIS — R6889 Other general symptoms and signs: Secondary | ICD-10-CM | POA: Diagnosis not present

## 2014-11-09 DIAGNOSIS — Z8673 Personal history of transient ischemic attack (TIA), and cerebral infarction without residual deficits: Secondary | ICD-10-CM | POA: Diagnosis not present

## 2014-11-09 DIAGNOSIS — E876 Hypokalemia: Secondary | ICD-10-CM | POA: Insufficient documentation

## 2014-11-09 DIAGNOSIS — W07XXXA Fall from chair, initial encounter: Secondary | ICD-10-CM | POA: Diagnosis not present

## 2014-11-09 DIAGNOSIS — Y9389 Activity, other specified: Secondary | ICD-10-CM | POA: Diagnosis not present

## 2014-11-09 DIAGNOSIS — K219 Gastro-esophageal reflux disease without esophagitis: Secondary | ICD-10-CM | POA: Insufficient documentation

## 2014-11-09 DIAGNOSIS — Y998 Other external cause status: Secondary | ICD-10-CM | POA: Diagnosis not present

## 2014-11-09 DIAGNOSIS — Z880 Allergy status to penicillin: Secondary | ICD-10-CM | POA: Insufficient documentation

## 2014-11-09 DIAGNOSIS — S5001XA Contusion of right elbow, initial encounter: Secondary | ICD-10-CM | POA: Diagnosis not present

## 2014-11-09 DIAGNOSIS — F329 Major depressive disorder, single episode, unspecified: Secondary | ICD-10-CM | POA: Insufficient documentation

## 2014-11-09 DIAGNOSIS — W19XXXA Unspecified fall, initial encounter: Secondary | ICD-10-CM

## 2014-11-09 DIAGNOSIS — I2510 Atherosclerotic heart disease of native coronary artery without angina pectoris: Secondary | ICD-10-CM | POA: Insufficient documentation

## 2014-11-09 DIAGNOSIS — M79643 Pain in unspecified hand: Secondary | ICD-10-CM | POA: Diagnosis not present

## 2014-11-09 DIAGNOSIS — M25521 Pain in right elbow: Secondary | ICD-10-CM | POA: Diagnosis not present

## 2014-11-09 LAB — CBG MONITORING, ED: Glucose-Capillary: 102 mg/dL — ABNORMAL HIGH (ref 70–99)

## 2014-11-09 NOTE — ED Notes (Signed)
Pt from [facility]. Per EMS patient reports falling. She slid down out of the chair she was sitting in and struck her head. Currently she c/o pain in her right elbow with movement. No deformity and or swelling to site.Pt has active ROM. Hx DM. Allergy penicillin

## 2014-11-09 NOTE — ED Notes (Signed)
Bed: [id number] Expected date: 11/09/14 Expected time: 6:33 PM Means of arrival: Ambulance Comments: fall

## 2014-11-09 NOTE — ED Provider Notes (Signed)
CSN: [id number]     Arrival date & time 11/09/14  1839 History   First MD Initiated Contact with Patient 11/09/14 1846     Chief Complaint  Patient presents with  . 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 He can fell at 6 PM today. Follows unwitnessed however another resident at the assisted-living reported that the patient stood up from a chair and tried to sit down again however missed the chair when she tried to sit down. Patient complains of right elbow pain. Which is mild. No other complaint. Pain is worse with movement. It is uncertain if she struck her head. Patient denies headache or neck pain or pain elsewhere.   (Consider location/radiation/quality/duration/timing/severity/associated sxs/prior Treatment) HPI  Past Medical History  Diagnosis Date  . CORONARY ARTERY DISEASE 07/20/2010    with stent  . DEPRESSION 07/20/2010  . DM (diabetes mellitus) type II controlled, neurological manifestation 07/20/2010  . GERD 07/20/2010  . HYPERLIPIDEMIA 07/20/2010  . HYPERTENSION 07/20/2010  . LOW BACK PAIN 07/20/2010  . OSTEOARTHRITIS 07/20/2010  . URINARY INCONTINENCE 07/20/2010  . TIA (transient ischemic attack)   . CAD (coronary artery disease)     Stenting 2008 (no details)  . Dementia   . Hypokalemia   . Spinal stenosis   . Tobacco abuse   . Varicose vein of leg    Past Surgical History  Procedure Laterality Date  . Abdominal hysterectomy    . Vitrectomy Left   . Total knee arthroplasty    . Coronary stent placement    . Varicose vein surgery     Family History  Problem Relation Age of Onset  . Hypertension Mother   . Diabetes Mother   . Breast cancer Sister    History  Substance Use Topics  . Smoking status: Current Every Day Smoker -- 0.50 packs/day    Types: Cigarettes  . Smokeless tobacco: Never Used     Comment: smokes 2-3 per day  . Alcohol Use: No   OB  History    No data available     Review of Systems  Unable to perform ROS Musculoskeletal: Positive for arthralgias.       Right elbow pain   unable to perform complete review of systems. Dementia    Allergies  Penicillins  Home Medications   Prior to Admission medications   Medication Sig Start Date End Date Taking? Authorizing Provider  acetaminophen (TYLENOL) 325 MG tablet Take 650 mg by mouth every 4 (four) hours as needed for pain.    Historical Provider, MD  albuterol (PROVENTIL) (2.5 MG/3ML) 0.083% nebulizer solution Take 3 mLs (2.5 mg total) by nebulization every 4 (four) hours as needed for wheezing or shortness of breath. 08/18/13   Renae Fickle, MD  aspirin 81 MG chewable tablet Chew 81 mg by mouth daily.    Historical Provider, MD  busPIRone (BUSPAR) 10 MG tablet Take 10 mg by mouth 2 (two) times daily.    Historical Provider, MD  cholecalciferol (VITAMIN D) 1000 UNITS tablet Take 1,000 Units by mouth daily.    Historical Provider, MD  cloNIDine (CATAPRES) 0.2 MG tablet TAKE 1 TABLET BY MOUTH TWICE A DAY.    Rollene Rotunda, MD  diltiazem (CARDIZEM CD) 120 MG 24 hr capsule Take 1 capsule (120 mg total) by mouth daily. 10/01/13   Leroy Sea, MD  DULoxetine (CYMBALTA) 60 MG capsule Take 1 capsule (60 mg  total) by mouth daily. 09/04/12   Rollene RotundaJames Hochrein, MD  felodipine (PLENDIL) 10 MG 24 hr tablet Take 1 tablet (10 mg total) by mouth daily. 09/04/12   Rollene RotundaJames Hochrein, MD  furosemide (LASIX) 40 MG tablet Take 1 tablet (40 mg total) by mouth daily. 10/01/13   Leroy SeaPrashant K Singh, MD  insulin aspart (NOVOLOG) 100 UNIT/ML injection Inject 4 Units into the skin 3 (three) times daily before meals. Hold if BS is less than 125    Historical Provider, MD  LORazepam (ATIVAN) 0.5 MG tablet Take 0.5 mg by mouth 2 (two) times daily.  09/04/12   Rollene RotundaJames Hochrein, MD  Memantine HCl ER (NAMENDA XR) 28 MG CP24 Take 28 mg by mouth daily.    Historical Provider, MD  metFORMIN (GLUCOPHAGE) 1000 MG  tablet Take 1,000 mg by mouth 2 (two) times daily with a meal.    Historical Provider, MD  ondansetron (ZOFRAN) 4 MG tablet Take 4 mg by mouth every 6 (six) hours as needed for nausea or vomiting.    Historical Provider, MD  pantoprazole (PROTONIX) 40 MG tablet Take 1 tablet (40 mg total) by mouth daily. 10/01/13   Leroy SeaPrashant K Singh, MD  potassium chloride SA (K-DUR,KLOR-CON) 10 MEQ tablet Take 1 tablet (10 mEq total) by mouth daily. 10/01/13   Leroy SeaPrashant K Singh, MD  simvastatin (ZOCOR) 20 MG tablet Take 1 tablet (20 mg total) by mouth daily. 09/04/12   Rollene RotundaJames Hochrein, MD  traMADol (ULTRAM) 50 MG tablet Take 50 mg by mouth every 6 (six) hours as needed (pain).    Historical Provider, MD  valsartan (DIOVAN) 80 MG tablet Take 1 tablet (80 mg total) by mouth daily. 10/17/13   Nyoka CowdenMichael B Wert, MD   BP 163/54 mmHg  Pulse 62  Temp(Src) 98.7 F (37.1 C) (Oral)  Resp 18  SpO2 98% Physical Exam  Constitutional: She appears well-developed and well-nourished.  HENT:  Head: Normocephalic and atraumatic.  Eyes: Conjunctivae are normal. Pupils are equal, round, and reactive to light.  Neck: Neck supple. No tracheal deviation present. No thyromegaly present.  Cardiovascular: Normal rate and regular rhythm.   No murmur heard. Pulmonary/Chest: Effort normal and breath sounds normal.  Abdominal: Soft. Bowel sounds are normal. She exhibits no distension. There is no tenderness.  Musculoskeletal: Normal range of motion. She exhibits no edema or tenderness.  Entire spine nontender. Pelvis stable nontender. Right upper extremity, skin intact. Tender over olecranon. No deformity. Radial pulse 2+. All other extremities or contusion abrasion or tenderness neurovascularly intact.  Neurological: She is alert. No cranial nerve deficit. Coordination normal.  Skin: Skin is warm and dry. No rash noted.  Psychiatric: She has a normal mood and affect.  Nursing note and vitals reviewed.   ED Course  Procedures (including  critical care time) Labs Review Labs Reviewed - No data to display  Imaging Review No results found.   EKG Interpretation None     X-ray viewed by me  9:15 PM patient resting comfortably. No distress. Spoke with patient's daughter Wilmon PaliJackie Hildrethis aware of pts being here Results for orders placed or performed during the hospital encounter of 11/09/14  POC CBG, ED  Result Value Ref Range   Glucose-Capillary 102 (H) 70 - 99 mg/dL   Dg Elbow Complete Right  11/09/2014   CLINICAL DATA:  Right elbow pain following fall, initial encounter  EXAM: RIGHT ELBOW - COMPLETE 3+ VIEW  COMPARISON:  None.  FINDINGS: No acute fracture or dislocation is noted. No gross soft  tissue abnormality is noted. Some well corticated bony densities are noted which may represent loose bodies within the joint.  IMPRESSION: No acute abnormality noted.   Electronically Signed   By: Alcide CleverMark  Lukens M.D.   On: 11/09/2014 20:50    MDM  Dx #1 fall #2 contusion to right elbow Final diagnoses:  None        Doug SouSam Kayn Haymore, MD 11/09/14 2126

## 2014-11-09 NOTE — Discharge Instructions (Signed)
X-ray of right elbow was obtained showing no fracture. No serious injury detected. Have [name] return or contact Dr.Tripp if concern for any reason

## 2014-11-12 DIAGNOSIS — I10 Essential (primary) hypertension: Secondary | ICD-10-CM | POA: Diagnosis not present

## 2014-11-12 DIAGNOSIS — E119 Type 2 diabetes mellitus without complications: Secondary | ICD-10-CM | POA: Diagnosis not present

## 2014-11-12 DIAGNOSIS — I2510 Atherosclerotic heart disease of native coronary artery without angina pectoris: Secondary | ICD-10-CM | POA: Diagnosis not present

## 2014-11-12 DIAGNOSIS — F0391 Unspecified dementia with behavioral disturbance: Secondary | ICD-10-CM | POA: Diagnosis not present

## 2014-11-12 DIAGNOSIS — Z87891 Personal history of nicotine dependence: Secondary | ICD-10-CM | POA: Diagnosis not present

## 2014-11-12 DIAGNOSIS — F329 Major depressive disorder, single episode, unspecified: Secondary | ICD-10-CM | POA: Diagnosis not present

## 2014-11-12 DIAGNOSIS — Z9181 History of falling: Secondary | ICD-10-CM | POA: Diagnosis not present

## 2014-11-12 DIAGNOSIS — Z7982 Long term (current) use of aspirin: Secondary | ICD-10-CM | POA: Diagnosis not present

## 2014-11-12 DIAGNOSIS — M1990 Unspecified osteoarthritis, unspecified site: Secondary | ICD-10-CM | POA: Diagnosis not present

## 2014-11-12 DIAGNOSIS — Z8673 Personal history of transient ischemic attack (TIA), and cerebral infarction without residual deficits: Secondary | ICD-10-CM | POA: Diagnosis not present

## 2014-11-12 DIAGNOSIS — Z794 Long term (current) use of insulin: Secondary | ICD-10-CM | POA: Diagnosis not present

## 2014-11-13 ENCOUNTER — Encounter (HOSPITAL_COMMUNITY): Payer: Self-pay | Admitting: Emergency Medicine

## 2014-11-13 ENCOUNTER — Emergency Department (HOSPITAL_COMMUNITY)
Admission: EM | Admit: 2014-11-13 | Discharge: 2014-11-13 | Disposition: A | Discharge status: Home / Self Care | Payer: Medicare Other | Attending: Emergency Medicine | Admitting: Emergency Medicine

## 2014-11-13 ENCOUNTER — Emergency Department (HOSPITAL_COMMUNITY): Payer: Medicare Other

## 2014-11-13 DIAGNOSIS — R05 Cough: Secondary | ICD-10-CM | POA: Diagnosis not present

## 2014-11-13 DIAGNOSIS — E1159 Type 2 diabetes mellitus with other circulatory complications: Secondary | ICD-10-CM | POA: Diagnosis not present

## 2014-11-13 DIAGNOSIS — Z9861 Coronary angioplasty status: Secondary | ICD-10-CM | POA: Diagnosis not present

## 2014-11-13 DIAGNOSIS — E559 Vitamin D deficiency, unspecified: Secondary | ICD-10-CM | POA: Diagnosis not present

## 2014-11-13 DIAGNOSIS — Z880 Allergy status to penicillin: Secondary | ICD-10-CM | POA: Insufficient documentation

## 2014-11-13 DIAGNOSIS — Z79899 Other long term (current) drug therapy: Secondary | ICD-10-CM | POA: Insufficient documentation

## 2014-11-13 DIAGNOSIS — E785 Hyperlipidemia, unspecified: Secondary | ICD-10-CM | POA: Diagnosis not present

## 2014-11-13 DIAGNOSIS — F0390 Unspecified dementia without behavioral disturbance: Secondary | ICD-10-CM | POA: Insufficient documentation

## 2014-11-13 DIAGNOSIS — R059 Cough, unspecified: Secondary | ICD-10-CM

## 2014-11-13 DIAGNOSIS — F329 Major depressive disorder, single episode, unspecified: Secondary | ICD-10-CM | POA: Diagnosis not present

## 2014-11-13 DIAGNOSIS — Z7982 Long term (current) use of aspirin: Secondary | ICD-10-CM | POA: Diagnosis not present

## 2014-11-13 DIAGNOSIS — E1149 Type 2 diabetes mellitus with other diabetic neurological complication: Secondary | ICD-10-CM | POA: Diagnosis not present

## 2014-11-13 DIAGNOSIS — I2510 Atherosclerotic heart disease of native coronary artery without angina pectoris: Secondary | ICD-10-CM | POA: Diagnosis not present

## 2014-11-13 DIAGNOSIS — R031 Nonspecific low blood-pressure reading: Secondary | ICD-10-CM | POA: Diagnosis not present

## 2014-11-13 DIAGNOSIS — M1990 Unspecified osteoarthritis, unspecified site: Secondary | ICD-10-CM | POA: Insufficient documentation

## 2014-11-13 DIAGNOSIS — Z720 Tobacco use: Secondary | ICD-10-CM | POA: Diagnosis not present

## 2014-11-13 DIAGNOSIS — I10 Essential (primary) hypertension: Secondary | ICD-10-CM | POA: Diagnosis not present

## 2014-11-13 DIAGNOSIS — Z8673 Personal history of transient ischemic attack (TIA), and cerebral infarction without residual deficits: Secondary | ICD-10-CM | POA: Insufficient documentation

## 2014-11-13 DIAGNOSIS — Z794 Long term (current) use of insulin: Secondary | ICD-10-CM | POA: Diagnosis not present

## 2014-11-13 DIAGNOSIS — I130 Hypertensive heart and chronic kidney disease with heart failure and stage 1 through stage 4 chronic kidney disease, or unspecified chronic kidney disease: Secondary | ICD-10-CM | POA: Diagnosis not present

## 2014-11-13 DIAGNOSIS — I739 Peripheral vascular disease, unspecified: Secondary | ICD-10-CM | POA: Diagnosis not present

## 2014-11-13 DIAGNOSIS — K219 Gastro-esophageal reflux disease without esophagitis: Secondary | ICD-10-CM | POA: Diagnosis not present

## 2014-11-13 DIAGNOSIS — N183 Chronic kidney disease, stage 3 (moderate): Secondary | ICD-10-CM | POA: Diagnosis not present

## 2014-11-13 MED ORDER — BENZONATATE 100 MG PO CAPS: 100.0000 mg | ORAL_CAPSULE | Freq: Three times a day (TID) | ORAL | Status: DC

## 2014-11-13 MED ORDER — SALINE SPRAY 0.65 % NA SOLN: 1.0000 | NASAL | Status: DC | PRN

## 2014-11-13 NOTE — ED Notes (Signed)
Bed: [id number] Expected date:  Expected time:  Means of arrival:  Comments: EMS- 82yo F, cough

## 2014-11-13 NOTE — ED Provider Notes (Signed)
CSN: [id number]     Arrival date & time 11/13/14  1139 History   First MD Initiated Contact with Patient 11/13/14 1148     Chief Complaint  Patient presents with  . Cough     (Consider location/radiation/quality/duration/timing/severity/associated sxs/prior Treatment) HPI Comments: Patient presents emergency department with chief complaint of cough 2 weeks. She states that she lives at [facility]. States that the staff there and finds that she come to the emergency department for evaluation of her cough. She denies any fevers, chills, chest pain, shortness of breath, or productive cough. Denies any other symptoms such as nausea, vomiting, diarrhea, or discharge.. She has not taken anything to alleviate the symptoms. She states that she does smoke occasionally, and thinks that this may be contributing to her cough. There are no other known aggravating or alleviating factors.  The history is provided by the patient. No language interpreter was used.    Past Medical History  Diagnosis Date  . CORONARY ARTERY DISEASE 07/20/2010    with stent  . DEPRESSION 07/20/2010  . DM (diabetes mellitus) type II controlled, neurological manifestation 07/20/2010  . GERD 07/20/2010  . HYPERLIPIDEMIA 07/20/2010  . HYPERTENSION 07/20/2010  . LOW BACK PAIN 07/20/2010  . OSTEOARTHRITIS 07/20/2010  . URINARY INCONTINENCE 07/20/2010  . TIA (transient ischemic attack)   . CAD (coronary artery disease)     Stenting 2008 (no details)  . Dementia   . Hypokalemia   . Spinal stenosis   . Tobacco abuse   . Varicose vein of leg    Past Surgical History  Procedure Laterality Date  . Abdominal hysterectomy    . Vitrectomy Left   . Total knee arthroplasty    . Coronary stent placement    . Varicose vein surgery     Family History  Problem Relation Age of Onset  . Hypertension Mother   . Diabetes Mother   . Breast cancer Sister    History  Substance Use Topics  . Smoking status:  Current Every Day Smoker -- 0.50 packs/day    Types: Cigarettes  . Smokeless tobacco: Never Used     Comment: smokes 2-3 per day  . Alcohol Use: No   OB History    No data available     Review of Systems  Constitutional: Negative for fever and chills.  Respiratory: Positive for cough. Negative for shortness of breath.   Cardiovascular: Negative for chest pain.  Gastrointestinal: Negative for nausea, vomiting, diarrhea and constipation.  Genitourinary: Negative for dysuria.      Allergies  Penicillins  Home Medications   Prior to Admission medications   Medication Sig Start Date End Date Taking? Authorizing Provider  acetaminophen (TYLENOL) 325 MG tablet Take 650 mg by mouth every 4 (four) hours as needed for pain.   Yes Historical Provider, MD  albuterol (PROVENTIL) (2.5 MG/3ML) 0.083% nebulizer solution Take 3 mLs (2.5 mg total) by nebulization every 4 (four) hours as needed for wheezing or shortness of breath. 08/18/13  Yes Renae Fickle, MD  aspirin 81 MG chewable tablet Chew 81 mg by mouth daily.   Yes Historical Provider, MD  cholecalciferol (VITAMIN D) 1000 UNITS tablet Take 1,000 Units by mouth daily.   Yes Historical Provider, MD  cloNIDine (CATAPRES) 0.2 MG tablet TAKE 1 TABLET BY MOUTH TWICE A DAY.   Yes Rollene Rotunda, MD  diltiazem (CARDIZEM CD) 120 MG 24 hr capsule Take 1 capsule (120 mg total) by mouth daily. 10/01/13  Yes Prashant  Curlene LabrumK Singh, MD  DULoxetine (CYMBALTA) 60 MG capsule Take 1 capsule (60 mg total) by mouth daily. 09/04/12  Yes Rollene RotundaJames Hochrein, MD  felodipine (PLENDIL) 10 MG 24 hr tablet Take 1 tablet (10 mg total) by mouth daily. 09/04/12  Yes Rollene RotundaJames Hochrein, MD  fenofibrate (TRICOR) 48 MG tablet Take 48 mg by mouth at bedtime.   Yes Historical Provider, MD  furosemide (LASIX) 20 MG tablet Take 20 mg by mouth daily at 12 noon.   Yes Historical Provider, MD  furosemide (LASIX) 20 MG tablet Take 10 mg by mouth daily as needed (for weight gain of 3 or more pounds  in one day in addition to regular dose).   Yes Historical Provider, MD  furosemide (LASIX) 40 MG tablet Take 1 tablet (40 mg total) by mouth daily. 10/01/13  Yes Leroy SeaPrashant K Singh, MD  gabapentin (NEURONTIN) 600 MG tablet Take 600 mg by mouth 4 (four) times daily.   Yes Historical Provider, MD  guaifenesin (ROBITUSSIN) 100 MG/5ML syrup Take 200 mg by mouth every 6 (six) hours as needed for cough.   Yes Historical Provider, MD  insulin aspart (NOVOLOG) 100 UNIT/ML injection Inject 10 Units into the skin 3 (three) times daily before meals. Hold if BS is less than 100   Yes Historical Provider, MD  LORazepam (ATIVAN) 0.5 MG tablet Take 0.5 mg by mouth 2 (two) times daily.  09/04/12  Yes Rollene RotundaJames Hochrein, MD  Memantine HCl ER (NAMENDA XR) 28 MG CP24 Take 28 mg by mouth daily.   Yes Historical Provider, MD  metFORMIN (GLUCOPHAGE) 1000 MG tablet Take 1,000 mg by mouth 2 (two) times daily with a meal.   Yes Historical Provider, MD  ondansetron (ZOFRAN) 4 MG tablet Take 4 mg by mouth every 6 (six) hours as needed for nausea or vomiting.   Yes Historical Provider, MD  pantoprazole (PROTONIX) 40 MG tablet Take 1 tablet (40 mg total) by mouth daily. 10/01/13  Yes Leroy SeaPrashant K Singh, MD  potassium chloride SA (K-DUR,KLOR-CON) 10 MEQ tablet Take 1 tablet (10 mEq total) by mouth daily. Patient taking differently: Take 20 mEq by mouth daily.  10/01/13  Yes Leroy SeaPrashant K Singh, MD  simvastatin (ZOCOR) 20 MG tablet Take 1 tablet (20 mg total) by mouth daily. Patient taking differently: Take 10 mg by mouth at bedtime.  09/04/12  Yes Rollene RotundaJames Hochrein, MD  traMADol (ULTRAM) 50 MG tablet Take 100 mg by mouth 4 (four) times daily.    Yes Historical Provider, MD  traZODone (DESYREL) 50 MG tablet Take 50 mg by mouth at bedtime.   Yes Historical Provider, MD  valsartan (DIOVAN) 80 MG tablet Take 1 tablet (80 mg total) by mouth daily. 10/17/13  Yes Nyoka CowdenMichael B Wert, MD   BP 130/41 mmHg  Pulse 63  Temp(Src) 98.1 F (36.7 C) (Oral)  Resp 16   SpO2 97% Physical Exam  Constitutional: She is oriented to person, place, and time. She appears well-developed and well-nourished.  HENT:  Head: Normocephalic and atraumatic.  Eyes: Conjunctivae and EOM are normal. Pupils are equal, round, and reactive to light.  Neck: Normal range of motion. Neck supple.  Cardiovascular: Normal rate and regular rhythm.  Exam reveals no gallop and no friction rub.   No murmur heard. Pulmonary/Chest: Effort normal and breath sounds normal. No respiratory distress. She has no wheezes. She has no rales. She exhibits no tenderness.  Clear to auscultation bilaterally  Abdominal: Soft. Bowel sounds are normal. She exhibits no distension and no mass.  There is no tenderness. There is no rebound and no guarding.  Musculoskeletal: Normal range of motion. She exhibits no edema or tenderness.  Neurological: She is alert and oriented to person, place, and time.  Skin: Skin is warm and dry.  Psychiatric: She has a normal mood and affect. Her behavior is normal. Judgment and thought content normal.  Nursing note and vitals reviewed.   ED Course  Procedures (including critical care time) Labs Review Labs Reviewed - No data to display  Imaging Review Dg Chest 2 View  11/13/2014   CLINICAL DATA:  Cough  EXAM: CHEST  2 VIEW  COMPARISON:  09/29/2013  FINDINGS: Cardiac enlargement without heart failure. Negative for infiltrate effusion or mass. Lungs are clear.  IMPRESSION: No active cardiopulmonary disease.   Electronically Signed   By: Marlan Palauharles  Clark M.D.   On: 11/13/2014 12:55     EKG Interpretation None      MDM   Final diagnoses:  Cough    Patient with cough 2 weeks. Sent to ED by assisted living center for chest x-ray. Will order chest x-ray, and reevaluate. Patient is well-appearing. Anticipate discharge to home.  Pt CXR negative for acute infiltrate. Patients symptoms are consistent with URI, likely viral etiology. Discussed that antibiotics are not  indicated for viral infections. Pt will be discharged with symptomatic treatment.  Verbalizes understanding and is agreeable with plan. Pt is hemodynamically stable & in NAD prior to dc.  Filed Vitals:   11/13/14 1141  BP: 130/41  Pulse: 63  Temp: 98.1 F (36.7 C)  Resp: [address]16       Arad Burston, PA-C 11/13/14 1333  Benjiman CoreNathan Pickering, MD 11/14/14 [phone]

## 2014-11-13 NOTE — Discharge Instructions (Signed)
Cough, Adult  A cough is a reflex that helps clear your throat and airways. It can help heal the body or may be a reaction to an irritated airway. A cough may only last 2 or 3 weeks (acute) or may last more than 8 weeks (chronic).  CAUSES Acute cough:  Viral or bacterial infections. Chronic cough:  Infections.  Allergies.  Asthma.  Post-nasal drip.  Smoking.  Heartburn or acid reflux.  Some medicines.  Chronic lung problems (COPD).  Cancer. SYMPTOMS   Cough.  Fever.  Chest pain.  Increased breathing rate.  High-pitched whistling sound when breathing (wheezing).  Colored mucus that you cough up (sputum). TREATMENT   A bacterial cough may be treated with antibiotic medicine.  A viral cough must run its course and will not respond to antibiotics.  Your caregiver may recommend other treatments if you have a chronic cough. HOME CARE INSTRUCTIONS   Only take over-the-counter or prescription medicines for pain, discomfort, or fever as directed by your caregiver. Use cough suppressants only as directed by your caregiver.  Use a cold steam vaporizer or humidifier in your bedroom or home to help loosen secretions.  Sleep in a semi-upright position if your cough is worse at night.  Rest as needed.  Stop smoking if you smoke. SEEK IMMEDIATE MEDICAL CARE IF:   You have pus in your sputum.  Your cough starts to worsen.  You cannot control your cough with suppressants and are losing sleep.  You begin coughing up blood.  You have difficulty breathing.  You develop pain which is getting worse or is uncontrolled with medicine.  You have a fever. MAKE SURE YOU:   Understand these instructions.  Will watch your condition.  Will get help right away if you are not doing well or get worse. Document Released: 12/17/2010 Document Revised: 09/12/2011 Document Reviewed: 12/17/2010 Exit[employer] Patient Information 2015 [employer]. This information is not intended  to replace advice given to you by your health care provider. Make sure you discuss any questions you have with your health care provider.  

## 2014-11-13 NOTE — ED Notes (Signed)
Pt with cough for two weeks, [facility] Pt reports "I feel fine." Has baseline dementia without any worsening signs/symptoms. No fever here or reported. No SOB. No CP.

## 2014-11-13 NOTE — ED Notes (Signed)
[facility] center unable to provide transportation for patient.

## 2014-11-16 DIAGNOSIS — N390 Urinary tract infection, site not specified: Secondary | ICD-10-CM | POA: Diagnosis not present

## 2014-11-17 DIAGNOSIS — E559 Vitamin D deficiency, unspecified: Secondary | ICD-10-CM | POA: Diagnosis not present

## 2014-11-17 DIAGNOSIS — N183 Chronic kidney disease, stage 3 (moderate): Secondary | ICD-10-CM | POA: Diagnosis not present

## 2014-11-17 DIAGNOSIS — E785 Hyperlipidemia, unspecified: Secondary | ICD-10-CM | POA: Diagnosis not present

## 2014-11-17 DIAGNOSIS — F329 Major depressive disorder, single episode, unspecified: Secondary | ICD-10-CM | POA: Diagnosis not present

## 2014-11-17 DIAGNOSIS — I130 Hypertensive heart and chronic kidney disease with heart failure and stage 1 through stage 4 chronic kidney disease, or unspecified chronic kidney disease: Secondary | ICD-10-CM | POA: Diagnosis not present

## 2014-11-17 DIAGNOSIS — E1159 Type 2 diabetes mellitus with other circulatory complications: Secondary | ICD-10-CM | POA: Diagnosis not present

## 2014-11-17 DIAGNOSIS — I2510 Atherosclerotic heart disease of native coronary artery without angina pectoris: Secondary | ICD-10-CM | POA: Diagnosis not present

## 2014-11-17 DIAGNOSIS — F419 Anxiety disorder, unspecified: Secondary | ICD-10-CM | POA: Diagnosis not present

## 2014-11-17 DIAGNOSIS — I739 Peripheral vascular disease, unspecified: Secondary | ICD-10-CM | POA: Diagnosis not present

## 2014-11-18 ENCOUNTER — Ambulatory Visit: Payer: Medicare Other | Admitting: Cardiology

## 2014-11-18 DIAGNOSIS — F0391 Unspecified dementia with behavioral disturbance: Secondary | ICD-10-CM | POA: Diagnosis not present

## 2014-11-18 DIAGNOSIS — F329 Major depressive disorder, single episode, unspecified: Secondary | ICD-10-CM | POA: Diagnosis not present

## 2014-11-18 DIAGNOSIS — E119 Type 2 diabetes mellitus without complications: Secondary | ICD-10-CM | POA: Diagnosis not present

## 2014-11-18 DIAGNOSIS — I2510 Atherosclerotic heart disease of native coronary artery without angina pectoris: Secondary | ICD-10-CM | POA: Diagnosis not present

## 2014-11-18 DIAGNOSIS — I10 Essential (primary) hypertension: Secondary | ICD-10-CM | POA: Diagnosis not present

## 2014-11-18 DIAGNOSIS — M1990 Unspecified osteoarthritis, unspecified site: Secondary | ICD-10-CM | POA: Diagnosis not present

## 2014-11-20 DIAGNOSIS — I10 Essential (primary) hypertension: Secondary | ICD-10-CM | POA: Diagnosis not present

## 2014-11-20 DIAGNOSIS — F0391 Unspecified dementia with behavioral disturbance: Secondary | ICD-10-CM | POA: Diagnosis not present

## 2014-11-20 DIAGNOSIS — M1990 Unspecified osteoarthritis, unspecified site: Secondary | ICD-10-CM | POA: Diagnosis not present

## 2014-11-20 DIAGNOSIS — E119 Type 2 diabetes mellitus without complications: Secondary | ICD-10-CM | POA: Diagnosis not present

## 2014-11-20 DIAGNOSIS — I2510 Atherosclerotic heart disease of native coronary artery without angina pectoris: Secondary | ICD-10-CM | POA: Diagnosis not present

## 2014-11-20 DIAGNOSIS — F329 Major depressive disorder, single episode, unspecified: Secondary | ICD-10-CM | POA: Diagnosis not present

## 2014-11-24 DIAGNOSIS — F419 Anxiety disorder, unspecified: Secondary | ICD-10-CM | POA: Diagnosis not present

## 2014-11-24 DIAGNOSIS — F329 Major depressive disorder, single episode, unspecified: Secondary | ICD-10-CM | POA: Diagnosis not present

## 2014-11-26 DIAGNOSIS — F0391 Unspecified dementia with behavioral disturbance: Secondary | ICD-10-CM | POA: Diagnosis not present

## 2014-11-26 DIAGNOSIS — F329 Major depressive disorder, single episode, unspecified: Secondary | ICD-10-CM | POA: Diagnosis not present

## 2014-11-26 DIAGNOSIS — M1990 Unspecified osteoarthritis, unspecified site: Secondary | ICD-10-CM | POA: Diagnosis not present

## 2014-11-26 DIAGNOSIS — I10 Essential (primary) hypertension: Secondary | ICD-10-CM | POA: Diagnosis not present

## 2014-11-26 DIAGNOSIS — E119 Type 2 diabetes mellitus without complications: Secondary | ICD-10-CM | POA: Diagnosis not present

## 2014-11-26 DIAGNOSIS — I2510 Atherosclerotic heart disease of native coronary artery without angina pectoris: Secondary | ICD-10-CM | POA: Diagnosis not present

## 2014-11-28 DIAGNOSIS — F0391 Unspecified dementia with behavioral disturbance: Secondary | ICD-10-CM | POA: Diagnosis not present

## 2014-11-28 DIAGNOSIS — I10 Essential (primary) hypertension: Secondary | ICD-10-CM | POA: Diagnosis not present

## 2014-11-28 DIAGNOSIS — I2510 Atherosclerotic heart disease of native coronary artery without angina pectoris: Secondary | ICD-10-CM | POA: Diagnosis not present

## 2014-11-28 DIAGNOSIS — E119 Type 2 diabetes mellitus without complications: Secondary | ICD-10-CM | POA: Diagnosis not present

## 2014-11-28 DIAGNOSIS — M1990 Unspecified osteoarthritis, unspecified site: Secondary | ICD-10-CM | POA: Diagnosis not present

## 2014-11-28 DIAGNOSIS — F329 Major depressive disorder, single episode, unspecified: Secondary | ICD-10-CM | POA: Diagnosis not present

## 2014-12-04 DIAGNOSIS — I10 Essential (primary) hypertension: Secondary | ICD-10-CM | POA: Diagnosis not present

## 2014-12-04 DIAGNOSIS — E119 Type 2 diabetes mellitus without complications: Secondary | ICD-10-CM | POA: Diagnosis not present

## 2014-12-04 DIAGNOSIS — M1990 Unspecified osteoarthritis, unspecified site: Secondary | ICD-10-CM | POA: Diagnosis not present

## 2014-12-04 DIAGNOSIS — F0391 Unspecified dementia with behavioral disturbance: Secondary | ICD-10-CM | POA: Diagnosis not present

## 2014-12-04 DIAGNOSIS — I2510 Atherosclerotic heart disease of native coronary artery without angina pectoris: Secondary | ICD-10-CM | POA: Diagnosis not present

## 2014-12-04 DIAGNOSIS — F329 Major depressive disorder, single episode, unspecified: Secondary | ICD-10-CM | POA: Diagnosis not present

## 2014-12-05 DIAGNOSIS — F0391 Unspecified dementia with behavioral disturbance: Secondary | ICD-10-CM | POA: Diagnosis not present

## 2014-12-05 DIAGNOSIS — F329 Major depressive disorder, single episode, unspecified: Secondary | ICD-10-CM | POA: Diagnosis not present

## 2014-12-05 DIAGNOSIS — I10 Essential (primary) hypertension: Secondary | ICD-10-CM | POA: Diagnosis not present

## 2014-12-05 DIAGNOSIS — E119 Type 2 diabetes mellitus without complications: Secondary | ICD-10-CM | POA: Diagnosis not present

## 2014-12-05 DIAGNOSIS — I2510 Atherosclerotic heart disease of native coronary artery without angina pectoris: Secondary | ICD-10-CM | POA: Diagnosis not present

## 2014-12-05 DIAGNOSIS — M1990 Unspecified osteoarthritis, unspecified site: Secondary | ICD-10-CM | POA: Diagnosis not present

## 2014-12-06 DIAGNOSIS — F329 Major depressive disorder, single episode, unspecified: Secondary | ICD-10-CM | POA: Diagnosis not present

## 2014-12-06 DIAGNOSIS — F419 Anxiety disorder, unspecified: Secondary | ICD-10-CM | POA: Diagnosis not present

## 2014-12-08 DIAGNOSIS — F0391 Unspecified dementia with behavioral disturbance: Secondary | ICD-10-CM | POA: Diagnosis not present

## 2014-12-08 DIAGNOSIS — I2510 Atherosclerotic heart disease of native coronary artery without angina pectoris: Secondary | ICD-10-CM | POA: Diagnosis not present

## 2014-12-08 DIAGNOSIS — F329 Major depressive disorder, single episode, unspecified: Secondary | ICD-10-CM | POA: Diagnosis not present

## 2014-12-08 DIAGNOSIS — I10 Essential (primary) hypertension: Secondary | ICD-10-CM | POA: Diagnosis not present

## 2014-12-08 DIAGNOSIS — M1990 Unspecified osteoarthritis, unspecified site: Secondary | ICD-10-CM | POA: Diagnosis not present

## 2014-12-08 DIAGNOSIS — F419 Anxiety disorder, unspecified: Secondary | ICD-10-CM | POA: Diagnosis not present

## 2014-12-08 DIAGNOSIS — E119 Type 2 diabetes mellitus without complications: Secondary | ICD-10-CM | POA: Diagnosis not present

## 2014-12-10 DIAGNOSIS — E119 Type 2 diabetes mellitus without complications: Secondary | ICD-10-CM | POA: Diagnosis not present

## 2014-12-10 DIAGNOSIS — M1990 Unspecified osteoarthritis, unspecified site: Secondary | ICD-10-CM | POA: Diagnosis not present

## 2014-12-10 DIAGNOSIS — F0391 Unspecified dementia with behavioral disturbance: Secondary | ICD-10-CM | POA: Diagnosis not present

## 2014-12-10 DIAGNOSIS — I2510 Atherosclerotic heart disease of native coronary artery without angina pectoris: Secondary | ICD-10-CM | POA: Diagnosis not present

## 2014-12-10 DIAGNOSIS — F329 Major depressive disorder, single episode, unspecified: Secondary | ICD-10-CM | POA: Diagnosis not present

## 2014-12-10 DIAGNOSIS — I10 Essential (primary) hypertension: Secondary | ICD-10-CM | POA: Diagnosis not present

## 2014-12-15 DIAGNOSIS — F17210 Nicotine dependence, cigarettes, uncomplicated: Secondary | ICD-10-CM | POA: Diagnosis not present

## 2014-12-15 DIAGNOSIS — E785 Hyperlipidemia, unspecified: Secondary | ICD-10-CM | POA: Diagnosis not present

## 2014-12-15 DIAGNOSIS — J069 Acute upper respiratory infection, unspecified: Secondary | ICD-10-CM | POA: Diagnosis not present

## 2014-12-15 DIAGNOSIS — F329 Major depressive disorder, single episode, unspecified: Secondary | ICD-10-CM | POA: Diagnosis not present

## 2014-12-15 DIAGNOSIS — E1159 Type 2 diabetes mellitus with other circulatory complications: Secondary | ICD-10-CM | POA: Diagnosis not present

## 2014-12-15 DIAGNOSIS — I2510 Atherosclerotic heart disease of native coronary artery without angina pectoris: Secondary | ICD-10-CM | POA: Diagnosis not present

## 2014-12-15 DIAGNOSIS — N183 Chronic kidney disease, stage 3 (moderate): Secondary | ICD-10-CM | POA: Diagnosis not present

## 2014-12-15 DIAGNOSIS — F419 Anxiety disorder, unspecified: Secondary | ICD-10-CM | POA: Diagnosis not present

## 2014-12-15 DIAGNOSIS — I130 Hypertensive heart and chronic kidney disease with heart failure and stage 1 through stage 4 chronic kidney disease, or unspecified chronic kidney disease: Secondary | ICD-10-CM | POA: Diagnosis not present

## 2014-12-17 DIAGNOSIS — I10 Essential (primary) hypertension: Secondary | ICD-10-CM | POA: Diagnosis not present

## 2014-12-17 DIAGNOSIS — I2510 Atherosclerotic heart disease of native coronary artery without angina pectoris: Secondary | ICD-10-CM | POA: Diagnosis not present

## 2014-12-17 DIAGNOSIS — F0391 Unspecified dementia with behavioral disturbance: Secondary | ICD-10-CM | POA: Diagnosis not present

## 2014-12-17 DIAGNOSIS — M1990 Unspecified osteoarthritis, unspecified site: Secondary | ICD-10-CM | POA: Diagnosis not present

## 2014-12-17 DIAGNOSIS — F329 Major depressive disorder, single episode, unspecified: Secondary | ICD-10-CM | POA: Diagnosis not present

## 2014-12-17 DIAGNOSIS — E119 Type 2 diabetes mellitus without complications: Secondary | ICD-10-CM | POA: Diagnosis not present

## 2014-12-19 DIAGNOSIS — F0391 Unspecified dementia with behavioral disturbance: Secondary | ICD-10-CM | POA: Diagnosis not present

## 2014-12-19 DIAGNOSIS — I10 Essential (primary) hypertension: Secondary | ICD-10-CM | POA: Diagnosis not present

## 2014-12-19 DIAGNOSIS — I2510 Atherosclerotic heart disease of native coronary artery without angina pectoris: Secondary | ICD-10-CM | POA: Diagnosis not present

## 2014-12-19 DIAGNOSIS — F329 Major depressive disorder, single episode, unspecified: Secondary | ICD-10-CM | POA: Diagnosis not present

## 2014-12-19 DIAGNOSIS — M1990 Unspecified osteoarthritis, unspecified site: Secondary | ICD-10-CM | POA: Diagnosis not present

## 2014-12-19 DIAGNOSIS — E119 Type 2 diabetes mellitus without complications: Secondary | ICD-10-CM | POA: Diagnosis not present

## 2014-12-22 DIAGNOSIS — F329 Major depressive disorder, single episode, unspecified: Secondary | ICD-10-CM | POA: Diagnosis not present

## 2014-12-22 DIAGNOSIS — N183 Chronic kidney disease, stage 3 (moderate): Secondary | ICD-10-CM | POA: Diagnosis not present

## 2014-12-22 DIAGNOSIS — R600 Localized edema: Secondary | ICD-10-CM | POA: Diagnosis not present

## 2014-12-22 DIAGNOSIS — I130 Hypertensive heart and chronic kidney disease with heart failure and stage 1 through stage 4 chronic kidney disease, or unspecified chronic kidney disease: Secondary | ICD-10-CM | POA: Diagnosis not present

## 2014-12-22 DIAGNOSIS — E1159 Type 2 diabetes mellitus with other circulatory complications: Secondary | ICD-10-CM | POA: Diagnosis not present

## 2014-12-22 DIAGNOSIS — E785 Hyperlipidemia, unspecified: Secondary | ICD-10-CM | POA: Diagnosis not present

## 2014-12-22 DIAGNOSIS — I739 Peripheral vascular disease, unspecified: Secondary | ICD-10-CM | POA: Diagnosis not present

## 2014-12-22 DIAGNOSIS — F419 Anxiety disorder, unspecified: Secondary | ICD-10-CM | POA: Diagnosis not present

## 2014-12-22 DIAGNOSIS — I2510 Atherosclerotic heart disease of native coronary artery without angina pectoris: Secondary | ICD-10-CM | POA: Diagnosis not present

## 2014-12-23 DIAGNOSIS — D649 Anemia, unspecified: Secondary | ICD-10-CM | POA: Diagnosis not present

## 2014-12-23 DIAGNOSIS — I10 Essential (primary) hypertension: Secondary | ICD-10-CM | POA: Diagnosis not present

## 2014-12-24 DIAGNOSIS — M1990 Unspecified osteoarthritis, unspecified site: Secondary | ICD-10-CM | POA: Diagnosis not present

## 2014-12-24 DIAGNOSIS — F0391 Unspecified dementia with behavioral disturbance: Secondary | ICD-10-CM | POA: Diagnosis not present

## 2014-12-24 DIAGNOSIS — E119 Type 2 diabetes mellitus without complications: Secondary | ICD-10-CM | POA: Diagnosis not present

## 2014-12-24 DIAGNOSIS — I10 Essential (primary) hypertension: Secondary | ICD-10-CM | POA: Diagnosis not present

## 2014-12-24 DIAGNOSIS — I2510 Atherosclerotic heart disease of native coronary artery without angina pectoris: Secondary | ICD-10-CM | POA: Diagnosis not present

## 2014-12-24 DIAGNOSIS — F329 Major depressive disorder, single episode, unspecified: Secondary | ICD-10-CM | POA: Diagnosis not present

## 2014-12-26 DIAGNOSIS — F0391 Unspecified dementia with behavioral disturbance: Secondary | ICD-10-CM | POA: Diagnosis not present

## 2014-12-26 DIAGNOSIS — F329 Major depressive disorder, single episode, unspecified: Secondary | ICD-10-CM | POA: Diagnosis not present

## 2014-12-26 DIAGNOSIS — I10 Essential (primary) hypertension: Secondary | ICD-10-CM | POA: Diagnosis not present

## 2014-12-26 DIAGNOSIS — M1990 Unspecified osteoarthritis, unspecified site: Secondary | ICD-10-CM | POA: Diagnosis not present

## 2014-12-26 DIAGNOSIS — E119 Type 2 diabetes mellitus without complications: Secondary | ICD-10-CM | POA: Diagnosis not present

## 2014-12-26 DIAGNOSIS — I2510 Atherosclerotic heart disease of native coronary artery without angina pectoris: Secondary | ICD-10-CM | POA: Diagnosis not present

## 2014-12-30 DIAGNOSIS — F0391 Unspecified dementia with behavioral disturbance: Secondary | ICD-10-CM | POA: Diagnosis not present

## 2014-12-30 DIAGNOSIS — I10 Essential (primary) hypertension: Secondary | ICD-10-CM | POA: Diagnosis not present

## 2014-12-30 DIAGNOSIS — I2510 Atherosclerotic heart disease of native coronary artery without angina pectoris: Secondary | ICD-10-CM | POA: Diagnosis not present

## 2014-12-30 DIAGNOSIS — F329 Major depressive disorder, single episode, unspecified: Secondary | ICD-10-CM | POA: Diagnosis not present

## 2014-12-30 DIAGNOSIS — M1990 Unspecified osteoarthritis, unspecified site: Secondary | ICD-10-CM | POA: Diagnosis not present

## 2014-12-30 DIAGNOSIS — E119 Type 2 diabetes mellitus without complications: Secondary | ICD-10-CM | POA: Diagnosis not present

## 2015-01-02 DIAGNOSIS — I2510 Atherosclerotic heart disease of native coronary artery without angina pectoris: Secondary | ICD-10-CM | POA: Diagnosis not present

## 2015-01-02 DIAGNOSIS — E119 Type 2 diabetes mellitus without complications: Secondary | ICD-10-CM | POA: Diagnosis not present

## 2015-01-02 DIAGNOSIS — F0391 Unspecified dementia with behavioral disturbance: Secondary | ICD-10-CM | POA: Diagnosis not present

## 2015-01-02 DIAGNOSIS — M1990 Unspecified osteoarthritis, unspecified site: Secondary | ICD-10-CM | POA: Diagnosis not present

## 2015-01-02 DIAGNOSIS — F329 Major depressive disorder, single episode, unspecified: Secondary | ICD-10-CM | POA: Diagnosis not present

## 2015-01-02 DIAGNOSIS — I10 Essential (primary) hypertension: Secondary | ICD-10-CM | POA: Diagnosis not present

## 2015-01-03 DIAGNOSIS — F419 Anxiety disorder, unspecified: Secondary | ICD-10-CM | POA: Diagnosis not present

## 2015-01-03 DIAGNOSIS — F329 Major depressive disorder, single episode, unspecified: Secondary | ICD-10-CM | POA: Diagnosis not present

## 2015-01-05 DIAGNOSIS — F329 Major depressive disorder, single episode, unspecified: Secondary | ICD-10-CM | POA: Diagnosis not present

## 2015-01-05 DIAGNOSIS — F419 Anxiety disorder, unspecified: Secondary | ICD-10-CM | POA: Diagnosis not present

## 2015-01-06 DIAGNOSIS — I10 Essential (primary) hypertension: Secondary | ICD-10-CM | POA: Diagnosis not present

## 2015-01-06 DIAGNOSIS — F329 Major depressive disorder, single episode, unspecified: Secondary | ICD-10-CM | POA: Diagnosis not present

## 2015-01-06 DIAGNOSIS — F0391 Unspecified dementia with behavioral disturbance: Secondary | ICD-10-CM | POA: Diagnosis not present

## 2015-01-06 DIAGNOSIS — E119 Type 2 diabetes mellitus without complications: Secondary | ICD-10-CM | POA: Diagnosis not present

## 2015-01-06 DIAGNOSIS — I2510 Atherosclerotic heart disease of native coronary artery without angina pectoris: Secondary | ICD-10-CM | POA: Diagnosis not present

## 2015-01-06 DIAGNOSIS — M1990 Unspecified osteoarthritis, unspecified site: Secondary | ICD-10-CM | POA: Diagnosis not present

## 2015-01-08 DIAGNOSIS — E119 Type 2 diabetes mellitus without complications: Secondary | ICD-10-CM | POA: Diagnosis not present

## 2015-01-08 DIAGNOSIS — F329 Major depressive disorder, single episode, unspecified: Secondary | ICD-10-CM | POA: Diagnosis not present

## 2015-01-08 DIAGNOSIS — M1990 Unspecified osteoarthritis, unspecified site: Secondary | ICD-10-CM | POA: Diagnosis not present

## 2015-01-08 DIAGNOSIS — I2510 Atherosclerotic heart disease of native coronary artery without angina pectoris: Secondary | ICD-10-CM | POA: Diagnosis not present

## 2015-01-08 DIAGNOSIS — F0391 Unspecified dementia with behavioral disturbance: Secondary | ICD-10-CM | POA: Diagnosis not present

## 2015-01-08 DIAGNOSIS — I10 Essential (primary) hypertension: Secondary | ICD-10-CM | POA: Diagnosis not present

## 2015-01-12 DIAGNOSIS — I739 Peripheral vascular disease, unspecified: Secondary | ICD-10-CM | POA: Diagnosis not present

## 2015-01-12 DIAGNOSIS — I2510 Atherosclerotic heart disease of native coronary artery without angina pectoris: Secondary | ICD-10-CM | POA: Diagnosis not present

## 2015-01-12 DIAGNOSIS — R600 Localized edema: Secondary | ICD-10-CM | POA: Diagnosis not present

## 2015-01-12 DIAGNOSIS — F419 Anxiety disorder, unspecified: Secondary | ICD-10-CM | POA: Diagnosis not present

## 2015-01-12 DIAGNOSIS — I130 Hypertensive heart and chronic kidney disease with heart failure and stage 1 through stage 4 chronic kidney disease, or unspecified chronic kidney disease: Secondary | ICD-10-CM | POA: Diagnosis not present

## 2015-01-12 DIAGNOSIS — E785 Hyperlipidemia, unspecified: Secondary | ICD-10-CM | POA: Diagnosis not present

## 2015-01-12 DIAGNOSIS — E1122 Type 2 diabetes mellitus with diabetic chronic kidney disease: Secondary | ICD-10-CM | POA: Diagnosis not present

## 2015-01-12 DIAGNOSIS — F329 Major depressive disorder, single episode, unspecified: Secondary | ICD-10-CM | POA: Diagnosis not present

## 2015-01-12 DIAGNOSIS — E1159 Type 2 diabetes mellitus with other circulatory complications: Secondary | ICD-10-CM | POA: Diagnosis not present

## 2015-01-12 DIAGNOSIS — N183 Chronic kidney disease, stage 3 (moderate): Secondary | ICD-10-CM | POA: Diagnosis not present

## 2015-01-13 ENCOUNTER — Ambulatory Visit: Payer: Medicare Other | Admitting: Cardiology

## 2015-01-13 DIAGNOSIS — E1159 Type 2 diabetes mellitus with other circulatory complications: Secondary | ICD-10-CM | POA: Diagnosis not present

## 2015-01-19 ENCOUNTER — Encounter (HOSPITAL_COMMUNITY): Payer: Self-pay | Admitting: Emergency Medicine

## 2015-01-19 ENCOUNTER — Emergency Department (HOSPITAL_COMMUNITY)
Admission: EM | Admit: 2015-01-19 | Discharge: 2015-01-19 | Disposition: A | Discharge status: Home / Self Care | Payer: Medicare Other | Source: Home / Self Care | Attending: Emergency Medicine | Admitting: Emergency Medicine

## 2015-01-19 ENCOUNTER — Emergency Department (HOSPITAL_COMMUNITY): Payer: Medicare Other

## 2015-01-19 DIAGNOSIS — M1990 Unspecified osteoarthritis, unspecified site: Secondary | ICD-10-CM

## 2015-01-19 DIAGNOSIS — F0390 Unspecified dementia without behavioral disturbance: Secondary | ICD-10-CM

## 2015-01-19 DIAGNOSIS — N390 Urinary tract infection, site not specified: Secondary | ICD-10-CM | POA: Diagnosis not present

## 2015-01-19 DIAGNOSIS — Y92129 Unspecified place in nursing home as the place of occurrence of the external cause: Secondary | ICD-10-CM

## 2015-01-19 DIAGNOSIS — E785 Hyperlipidemia, unspecified: Secondary | ICD-10-CM | POA: Insufficient documentation

## 2015-01-19 DIAGNOSIS — E1149 Type 2 diabetes mellitus with other diabetic neurological complication: Secondary | ICD-10-CM | POA: Diagnosis present

## 2015-01-19 DIAGNOSIS — J449 Chronic obstructive pulmonary disease, unspecified: Secondary | ICD-10-CM | POA: Diagnosis not present

## 2015-01-19 DIAGNOSIS — E860 Dehydration: Secondary | ICD-10-CM | POA: Diagnosis present

## 2015-01-19 DIAGNOSIS — S8392XA Sprain of unspecified site of left knee, initial encounter: Secondary | ICD-10-CM | POA: Insufficient documentation

## 2015-01-19 DIAGNOSIS — Z7982 Long term (current) use of aspirin: Secondary | ICD-10-CM | POA: Insufficient documentation

## 2015-01-19 DIAGNOSIS — Z880 Allergy status to penicillin: Secondary | ICD-10-CM

## 2015-01-19 DIAGNOSIS — I2510 Atherosclerotic heart disease of native coronary artery without angina pectoris: Secondary | ICD-10-CM | POA: Diagnosis present

## 2015-01-19 DIAGNOSIS — Z720 Tobacco use: Secondary | ICD-10-CM

## 2015-01-19 DIAGNOSIS — E875 Hyperkalemia: Secondary | ICD-10-CM | POA: Diagnosis present

## 2015-01-19 DIAGNOSIS — Y939 Activity, unspecified: Secondary | ICD-10-CM

## 2015-01-19 DIAGNOSIS — F17210 Nicotine dependence, cigarettes, uncomplicated: Secondary | ICD-10-CM | POA: Diagnosis present

## 2015-01-19 DIAGNOSIS — M25562 Pain in left knee: Secondary | ICD-10-CM | POA: Diagnosis not present

## 2015-01-19 DIAGNOSIS — Z955 Presence of coronary angioplasty implant and graft: Secondary | ICD-10-CM

## 2015-01-19 DIAGNOSIS — Y999 Unspecified external cause status: Secondary | ICD-10-CM

## 2015-01-19 DIAGNOSIS — N179 Acute kidney failure, unspecified: Secondary | ICD-10-CM | POA: Diagnosis not present

## 2015-01-19 DIAGNOSIS — W19XXXA Unspecified fall, initial encounter: Secondary | ICD-10-CM

## 2015-01-19 DIAGNOSIS — G9341 Metabolic encephalopathy: Secondary | ICD-10-CM | POA: Diagnosis present

## 2015-01-19 DIAGNOSIS — F419 Anxiety disorder, unspecified: Secondary | ICD-10-CM | POA: Diagnosis not present

## 2015-01-19 DIAGNOSIS — M542 Cervicalgia: Secondary | ICD-10-CM | POA: Diagnosis not present

## 2015-01-19 DIAGNOSIS — Z9889 Other specified postprocedural states: Secondary | ICD-10-CM | POA: Insufficient documentation

## 2015-01-19 DIAGNOSIS — F329 Major depressive disorder, single episode, unspecified: Secondary | ICD-10-CM | POA: Diagnosis present

## 2015-01-19 DIAGNOSIS — I4891 Unspecified atrial fibrillation: Secondary | ICD-10-CM | POA: Diagnosis present

## 2015-01-19 DIAGNOSIS — E041 Nontoxic single thyroid nodule: Secondary | ICD-10-CM

## 2015-01-19 DIAGNOSIS — S79912A Unspecified injury of left hip, initial encounter: Secondary | ICD-10-CM | POA: Diagnosis not present

## 2015-01-19 DIAGNOSIS — W1839XA Other fall on same level, initial encounter: Secondary | ICD-10-CM

## 2015-01-19 DIAGNOSIS — E119 Type 2 diabetes mellitus without complications: Secondary | ICD-10-CM

## 2015-01-19 DIAGNOSIS — Z794 Long term (current) use of insulin: Secondary | ICD-10-CM | POA: Insufficient documentation

## 2015-01-19 DIAGNOSIS — S8992XA Unspecified injury of left lower leg, initial encounter: Secondary | ICD-10-CM | POA: Diagnosis not present

## 2015-01-19 DIAGNOSIS — Z8673 Personal history of transient ischemic attack (TIA), and cerebral infarction without residual deficits: Secondary | ICD-10-CM

## 2015-01-19 DIAGNOSIS — Z90710 Acquired absence of both cervix and uterus: Secondary | ICD-10-CM

## 2015-01-19 DIAGNOSIS — M25552 Pain in left hip: Secondary | ICD-10-CM | POA: Diagnosis not present

## 2015-01-19 DIAGNOSIS — K219 Gastro-esophageal reflux disease without esophagitis: Secondary | ICD-10-CM | POA: Diagnosis present

## 2015-01-19 DIAGNOSIS — Z79899 Other long term (current) drug therapy: Secondary | ICD-10-CM

## 2015-01-19 DIAGNOSIS — I10 Essential (primary) hypertension: Secondary | ICD-10-CM | POA: Diagnosis present

## 2015-01-19 DIAGNOSIS — R52 Pain, unspecified: Secondary | ICD-10-CM

## 2015-01-19 DIAGNOSIS — T148 Other injury of unspecified body region: Secondary | ICD-10-CM | POA: Diagnosis not present

## 2015-01-19 DIAGNOSIS — S199XXA Unspecified injury of neck, initial encounter: Secondary | ICD-10-CM | POA: Diagnosis not present

## 2015-01-19 DIAGNOSIS — R531 Weakness: Secondary | ICD-10-CM | POA: Diagnosis not present

## 2015-01-19 NOTE — ED Notes (Signed)
[facility] [address], fall this morning after having bilateral leg pain. Hx dementia.

## 2015-01-19 NOTE — ED Notes (Signed)
[facility] called  

## 2015-01-19 NOTE — ED Provider Notes (Signed)
CSN: [id number]     Arrival date & time 01/19/15  4098 History   First MD Initiated Contact with Patient 01/19/15 1001     Chief Complaint  Patient presents with  . Leg Pain  . Fall  LEVEL 5 CAVEAT - DEMENTIA   Patient is a 81 y.o. female presenting with leg pain and fall. The history is provided by the patient and the nursing home.  Leg Pain Location:  Leg and knee Pain details:    Quality:  Aching   Radiates to:  Does not radiate   Severity:  Moderate   Onset quality:  Gradual   Timing:  Constant   Progression:  Unchanged Relieved by:  Rest Worsened by:  Activity Associated symptoms: neck pain   Associated symptoms: no back pain   Fall Pertinent negatives include no chest pain and no headaches.  Pt presents from nursing facility for fall Per nursing staff at facility, pt was reported leg pain She was placed in a chair When nursing staff checked on her she was in the floor No head injury.  No altered mental status She is not on anticoagulants She can ambulate independently on most day  Currently, pt reports neck pain and leg pain.   No HA No head injury No LOC  Past Medical History  Diagnosis Date  . CORONARY ARTERY DISEASE 07/20/2010    with stent  . DEPRESSION 07/20/2010  . DM (diabetes mellitus) type II controlled, neurological manifestation 07/20/2010  . GERD 07/20/2010  . HYPERLIPIDEMIA 07/20/2010  . HYPERTENSION 07/20/2010  . LOW BACK PAIN 07/20/2010  . OSTEOARTHRITIS 07/20/2010  . URINARY INCONTINENCE 07/20/2010  . TIA (transient ischemic attack)   . CAD (coronary artery disease)     Stenting 2008 (no details)  . Dementia   . Hypokalemia   . Spinal stenosis   . Tobacco abuse   . Varicose vein of leg    Past Surgical History  Procedure Laterality Date  . Abdominal hysterectomy    . Vitrectomy Left   . Total knee arthroplasty    . Coronary stent placement    . Varicose vein surgery     Family History  Problem Relation Age of Onset  . Hypertension  Mother   . Diabetes Mother   . Breast cancer Sister    History  Substance Use Topics  . Smoking status: Current Every Day Smoker -- 0.50 packs/day    Types: Cigarettes  . Smokeless tobacco: Never Used     Comment: smokes 2-3 per day  . Alcohol Use: No   OB History    No data available     Review of Systems  Unable to perform ROS: Dementia  Cardiovascular: Negative for chest pain.  Musculoskeletal: Positive for arthralgias and neck pain. Negative for back pain.  Neurological: Negative for headaches.      Allergies  Penicillins  Home Medications   Prior to Admission medications   Medication Sig Start Date End Date Taking? Authorizing Provider  acetaminophen (TYLENOL) 325 MG tablet Take 650 mg by mouth every 4 (four) hours as needed for pain.    Historical Provider, MD  albuterol (PROVENTIL) (2.5 MG/3ML) 0.083% nebulizer solution Take 3 mLs (2.5 mg total) by nebulization every 4 (four) hours as needed for wheezing or shortness of breath. 08/18/13   Renae Fickle, MD  aspirin 81 MG chewable tablet Chew 81 mg by mouth daily.    Historical Provider, MD  benzonatate (TESSALON) 100 MG capsule Take 1 capsule (100 mg  total) by mouth every 8 (eight) hours. 11/13/14   Roxy Horseman, PA-C  cholecalciferol (VITAMIN D) 1000 UNITS tablet Take 1,000 Units by mouth daily.    Historical Provider, MD  cloNIDine (CATAPRES) 0.2 MG tablet TAKE 1 TABLET BY MOUTH TWICE A DAY.    Rollene Rotunda, MD  diltiazem (CARDIZEM CD) 120 MG 24 hr capsule Take 1 capsule (120 mg total) by mouth daily. 10/01/13   Leroy Sea, MD  DULoxetine (CYMBALTA) 60 MG capsule Take 1 capsule (60 mg total) by mouth daily. 09/04/12   Rollene Rotunda, MD  felodipine (PLENDIL) 10 MG 24 hr tablet Take 1 tablet (10 mg total) by mouth daily. 09/04/12   Rollene Rotunda, MD  fenofibrate (TRICOR) 48 MG tablet Take 48 mg by mouth at bedtime.    Historical Provider, MD  furosemide (LASIX) 20 MG tablet Take 20 mg by mouth daily at 12  noon.    Historical Provider, MD  furosemide (LASIX) 20 MG tablet Take 10 mg by mouth daily as needed (for weight gain of 3 or more pounds in one day in addition to regular dose).    Historical Provider, MD  furosemide (LASIX) 40 MG tablet Take 1 tablet (40 mg total) by mouth daily. 10/01/13   Leroy Sea, MD  gabapentin (NEURONTIN) 600 MG tablet Take 600 mg by mouth 4 (four) times daily.    Historical Provider, MD  guaifenesin (ROBITUSSIN) 100 MG/5ML syrup Take 200 mg by mouth every 6 (six) hours as needed for cough.    Historical Provider, MD  insulin aspart (NOVOLOG) 100 UNIT/ML injection Inject 10 Units into the skin 3 (three) times daily before meals. Hold if BS is less than 100    Historical Provider, MD  LORazepam (ATIVAN) 0.5 MG tablet Take 0.5 mg by mouth 2 (two) times daily.  09/04/12   Rollene Rotunda, MD  Memantine HCl ER (NAMENDA XR) 28 MG CP24 Take 28 mg by mouth daily.    Historical Provider, MD  metFORMIN (GLUCOPHAGE) 1000 MG tablet Take 1,000 mg by mouth 2 (two) times daily with a meal.    Historical Provider, MD  ondansetron (ZOFRAN) 4 MG tablet Take 4 mg by mouth every 6 (six) hours as needed for nausea or vomiting.    Historical Provider, MD  pantoprazole (PROTONIX) 40 MG tablet Take 1 tablet (40 mg total) by mouth daily. 10/01/13   Leroy Sea, MD  potassium chloride SA (K-DUR,KLOR-CON) 10 MEQ tablet Take 1 tablet (10 mEq total) by mouth daily. Patient taking differently: Take 20 mEq by mouth daily.  10/01/13   Leroy Sea, MD  simvastatin (ZOCOR) 20 MG tablet Take 1 tablet (20 mg total) by mouth daily. Patient taking differently: Take 10 mg by mouth at bedtime.  09/04/12   Rollene Rotunda, MD  sodium chloride (OCEAN) 0.65 % SOLN nasal spray Place 1 spray into both nostrils as needed for congestion. 11/13/14   Roxy Horseman, PA-C  traMADol (ULTRAM) 50 MG tablet Take 100 mg by mouth 4 (four) times daily.     Historical Provider, MD  traZODone (DESYREL) 50 MG tablet Take 50  mg by mouth at bedtime.    Historical Provider, MD  valsartan (DIOVAN) 80 MG tablet Take 1 tablet (80 mg total) by mouth daily. 10/17/13   Nyoka Cowden, MD   BP 103/40 mmHg  Pulse 52  Temp(Src) 98.8 F (37.1 C) (Oral)  Resp 16  SpO2 95% Physical Exam CONSTITUTIONAL: Well developed/well nourished HEAD: Normocephalic/atraumatic EYES: EOMI/PERRL  ENMT: Mucous membranes moist. No signs of facial trauma NECK: c-collar in place SPINE/BACK:mild tenderness to cervical spine.  No thoracic/lumbar tenderness.  No bruising/crepitance/stepoffs noted to spine CV: S1/S2 noted, no murmurs/rubs/gallops noted Chest - nontender to palpation LUNGS: Lungs are clear to auscultation bilaterally, no apparent distress ABDOMEN: soft, nontender, no rebound or guarding, bowel sounds noted throughout abdomen NEURO: Pt is awake/alert, moves all extremitiesx4.  No facial droop.   EXTREMITIES: pulses normal/equal, full ROM.  Mild tenderness to palpation of left knee.  No deformity noted SKIN: warm, color normal   ED Course  10:33 AM Pt is s/p mechanical fall Reporting neck pain and left LE pain Pain is localized to left knee I ambulated patient and she reports most of pain in left knee.  She is comfortable appearing Nurse at facility reported pt had pain in "groin area" Will image cspine and also image left hip/knee  Procedures (including critical care time)  11:16 AM Imaging negative Pt already ambulatory Stable for d/c   Imaging Review Ct Cervical Spine Wo Contrast  01/19/2015   CLINICAL DATA:  Pain following fall  EXAM: CT CERVICAL SPINE WITHOUT CONTRAST  TECHNIQUE: Multidetector CT imaging of the cervical spine was performed without intravenous contrast. Multiplanar CT image reconstructions were also generated.  COMPARISON:  Cervical spine radiographs October 10, 2013  FINDINGS: There is no fracture or spondylolisthesis. Prevertebral soft tissues and predental space regions are normal. There are several  cysts in the region of the odontoid and midline C2 vertebral body. No frank erosions seen in this area. There is moderate pannus surrounding the posterior aspect of the odontoid without significant craniocervical junction narrowing. There is moderately severe disc space narrowing at C6-7. There is also moderate narrowing at C3-4. There are multiple anterior osteophytes throughout the cervical and upper thoracic spine regions. There is facet hypertrophy at most levels bilaterally. There is no disc extrusion or stenosis. There is exit foraminal narrowing at C4-5 on the left, C5-6 on the left, and C6-7 on the left, moderately severe. No frank disc extrusion or stenosis. Thyroid appears somewhat inhomogeneous with an apparent nodule in the right lobe of the thyroid measuring approximately 1.5 cm.  IMPRESSION: Multilevel osteoarthritic change. No acute fracture or spondylolisthesis.  Apparent nodular lesion right lobe of thyroid. Consider further evaluation with thyroid ultrasound. If patient is clinically hyperthyroid, consider nuclear medicine thyroid uptake and scan.   Electronically Signed   By: Bretta BangWilliam  Woodruff III M.D.   On: 01/19/2015 11:11   Dg Knee Complete 4 Views Left  01/19/2015   CLINICAL DATA:  Acute left knee pain after fall this morning. Initial encounter.  EXAM: LEFT KNEE - COMPLETE 4+ VIEW  COMPARISON:  None.  FINDINGS: There is no evidence of fracture, dislocation, or joint effusion. Severe narrowing of patellofemoral space is noted as well as medial joint space. Mild narrowing of lateral joint space is noted. Soft tissues are unremarkable.  IMPRESSION: Severe degenerative joint disease is noted. No acute abnormality seen in the left knee.   Electronically Signed   By: Lupita RaiderJames  Green Jr, M.D.   On: 01/19/2015 11:03   Dg Hip Unilat With Pelvis 2-3 Views Left  01/19/2015   CLINICAL DATA:  Pain following fall  EXAM: DG HIP (WITH OR WITHOUT PELVIS) 2-3V LEFT  COMPARISON:  None.  FINDINGS: Frontal  pelvis as well as frontal and lateral left hip images were obtained. There is mild narrowing of both hip joints. There is mild bony overgrowth along the lateral  left acetabulum. There is no demonstrable fracture or dislocation. No erosive change.  IMPRESSION: Mild osteoarthritic change.  No fracture or dislocation.   Electronically Signed   By: Bretta Bang III M.D.   On: 01/19/2015 10:50     EKG Interpretation None      MDM   Final diagnoses:  Pain  Fall, initial encounter  Sprain of left knee, initial encounter    Nursing notes including past medical history and social history reviewed and considered in documentation xrays/imaging reviewed by myself and considered during evaluation     Zadie Rhine, MD 01/19/15 1117

## 2015-01-20 ENCOUNTER — Emergency Department (HOSPITAL_COMMUNITY): Payer: Medicare Other

## 2015-01-20 ENCOUNTER — Encounter (HOSPITAL_COMMUNITY): Payer: Self-pay | Admitting: *Deleted

## 2015-01-20 ENCOUNTER — Inpatient Hospital Stay (HOSPITAL_COMMUNITY)
Admission: EM | Admit: 2015-01-20 | Discharge: 2015-01-23 | DRG: 682 | Disposition: A | Discharge status: Home / Self Care | Payer: Medicare Other | Attending: Internal Medicine | Admitting: Internal Medicine

## 2015-01-20 DIAGNOSIS — K219 Gastro-esophageal reflux disease without esophagitis: Secondary | ICD-10-CM | POA: Diagnosis present

## 2015-01-20 DIAGNOSIS — J449 Chronic obstructive pulmonary disease, unspecified: Secondary | ICD-10-CM | POA: Diagnosis present

## 2015-01-20 DIAGNOSIS — G9341 Metabolic encephalopathy: Secondary | ICD-10-CM | POA: Diagnosis present

## 2015-01-20 DIAGNOSIS — I10 Essential (primary) hypertension: Secondary | ICD-10-CM | POA: Diagnosis present

## 2015-01-20 DIAGNOSIS — I4891 Unspecified atrial fibrillation: Secondary | ICD-10-CM | POA: Diagnosis present

## 2015-01-20 DIAGNOSIS — Z880 Allergy status to penicillin: Secondary | ICD-10-CM | POA: Diagnosis not present

## 2015-01-20 DIAGNOSIS — F17210 Nicotine dependence, cigarettes, uncomplicated: Secondary | ICD-10-CM | POA: Diagnosis present

## 2015-01-20 DIAGNOSIS — Z90710 Acquired absence of both cervix and uterus: Secondary | ICD-10-CM | POA: Diagnosis not present

## 2015-01-20 DIAGNOSIS — E875 Hyperkalemia: Secondary | ICD-10-CM | POA: Diagnosis present

## 2015-01-20 DIAGNOSIS — Z8673 Personal history of transient ischemic attack (TIA), and cerebral infarction without residual deficits: Secondary | ICD-10-CM | POA: Diagnosis not present

## 2015-01-20 DIAGNOSIS — N179 Acute kidney failure, unspecified: Secondary | ICD-10-CM | POA: Diagnosis not present

## 2015-01-20 DIAGNOSIS — E860 Dehydration: Secondary | ICD-10-CM

## 2015-01-20 DIAGNOSIS — E1149 Type 2 diabetes mellitus with other diabetic neurological complication: Secondary | ICD-10-CM | POA: Diagnosis present

## 2015-01-20 DIAGNOSIS — I6789 Other cerebrovascular disease: Secondary | ICD-10-CM | POA: Diagnosis not present

## 2015-01-20 DIAGNOSIS — Z794 Long term (current) use of insulin: Secondary | ICD-10-CM | POA: Diagnosis not present

## 2015-01-20 DIAGNOSIS — W19XXXA Unspecified fall, initial encounter: Secondary | ICD-10-CM | POA: Diagnosis present

## 2015-01-20 DIAGNOSIS — N390 Urinary tract infection, site not specified: Secondary | ICD-10-CM | POA: Diagnosis present

## 2015-01-20 DIAGNOSIS — E785 Hyperlipidemia, unspecified: Secondary | ICD-10-CM | POA: Diagnosis present

## 2015-01-20 DIAGNOSIS — M1990 Unspecified osteoarthritis, unspecified site: Secondary | ICD-10-CM | POA: Diagnosis present

## 2015-01-20 DIAGNOSIS — Z955 Presence of coronary angioplasty implant and graft: Secondary | ICD-10-CM | POA: Diagnosis not present

## 2015-01-20 DIAGNOSIS — E041 Nontoxic single thyroid nodule: Secondary | ICD-10-CM

## 2015-01-20 DIAGNOSIS — E119 Type 2 diabetes mellitus without complications: Secondary | ICD-10-CM

## 2015-01-20 DIAGNOSIS — F0390 Unspecified dementia without behavioral disturbance: Secondary | ICD-10-CM | POA: Diagnosis present

## 2015-01-20 DIAGNOSIS — Z7982 Long term (current) use of aspirin: Secondary | ICD-10-CM | POA: Diagnosis not present

## 2015-01-20 DIAGNOSIS — E042 Nontoxic multinodular goiter: Secondary | ICD-10-CM | POA: Diagnosis present

## 2015-01-20 DIAGNOSIS — I2510 Atherosclerotic heart disease of native coronary artery without angina pectoris: Secondary | ICD-10-CM | POA: Diagnosis present

## 2015-01-20 DIAGNOSIS — R531 Weakness: Secondary | ICD-10-CM | POA: Diagnosis not present

## 2015-01-20 DIAGNOSIS — F329 Major depressive disorder, single episode, unspecified: Secondary | ICD-10-CM | POA: Diagnosis present

## 2015-01-20 DIAGNOSIS — F4489 Other dissociative and conversion disorders: Secondary | ICD-10-CM | POA: Diagnosis not present

## 2015-01-20 LAB — BASIC METABOLIC PANEL
ANION GAP: 13 (ref 5–15)
BUN: 43 mg/dL — AB (ref 6–20)
CO2: 23 mmol/L (ref 22–32)
Calcium: 9.1 mg/dL (ref 8.9–10.3)
Chloride: 100 mmol/L — ABNORMAL LOW (ref 101–111)
Creatinine, Ser: 1.98 mg/dL — ABNORMAL HIGH (ref 0.44–1.00)
GFR calc Af Amer: 26 mL/min — ABNORMAL LOW (ref >60)
GFR, EST NON AFRICAN AMERICAN: 23 mL/min — AB (ref >60)
GLUCOSE: 108 mg/dL — AB (ref 65–99)
POTASSIUM: 5.2 mmol/L — AB (ref 3.5–5.1)
SODIUM: 136 mmol/L (ref 135–145)

## 2015-01-20 LAB — CBC
HEMATOCRIT: 39.6 % (ref 36.0–46.0)
Hemoglobin: 12.7 g/dL (ref 12.0–15.0)
MCH: 27.2 pg (ref 26.0–34.0)
MCHC: 32.1 g/dL (ref 30.0–36.0)
MCV: 84.8 fL (ref 78.0–100.0)
Platelets: 281 10*3/uL (ref 150–400)
RBC: 4.67 MIL/uL (ref 3.87–5.11)
RDW: 15 % (ref 11.5–15.5)
WBC: 10.5 10*3/uL (ref 4.0–10.5)

## 2015-01-20 LAB — URINE MICROSCOPIC-ADD ON

## 2015-01-20 LAB — URINALYSIS, ROUTINE W REFLEX MICROSCOPIC
BILIRUBIN URINE: NEGATIVE
Glucose, UA: NEGATIVE mg/dL
Hgb urine dipstick: NEGATIVE
Ketones, ur: NEGATIVE mg/dL
NITRITE: NEGATIVE
PH: 5 (ref 5.0–8.0)
Protein, ur: NEGATIVE mg/dL
Specific Gravity, Urine: 1.009 (ref 1.005–1.030)
UROBILINOGEN UA: 0.2 mg/dL (ref 0.0–1.0)

## 2015-01-20 LAB — I-STAT TROPONIN, ED: Troponin i, poc: 0.05 ng/mL (ref 0.00–0.08)

## 2015-01-20 LAB — GLUCOSE, CAPILLARY: Glucose-Capillary: 153 mg/dL — ABNORMAL HIGH (ref 65–99)

## 2015-01-20 MED ORDER — ACETAMINOPHEN 650 MG RE SUPP: 650.0000 mg | Freq: Four times a day (QID) | RECTAL | Status: DC | PRN

## 2015-01-20 MED ORDER — HEPARIN SODIUM (PORCINE) 5000 UNIT/ML IJ SOLN
5000.0000 [IU] | Freq: Three times a day (TID) | INTRAMUSCULAR | Status: DC
  Administered 2015-01-20 – 2015-01-21 (×4): 5000 [IU] via SUBCUTANEOUS
  Filled 2015-01-20 (×11): qty 1

## 2015-01-20 MED ORDER — SENNOSIDES-DOCUSATE SODIUM 8.6-50 MG PO TABS: 1.0000 | ORAL_TABLET | Freq: Every evening | ORAL | Status: DC | PRN

## 2015-01-20 MED ORDER — DILTIAZEM HCL ER COATED BEADS 120 MG PO CP24
120.0000 mg | ORAL_CAPSULE | Freq: Every day | ORAL | Status: DC
  Administered 2015-01-21 – 2015-01-23 (×3): 120 mg via ORAL
  Filled 2015-01-20 (×3): qty 1

## 2015-01-20 MED ORDER — INSULIN ASPART 100 UNIT/ML ~~LOC~~ SOLN: 0.0000 [IU] | Freq: Every day | SUBCUTANEOUS | Status: DC

## 2015-01-20 MED ORDER — FENOFIBRATE 54 MG PO TABS
54.0000 mg | ORAL_TABLET | Freq: Every day | ORAL | Status: DC
  Administered 2015-01-21 – 2015-01-23 (×3): 54 mg via ORAL
  Filled 2015-01-20 (×3): qty 1

## 2015-01-20 MED ORDER — INSULIN ASPART 100 UNIT/ML ~~LOC~~ SOLN
0.0000 [IU] | Freq: Three times a day (TID) | SUBCUTANEOUS | Status: DC
  Administered 2015-01-21 (×2): 3 [IU] via SUBCUTANEOUS
  Administered 2015-01-21 – 2015-01-23 (×5): 2 [IU] via SUBCUTANEOUS

## 2015-01-20 MED ORDER — ASPIRIN 81 MG PO CHEW
81.0000 mg | CHEWABLE_TABLET | Freq: Every day | ORAL | Status: DC
  Administered 2015-01-20 – 2015-01-23 (×4): 81 mg via ORAL
  Filled 2015-01-20 (×4): qty 1

## 2015-01-20 MED ORDER — HYDROMORPHONE HCL 1 MG/ML IJ SOLN: 1.0000 mg | Freq: Once | INTRAMUSCULAR | Status: DC

## 2015-01-20 MED ORDER — PROMETHAZINE HCL 25 MG PO TABS: 12.5000 mg | ORAL_TABLET | Freq: Four times a day (QID) | ORAL | Status: DC | PRN

## 2015-01-20 MED ORDER — LEVOFLOXACIN IN D5W 500 MG/100ML IV SOLN
500.0000 mg | INTRAVENOUS | Status: DC
  Filled 2015-01-20: qty 100

## 2015-01-20 MED ORDER — DULOXETINE HCL 60 MG PO CPEP
60.0000 mg | ORAL_CAPSULE | Freq: Every day | ORAL | Status: DC
  Administered 2015-01-20 – 2015-01-23 (×4): 60 mg via ORAL
  Filled 2015-01-20 (×4): qty 1

## 2015-01-20 MED ORDER — HYDRALAZINE HCL 20 MG/ML IJ SOLN: 5.0000 mg | Freq: Four times a day (QID) | INTRAMUSCULAR | Status: DC | PRN

## 2015-01-20 MED ORDER — CLONIDINE HCL 0.2 MG PO TABS
0.2000 mg | ORAL_TABLET | Freq: Two times a day (BID) | ORAL | Status: DC
  Administered 2015-01-20 – 2015-01-23 (×6): 0.2 mg via ORAL
  Filled 2015-01-20 (×7): qty 1

## 2015-01-20 MED ORDER — MEMANTINE HCL ER 28 MG PO CP24
28.0000 mg | ORAL_CAPSULE | Freq: Every day | ORAL | Status: DC
  Administered 2015-01-21 – 2015-01-23 (×3): 28 mg via ORAL
  Filled 2015-01-20 (×3): qty 1

## 2015-01-20 MED ORDER — FELODIPINE ER 10 MG PO TB24
10.0000 mg | ORAL_TABLET | Freq: Every day | ORAL | Status: DC
  Administered 2015-01-21 – 2015-01-23 (×3): 10 mg via ORAL
  Filled 2015-01-20 (×3): qty 1

## 2015-01-20 MED ORDER — ACETAMINOPHEN 325 MG PO TABS: 650.0000 mg | ORAL_TABLET | Freq: Four times a day (QID) | ORAL | Status: DC | PRN

## 2015-01-20 MED ORDER — GABAPENTIN 300 MG PO CAPS
600.0000 mg | ORAL_CAPSULE | Freq: Two times a day (BID) | ORAL | Status: DC
  Administered 2015-01-20 – 2015-01-23 (×6): 600 mg via ORAL
  Filled 2015-01-20 (×7): qty 2

## 2015-01-20 MED ORDER — SODIUM CHLORIDE 0.9 % IV SOLN
INTRAVENOUS | Status: DC
  Administered 2015-01-21: 06:00:00 via INTRAVENOUS

## 2015-01-20 MED ORDER — ONDANSETRON HCL 4 MG PO TABS: 4.0000 mg | ORAL_TABLET | Freq: Four times a day (QID) | ORAL | Status: DC | PRN

## 2015-01-20 MED ORDER — SALINE SPRAY 0.65 % NA SOLN
1.0000 | NASAL | Status: DC | PRN
  Filled 2015-01-20: qty 44

## 2015-01-20 MED ORDER — ONDANSETRON HCL 4 MG/2ML IJ SOLN: 4.0000 mg | Freq: Once | INTRAMUSCULAR | Status: DC

## 2015-01-20 MED ORDER — SODIUM CHLORIDE 0.9 % IV BOLUS (SEPSIS)
1000.0000 mL | Freq: Once | INTRAVENOUS | Status: AC
  Administered 2015-01-20: 1000 mL via INTRAVENOUS

## 2015-01-20 MED ORDER — CIPROFLOXACIN IN D5W 400 MG/200ML IV SOLN
400.0000 mg | Freq: Once | INTRAVENOUS | Status: AC
  Administered 2015-01-20: 400 mg via INTRAVENOUS
  Filled 2015-01-20: qty 200

## 2015-01-20 MED ORDER — ALUM & MAG HYDROXIDE-SIMETH 200-200-20 MG/5ML PO SUSP: 30.0000 mL | Freq: Four times a day (QID) | ORAL | Status: DC | PRN

## 2015-01-20 MED ORDER — LORAZEPAM 0.5 MG PO TABS
0.5000 mg | ORAL_TABLET | Freq: Two times a day (BID) | ORAL | Status: DC | PRN
  Administered 2015-01-21 (×2): 0.5 mg via ORAL
  Filled 2015-01-20 (×2): qty 1

## 2015-01-20 NOTE — ED Provider Notes (Signed)
CSN: [id number]     Arrival date & time 01/20/15  1612 History   First MD Initiated Contact with Patient 01/20/15 1620     Chief Complaint  Patient presents with  . Fall     (Consider location/radiation/quality/duration/timing/severity/associated sxs/prior Treatment) HPI Comments: Found in her room at her facility. Denies any pain. She stated the rubber soles on her shoes tripped up on the floor.  Patient is a 81 y.o. female presenting with fall. The history is provided by the patient.  Fall This is a recurrent problem. The current episode started less than 1 hour ago. The problem occurs constantly. The problem has been resolved. Pertinent negatives include no chest pain and no abdominal pain. Nothing aggravates the symptoms. Nothing relieves the symptoms.    Past Medical History  Diagnosis Date  . CORONARY ARTERY DISEASE 07/20/2010    with stent  . DEPRESSION 07/20/2010  . DM (diabetes mellitus) type II controlled, neurological manifestation 07/20/2010  . GERD 07/20/2010  . HYPERLIPIDEMIA 07/20/2010  . HYPERTENSION 07/20/2010  . LOW BACK PAIN 07/20/2010  . OSTEOARTHRITIS 07/20/2010  . URINARY INCONTINENCE 07/20/2010  . TIA (transient ischemic attack)   . CAD (coronary artery disease)     Stenting 2008 (no details)  . Dementia   . Hypokalemia   . Spinal stenosis   . Tobacco abuse   . Varicose vein of leg    Past Surgical History  Procedure Laterality Date  . Abdominal hysterectomy    . Vitrectomy Left   . Total knee arthroplasty    . Coronary stent placement    . Varicose vein surgery     Family History  Problem Relation Age of Onset  . Hypertension Mother   . Diabetes Mother   . Breast cancer Sister    History  Substance Use Topics  . Smoking status: Current Every Day Smoker -- 0.50 packs/day    Types: Cigarettes  . Smokeless tobacco: Never Used     Comment: smokes 2-3 per day  . Alcohol Use: No   OB History    No data available     Review of Systems  Unable  to perform ROS: Dementia  Cardiovascular: Negative for chest pain.  Gastrointestinal: Negative for abdominal pain.      Allergies  Penicillins  Home Medications   Prior to Admission medications   Medication Sig Start Date End Date Taking? Authorizing Provider  acetaminophen (TYLENOL) 325 MG tablet Take 650 mg by mouth every 4 (four) hours as needed for pain.   Yes Historical Provider, MD  albuterol (PROVENTIL) (2.5 MG/3ML) 0.083% nebulizer solution Take 3 mLs (2.5 mg total) by nebulization every 4 (four) hours as needed for wheezing or shortness of breath. 08/18/13  Yes Renae Fickle, MD  aspirin 81 MG chewable tablet Chew 81 mg by mouth daily.   Yes Historical Provider, MD  cholecalciferol (VITAMIN D) 1000 UNITS tablet Take 1,000 Units by mouth daily.   Yes Historical Provider, MD  Cholecalciferol (VITAMIN D-3) 1000 UNITS CAPS Take 1,000 Units by mouth daily at 12 noon.    Yes Historical Provider, MD  cloNIDine (CATAPRES) 0.2 MG tablet TAKE 1 TABLET BY MOUTH TWICE A DAY. Patient taking differently: TAKE 0.2 MG BY MOUTH TWICE DAILY.   Yes Rollene Rotunda, MD  diltiazem (CARDIZEM CD) 120 MG 24 hr capsule Take 1 capsule (120 mg total) by mouth daily. 10/01/13  Yes Leroy Sea, MD  DULoxetine (CYMBALTA) 60 MG capsule Take 1 capsule (60 mg total) by mouth  daily. 09/04/12  Yes Rollene RotundaJames Hochrein, MD  felodipine (PLENDIL) 10 MG 24 hr tablet Take 1 tablet (10 mg total) by mouth daily. 09/04/12  Yes Rollene RotundaJames Hochrein, MD  fenofibrate (TRICOR) 48 MG tablet Take 48 mg by mouth at bedtime.   Yes Historical Provider, MD  furosemide (LASIX) 20 MG tablet Take 10 mg by mouth daily at 12 noon. Take half a tablet (10mg ) by mouth as needed for weight gain of 3 or more pounds in one day, in addition to regular dose   Yes Historical Provider, MD  furosemide (LASIX) 20 MG tablet Take 40 mg by mouth daily.    Yes Historical Provider, MD  furosemide (LASIX) 40 MG tablet Take 1 tablet (40 mg total) by mouth daily.  10/01/13  Yes Leroy SeaPrashant K Singh, MD  gabapentin (NEURONTIN) 600 MG tablet Take 600 mg by mouth 4 (four) times daily.   Yes Historical Provider, MD  guaiFENesin (ROBITUSSIN) 100 MG/5ML liquid Take 200 mg by mouth 4 (four) times daily as needed for cough.   Yes Historical Provider, MD  insulin aspart (NOVOLOG) 100 UNIT/ML injection Inject 10 Units into the skin 3 (three) times daily before meals. Hold if BS is less than 100   Yes Historical Provider, MD  LORazepam (ATIVAN) 0.5 MG tablet Take 0.5 mg by mouth 2 (two) times daily.  09/04/12  Yes Rollene RotundaJames Hochrein, MD  Memantine HCl ER (NAMENDA XR) 28 MG CP24 Take 28 mg by mouth daily.   Yes Historical Provider, MD  metFORMIN (GLUCOPHAGE) 1000 MG tablet Take 1,000 mg by mouth 2 (two) times daily with a meal.   Yes Historical Provider, MD  ondansetron (ZOFRAN) 4 MG tablet Take 4 mg by mouth every 6 (six) hours as needed for nausea or vomiting.   Yes Historical Provider, MD  potassium chloride SA (K-DUR,KLOR-CON) 10 MEQ tablet Take 1 tablet (10 mEq total) by mouth daily. Patient taking differently: Take 20 mEq by mouth daily.  10/01/13  Yes Leroy SeaPrashant K Singh, MD  simvastatin (ZOCOR) 20 MG tablet Take 1 tablet (20 mg total) by mouth daily. Patient taking differently: Take 10 mg by mouth at bedtime.  09/04/12  Yes Rollene RotundaJames Hochrein, MD  sodium chloride (OCEAN) 0.65 % SOLN nasal spray Place 1 spray into both nostrils as needed for congestion. 11/13/14  Yes Roxy Horsemanobert Browning, PA-C  traMADol (ULTRAM) 50 MG tablet Take 100 mg by mouth 4 (four) times daily.    Yes Historical Provider, MD  traZODone (DESYREL) 50 MG tablet Take 50 mg by mouth at bedtime.   Yes Historical Provider, MD  valsartan (DIOVAN) 80 MG tablet Take 1 tablet (80 mg total) by mouth daily. 10/17/13  Yes Nyoka CowdenMichael B Wert, MD  benzonatate (TESSALON) 100 MG capsule Take 1 capsule (100 mg total) by mouth every 8 (eight) hours. Patient not taking: Reported on 01/19/2015 11/13/14   Roxy Horsemanobert Browning, PA-C  pantoprazole  (PROTONIX) 40 MG tablet Take 1 tablet (40 mg total) by mouth daily. Patient not taking: Reported on 01/19/2015 10/01/13   Leroy SeaPrashant K Singh, MD   BP 145/56 mmHg  Pulse 69  Temp(Src) 98.9 F (37.2 C) (Oral)  Resp 16  SpO2 97% Physical Exam  Constitutional: She appears well-developed and well-nourished. No distress.  HENT:  Head: Normocephalic and atraumatic.  Mouth/Throat: Oropharynx is clear and moist.  Eyes: EOM are normal. Pupils are equal, round, and reactive to light.  Neck: Normal range of motion. Neck supple.  Cardiovascular: Normal rate and regular rhythm.  Exam reveals no  friction rub.   No murmur heard. Pulmonary/Chest: Effort normal and breath sounds normal. No respiratory distress. She has no wheezes. She has no rales.  Abdominal: Soft. She exhibits no distension. There is no tenderness. There is no rebound.  Musculoskeletal: Normal range of motion. She exhibits no edema.  Neurological: She is alert. She exhibits normal muscle tone.  Skin: No rash noted. She is not diaphoretic.  Nursing note and vitals reviewed.   ED Course  Procedures (including critical care time) Labs Review Labs Reviewed - No data to display  Imaging Review Ct Cervical Spine Wo Contrast  01/19/2015   CLINICAL DATA:  Pain following fall  EXAM: CT CERVICAL SPINE WITHOUT CONTRAST  TECHNIQUE: Multidetector CT imaging of the cervical spine was performed without intravenous contrast. Multiplanar CT image reconstructions were also generated.  COMPARISON:  Cervical spine radiographs October 10, 2013  FINDINGS: There is no fracture or spondylolisthesis. Prevertebral soft tissues and predental space regions are normal. There are several cysts in the region of the odontoid and midline C2 vertebral body. No frank erosions seen in this area. There is moderate pannus surrounding the posterior aspect of the odontoid without significant craniocervical junction narrowing. There is moderately severe disc space narrowing at  C6-7. There is also moderate narrowing at C3-4. There are multiple anterior osteophytes throughout the cervical and upper thoracic spine regions. There is facet hypertrophy at most levels bilaterally. There is no disc extrusion or stenosis. There is exit foraminal narrowing at C4-5 on the left, C5-6 on the left, and C6-7 on the left, moderately severe. No frank disc extrusion or stenosis. Thyroid appears somewhat inhomogeneous with an apparent nodule in the right lobe of the thyroid measuring approximately 1.5 cm.  IMPRESSION: Multilevel osteoarthritic change. No acute fracture or spondylolisthesis.  Apparent nodular lesion right lobe of thyroid. Consider further evaluation with thyroid ultrasound. If patient is clinically hyperthyroid, consider nuclear medicine thyroid uptake and scan.   Electronically Signed   By: Bretta Bang III M.D.   On: 01/19/2015 11:11   Dg Knee Complete 4 Views Left  01/19/2015   CLINICAL DATA:  Acute left knee pain after fall this morning. Initial encounter.  EXAM: LEFT KNEE - COMPLETE 4+ VIEW  COMPARISON:  None.  FINDINGS: There is no evidence of fracture, dislocation, or joint effusion. Severe narrowing of patellofemoral space is noted as well as medial joint space. Mild narrowing of lateral joint space is noted. Soft tissues are unremarkable.  IMPRESSION: Severe degenerative joint disease is noted. No acute abnormality seen in the left knee.   Electronically Signed   By: Lupita Raider, M.D.   On: 01/19/2015 11:03   Dg Hip Unilat With Pelvis 2-3 Views Left  01/19/2015   CLINICAL DATA:  Pain following fall  EXAM: DG HIP (WITH OR WITHOUT PELVIS) 2-3V LEFT  COMPARISON:  None.  FINDINGS: Frontal pelvis as well as frontal and lateral left hip images were obtained. There is mild narrowing of both hip joints. There is mild bony overgrowth along the lateral left acetabulum. There is no demonstrable fracture or dislocation. No erosive change.  IMPRESSION: Mild osteoarthritic change.   No fracture or dislocation.   Electronically Signed   By: Bretta Bang III M.D.   On: 01/19/2015 10:50     EKG Interpretation   Date/Time:  Tuesday January 20 2015 16:59:10 EDT Ventricular Rate:  70 PR Interval:  184 QRS Duration: 134 QT Interval:  463 QTC Calculation: 500 R Axis:   100 Text  Interpretation:  Sinus rhythm Left bundle branch block No significant  change since last tracing Confirmed by Gwendolyn Grant  MD, Drequan Ironside ([id number]) on  01/20/2015 5:44:04 PM      MDM   Final diagnoses:  UTI (lower urinary tract infection)  Dehydration    81 year old female here after an unwitnessed fall. She feels well and denies having any pain. She was seen yesterday for fall also. She is normally ambulating independently. Patient well-appearing. No signs of trauma. No extremity deformity. Will make sure she can ambulate and tolerate PO. I spoke with the nursing home - they reported she's been weak lately. UA shows UTI. Labs show ARF. Admitted for hydration, antibiotics.  Elwin Mocha, MD 01/21/15 0000

## 2015-01-20 NOTE — Progress Notes (Signed)
ED CM noted [facility]  [facility] SW [facility] Please follow for discharge needs. CM spoke with pt who [facility]

## 2015-01-20 NOTE — ED Notes (Signed)
Pt requesting to go home, states she does not want blood work drawn and is not in pain.

## 2015-01-20 NOTE — H&P (Signed)
PCP:   Florentina Jenny, MD   Chief Complaint:  Fall, weak  HPI: This is a pleasantly confused 81 year old female with dementia. She resides at a nursing home. She apparently fell yesterday and today. she was sent to the ER. Workup reveals acute kidney injury with mild hyperkalemia and possible UTI. The hospitalist has been asked to admit. Unable to obtain a history from the patient due to her dementia.  Review of Systems:  Unable to obtain due to dementia  Past Medical History: Past Medical History  Diagnosis Date  . CORONARY ARTERY DISEASE 07/20/2010    with stent  . DEPRESSION 07/20/2010  . DM (diabetes mellitus) type II controlled, neurological manifestation 07/20/2010  . GERD 07/20/2010  . HYPERLIPIDEMIA 07/20/2010  . HYPERTENSION 07/20/2010  . LOW BACK PAIN 07/20/2010  . OSTEOARTHRITIS 07/20/2010  . URINARY INCONTINENCE 07/20/2010  . TIA (transient ischemic attack)   . CAD (coronary artery disease)     Stenting 2008 (no details)  . Dementia   . Hypokalemia   . Spinal stenosis   . Tobacco abuse   . Varicose vein of leg    Past Surgical History  Procedure Laterality Date  . Abdominal hysterectomy    . Vitrectomy Left   . Total knee arthroplasty    . Coronary stent placement    . Varicose vein surgery      Medications: Prior to Admission medications   Medication Sig Start Date End Date Taking? Authorizing Provider  acetaminophen (TYLENOL) 325 MG tablet Take 650 mg by mouth every 4 (four) hours as needed for pain.   Yes Historical Provider, MD  albuterol (PROVENTIL) (2.5 MG/3ML) 0.083% nebulizer solution Take 3 mLs (2.5 mg total) by nebulization every 4 (four) hours as needed for wheezing or shortness of breath. 08/18/13  Yes Renae Fickle, MD  aspirin 81 MG chewable tablet Chew 81 mg by mouth daily.   Yes Historical Provider, MD  cholecalciferol (VITAMIN D) 1000 UNITS tablet Take 1,000 Units by mouth daily.   Yes Historical Provider, MD  Cholecalciferol (VITAMIN D-3) 1000  UNITS CAPS Take 1,000 Units by mouth daily at 12 noon.    Yes Historical Provider, MD  cloNIDine (CATAPRES) 0.2 MG tablet TAKE 1 TABLET BY MOUTH TWICE A DAY. Patient taking differently: TAKE 0.2 MG BY MOUTH TWICE DAILY.   Yes Rollene Rotunda, MD  diltiazem (CARDIZEM CD) 120 MG 24 hr capsule Take 1 capsule (120 mg total) by mouth daily. 10/01/13  Yes Leroy Sea, MD  DULoxetine (CYMBALTA) 60 MG capsule Take 1 capsule (60 mg total) by mouth daily. 09/04/12  Yes Rollene Rotunda, MD  felodipine (PLENDIL) 10 MG 24 hr tablet Take 1 tablet (10 mg total) by mouth daily. 09/04/12  Yes Rollene Rotunda, MD  fenofibrate (TRICOR) 48 MG tablet Take 48 mg by mouth at bedtime.   Yes Historical Provider, MD  furosemide (LASIX) 20 MG tablet Take 20 mg by mouth 2 (two) times daily as needed (Weight gain of 3lbs in one day). Takes 20 mg every day at noon. Pt may have an additional dose of 20 mg for weight gain of 3lbs or more in one day   Yes Historical Provider, MD  furosemide (LASIX) 40 MG tablet Take 1 tablet (40 mg total) by mouth daily. 10/01/13  Yes Leroy Sea, MD  gabapentin (NEURONTIN) 600 MG tablet Take 600 mg by mouth 4 (four) times daily.   Yes Historical Provider, MD  guaiFENesin (ROBITUSSIN) 100 MG/5ML liquid Take 200 mg by mouth  4 (four) times daily as needed for cough.   Yes Historical Provider, MD  insulin aspart (NOVOLOG) 100 UNIT/ML injection Inject 10 Units into the skin 3 (three) times daily before meals. Hold if BS is less than 100   Yes Historical Provider, MD  LORazepam (ATIVAN) 0.5 MG tablet Take 0.5 mg by mouth 2 (two) times daily.  09/04/12  Yes Rollene Rotunda, MD  Memantine HCl ER (NAMENDA XR) 28 MG CP24 Take 28 mg by mouth daily.   Yes Historical Provider, MD  metFORMIN (GLUCOPHAGE) 1000 MG tablet Take 1,000 mg by mouth 2 (two) times daily with a meal.   Yes Historical Provider, MD  ondansetron (ZOFRAN) 4 MG tablet Take 4 mg by mouth every 6 (six) hours as needed for nausea or vomiting.   Yes  Historical Provider, MD  potassium chloride SA (K-DUR,KLOR-CON) 10 MEQ tablet Take 1 tablet (10 mEq total) by mouth daily. Patient taking differently: Take 20 mEq by mouth daily.  10/01/13  Yes Leroy Sea, MD  simvastatin (ZOCOR) 20 MG tablet Take 1 tablet (20 mg total) by mouth daily. Patient taking differently: Take 10 mg by mouth at bedtime.  09/04/12  Yes Rollene Rotunda, MD  sodium chloride (OCEAN) 0.65 % SOLN nasal spray Place 1 spray into both nostrils as needed for congestion. 11/13/14  Yes Roxy Horseman, PA-C  traMADol (ULTRAM) 50 MG tablet Take 100 mg by mouth 4 (four) times daily.    Yes Historical Provider, MD  traZODone (DESYREL) 50 MG tablet Take 50 mg by mouth at bedtime.   Yes Historical Provider, MD  valsartan (DIOVAN) 80 MG tablet Take 1 tablet (80 mg total) by mouth daily. 10/17/13  Yes Nyoka Cowden, MD  benzonatate (TESSALON) 100 MG capsule Take 1 capsule (100 mg total) by mouth every 8 (eight) hours. Patient not taking: Reported on 01/19/2015 11/13/14   Roxy Horseman, PA-C  pantoprazole (PROTONIX) 40 MG tablet Take 1 tablet (40 mg total) by mouth daily. Patient not taking: Reported on 01/19/2015 10/01/13   Leroy Sea, MD    Allergies:   Allergies  Allergen Reactions  . Penicillins Anaphylaxis    Social History:  reports that she has been smoking Cigarettes.  She has been smoking about 0.50 packs per day. She has never used smokeless tobacco. She reports that she does not drink alcohol or use illicit drugs.  Family History: Family History  Problem Relation Age of Onset  . Hypertension Mother   . Diabetes Mother   . Breast cancer Sister     Physical Exam: Filed Vitals:   01/20/15 1619 01/20/15 1918  BP: 145/56 140/52  Pulse: 69 66  Temp: 98.9 F (37.2 C)   TempSrc: Oral   Resp: 16 14  SpO2: 97% 97%    General:  Pleasantly confused/demented Eyes: PERRLA, pink conjunctiva, no scleral icterus ENT: Moist oral mucosa, neck supple, no  thyromegaly Lungs: clear to ascultation, no wheeze, no crackles, no use of accessory muscles Cardiovascular: regular rate and rhythm, no regurgitation, no gallops, no murmurs. No carotid bruits, no JVD Abdomen: soft, positive BS, non-tender, non-distended, no organomegaly, not an acute abdomen GU: not examined Neuro: Unable to evaluate due to patient's dementia  Musculoskeletal: Unable to urinate properly due to patient's dementia Skin: no rash, no subcutaneous crepitation, no decubitus Psych: Demented patient   Labs on Admission:   Recent Labs  01/20/15 1820  NA 136  K 5.2*  CL 100*  CO2 23  GLUCOSE 108*  BUN  43*  CREATININE 1.98*  CALCIUM 9.1   No results for input(s): AST, ALT, ALKPHOS, BILITOT, PROT, ALBUMIN in the last 72 hours. No results for input(s): LIPASE, AMYLASE in the last 72 hours.  Recent Labs  01/20/15 1820  WBC 10.5  HGB 12.7  HCT 39.6  MCV 84.8  PLT 281     Micro Results: No results found for this or any previous visit (from the past 240 hour(s)). Results for Nicholes StairsFOSTER, Charletta A (MRN [id number]) as of 01/20/2015 20:44  Ref. Range 01/20/2015 18:22  Appearance Latest Ref Range: CLEAR  CLOUDY (A)  Bilirubin Urine Latest Ref Range: NEGATIVE  NEGATIVE  Color, Urine Latest Ref Range: YELLOW  YELLOW  Glucose Latest Ref Range: NEGATIVE mg/dL NEGATIVE  Hgb urine dipstick Latest Ref Range: NEGATIVE  NEGATIVE  Ketones, ur Latest Ref Range: NEGATIVE mg/dL NEGATIVE  Leukocytes, UA Latest Ref Range: NEGATIVE  MODERATE (A)  Nitrite Latest Ref Range: NEGATIVE  NEGATIVE  pH Latest Ref Range: 5.0-8.0  5.0  Protein Latest Ref Range: NEGATIVE mg/dL NEGATIVE  Specific Gravity, Urine Latest Ref Range: 1.005-1.030  1.009  Squamous Epithelial / LPF Latest Ref Range: RARE  RARE  Urobilinogen, UA Latest Ref Range: 0.0-1.0 mg/dL 0.2  WBC, UA Latest Ref Range: <3 WBC/hpf 7-10    Radiological Exams on Admission: Dg Chest 2 View  01/20/2015   CLINICAL DATA:  Unwitnessed fall  at the skilled nursing facility. Current smoker. Current history of hypertension and dementia.  EXAM: CHEST  2 VIEW  COMPARISON:  11/13/2014 and earlier.  FINDINGS: AP semi-erect and lateral images were obtained. Cardiac silhouette moderately enlarged, unchanged. Hilar and mediastinal contours otherwise unremarkable. Emphysematous changes in the upper lobes, unchanged. Lungs clear. Bronchovascular markings normal. Pulmonary vascularity normal. No visible pleural effusions. No pneumothorax. Degenerative changes involving the thoracic spine.  IMPRESSION: Stable cardiomegaly. COPD/emphysema. No acute cardiopulmonary disease.   Electronically Signed   By: Hulan Saashomas  Lawrence M.D.   On: 01/20/2015 18:39   Ct Cervical Spine Wo Contrast  01/19/2015   CLINICAL DATA:  Pain following fall  EXAM: CT CERVICAL SPINE WITHOUT CONTRAST  TECHNIQUE: Multidetector CT imaging of the cervical spine was performed without intravenous contrast. Multiplanar CT image reconstructions were also generated.  COMPARISON:  Cervical spine radiographs October 10, 2013  FINDINGS: There is no fracture or spondylolisthesis. Prevertebral soft tissues and predental space regions are normal. There are several cysts in the region of the odontoid and midline C2 vertebral body. No frank erosions seen in this area. There is moderate pannus surrounding the posterior aspect of the odontoid without significant craniocervical junction narrowing. There is moderately severe disc space narrowing at C6-7. There is also moderate narrowing at C3-4. There are multiple anterior osteophytes throughout the cervical and upper thoracic spine regions. There is facet hypertrophy at most levels bilaterally. There is no disc extrusion or stenosis. There is exit foraminal narrowing at C4-5 on the left, C5-6 on the left, and C6-7 on the left, moderately severe. No frank disc extrusion or stenosis. Thyroid appears somewhat inhomogeneous with an apparent nodule in the right lobe of the  thyroid measuring approximately 1.5 cm.  IMPRESSION: Multilevel osteoarthritic change. No acute fracture or spondylolisthesis.  Apparent nodular lesion right lobe of thyroid. Consider further evaluation with thyroid ultrasound. If patient is clinically hyperthyroid, consider nuclear medicine thyroid uptake and scan.   Electronically Signed   By: Bretta BangWilliam  Woodruff III M.D.   On: 01/19/2015 11:11   Dg Knee Complete 4 Views Left  01/19/2015  CLINICAL DATA:  Acute left knee pain after fall this morning. Initial encounter.  EXAM: LEFT KNEE - COMPLETE 4+ VIEW  COMPARISON:  None.  FINDINGS: There is no evidence of fracture, dislocation, or joint effusion. Severe narrowing of patellofemoral space is noted as well as medial joint space. Mild narrowing of lateral joint space is noted. Soft tissues are unremarkable.  IMPRESSION: Severe degenerative joint disease is noted. No acute abnormality seen in the left knee.   Electronically Signed   By: Lupita Raider, M.D.   On: 01/19/2015 11:03   Dg Hip Unilat With Pelvis 2-3 Views Left  01/19/2015   CLINICAL DATA:  Pain following fall  EXAM: DG HIP (WITH OR WITHOUT PELVIS) 2-3V LEFT  COMPARISON:  None.  FINDINGS: Frontal pelvis as well as frontal and lateral left hip images were obtained. There is mild narrowing of both hip joints. There is mild bony overgrowth along the lateral left acetabulum. There is no demonstrable fracture or dislocation. No erosive change.  IMPRESSION: Mild osteoarthritic change.  No fracture or dislocation.   Electronically Signed   By: Bretta Bang III M.D.   On: 01/19/2015 10:50    Assessment/Plan Present on Admission:  . UTI (lower urinary tract infection) . Metabolic encephalopathy -Levaquin ordered -Await cultures and sensitivity  . Acute kidney injury -Normal saline hydration. -Metformin, lasix, Diovan, tramadol all held. Resume a creatinine has improved Diabetes mellitus -ADA diet, sliding-scale insulin -As stated metformin  held. Resume when creatinine improved  . Atrial fibrillation . Dementia -Stable medications resumed. No anticoagulation  . Essential hypertension -Diovan held, when necessary medications ordered   Quinta Eimer 01/20/2015, 8:37 PM

## 2015-01-20 NOTE — ED Notes (Signed)
Patient transported to X-ray 

## 2015-01-20 NOTE — ED Notes (Signed)
Bed: AO13WA05 Expected date:  Expected time:  Means of arrival:  Comments: EMS- 81yo F, unwitnessed fall, no complaint

## 2015-01-20 NOTE — ED Notes (Signed)
EMS contacted by [facility] from [facility] reporting patient had an unwitnessed fall. No pain reported, pt does report numbness in right hand fingers 123. No bruising and or bumps. No LOC, Hx of dementia

## 2015-01-21 ENCOUNTER — Inpatient Hospital Stay (HOSPITAL_COMMUNITY): Payer: Medicare Other

## 2015-01-21 DIAGNOSIS — E042 Nontoxic multinodular goiter: Secondary | ICD-10-CM | POA: Diagnosis present

## 2015-01-21 DIAGNOSIS — N390 Urinary tract infection, site not specified: Secondary | ICD-10-CM

## 2015-01-21 DIAGNOSIS — N179 Acute kidney failure, unspecified: Principal | ICD-10-CM

## 2015-01-21 LAB — CBC
HCT: 36.1 % (ref 36.0–46.0)
Hemoglobin: 11.8 g/dL — ABNORMAL LOW (ref 12.0–15.0)
MCH: 27.6 pg (ref 26.0–34.0)
MCHC: 32.7 g/dL (ref 30.0–36.0)
MCV: 84.3 fL (ref 78.0–100.0)
Platelets: 260 10*3/uL (ref 150–400)
RBC: 4.28 MIL/uL (ref 3.87–5.11)
RDW: 15.1 % (ref 11.5–15.5)
WBC: 8.7 10*3/uL (ref 4.0–10.5)

## 2015-01-21 LAB — GLUCOSE, CAPILLARY
Glucose-Capillary: 146 mg/dL — ABNORMAL HIGH (ref 65–99)
Glucose-Capillary: 170 mg/dL — ABNORMAL HIGH (ref 65–99)
Glucose-Capillary: 193 mg/dL — ABNORMAL HIGH (ref 65–99)

## 2015-01-21 LAB — TSH: TSH: 0.762 u[IU]/mL (ref 0.350–4.500)

## 2015-01-21 LAB — BASIC METABOLIC PANEL
Anion gap: 8 (ref 5–15)
BUN: 35 mg/dL — ABNORMAL HIGH (ref 6–20)
CO2: 24 mmol/L (ref 22–32)
CREATININE: 1.57 mg/dL — AB (ref 0.44–1.00)
Calcium: 8.4 mg/dL — ABNORMAL LOW (ref 8.9–10.3)
Chloride: 104 mmol/L (ref 101–111)
GFR calc Af Amer: 35 mL/min — ABNORMAL LOW (ref >60)
GFR calc non Af Amer: 30 mL/min — ABNORMAL LOW (ref >60)
Glucose, Bld: 147 mg/dL — ABNORMAL HIGH (ref 65–99)
POTASSIUM: 4.7 mmol/L (ref 3.5–5.1)
SODIUM: 136 mmol/L (ref 135–145)

## 2015-01-21 LAB — T4, FREE: Free T4: 0.79 ng/dL (ref 0.61–1.12)

## 2015-01-21 MED ORDER — NICOTINE 14 MG/24HR TD PT24
14.0000 mg | MEDICATED_PATCH | Freq: Every day | TRANSDERMAL | Status: DC
  Administered 2015-01-21 – 2015-01-23 (×3): 14 mg via TRANSDERMAL
  Filled 2015-01-21 (×3): qty 1

## 2015-01-21 MED ORDER — DIPHENHYDRAMINE HCL 50 MG/ML IJ SOLN
12.5000 mg | Freq: Once | INTRAMUSCULAR | Status: AC
  Administered 2015-01-21: 12.5 mg via INTRAVENOUS
  Filled 2015-01-21: qty 1

## 2015-01-21 MED ORDER — LEVOFLOXACIN IN D5W 500 MG/100ML IV SOLN
500.0000 mg | INTRAVENOUS | Status: DC
  Administered 2015-01-21: 500 mg via INTRAVENOUS
  Filled 2015-01-21: qty 100

## 2015-01-21 MED ORDER — HALOPERIDOL LACTATE 5 MG/ML IJ SOLN
2.0000 mg | Freq: Once | INTRAMUSCULAR | Status: AC
Start: 2015-01-21 — End: 2015-01-21
  Administered 2015-01-21: 2 mg via INTRAVENOUS
  Filled 2015-01-21: qty 1

## 2015-01-21 MED ORDER — LORAZEPAM 1 MG PO TABS
1.0000 mg | ORAL_TABLET | Freq: Two times a day (BID) | ORAL | Status: DC | PRN
  Administered 2015-01-21 – 2015-01-23 (×4): 1 mg via ORAL
  Filled 2015-01-21 (×4): qty 1

## 2015-01-21 NOTE — Progress Notes (Addendum)
[facility] PROGRESS NOTE  Glenda StairsHilda A Underwood ZOX:[id number] DOB: 07/13/1932 DOA: 01/20/2015 PCP: Glenda JennyRIPP, Glenda Underwood  Summary I have seen and examined Glenda Underwood at bedside in the presence of her daughter Glenda Underwood and reviewed her chart. Glenda Underwood is a pleasant 81 year old female with multiple medical problems including diabetes mellitus type 2/at atrial fibrillation/essential hypertension/COPD/tobacco dependency. She came in from an assisted living facility after a fall and was found to have UTI with acute kidney injury resulting in acute metabolic encephalopathy. Concern for neck injury prompted CT of the cervical spine that showed "Multilevel osteoarthritic change. No acute fracture or Spondylolisthesis. Apparent nodular lesion right lobe of thyroid. Consider further evaluation with thyroid ultrasound. If patient is clinically hyperthyroid, consider nuclear medicine thyroid uptake and scan", and this prompted thyroid ultrasound that demonstrated "Findings suggestive of multi nodular goiter asymmetrically involving the right lobe of the thyroid. The dominant approximately 1.6 cm nodule with the mid aspect the right lobe of the thyroid as well as the approximately 1.5 cm nodule within the inferior aspect the right lobe of the thyroid both meet imaging criteria to recommend percutaneous sampling as clinically indicated". Her TSH is normal. In any case patient is on Levaquin/IV fluids and her renal function is improving. She is more with it today. Her daughter would like to eventually take her home. Daughter declined further workup for the abnormal thyroid function, understanding this could be malignant. Physical therapy evaluation is pending. Will continue current antibiotics, discontinue IV fluids, follow physical therapy recommendations and plan discharge home in the next 24-48 hours if patient continues to do well. Plan Metabolic encephalopathy/UTI (lower urinary tract infection)/COPD (chronic obstructive  pulmonary disease)/Acute kidney injury  Discontinue normal saline  Continue Levaquin to complete 7 days of antibiotics  Nicotine patch Essential hypertension/Dementia/Atrial fibrillation/Diabetes  No acute changes  Continue current management Multiple thyroid nodules  No further workup per patient's choice Code Status: Full Code Family Communication: I spoke with patient's daughter Glenda Underwood at the bedside. Disposition Plan: Eventually DC home with home health services   Consultants:  None  Procedures:  None  Antibiotics:  Levaquin 01/20/2015>  HPI/Subjective: Feels better.  Objective: Filed Vitals:   01/21/15 0656  BP: 169/62  Pulse: 64  Temp: 97.5 F (36.4 C)  Resp: 16    Intake/Output Summary (Last 24 hours) at 01/21/15 1223 Last data filed at 01/21/15 0900  Gross per 24 hour  Intake    930 ml  Output   2100 ml  Net  -1170 ml   Filed Weights   01/20/15 2100  Weight: 68.947 kg (152 lb)    Exam:   General:  Comfortable at rest.  Cardiovascular: S1-S2 normal. No murmurs. Pulse regular.  Respiratory: Good air entry bilaterally. No rhonchi or rales.  Abdomen: Soft and nontender. Normal bowel sounds. No organomegaly.  Musculoskeletal: No pedal edema   Neurological: Intact  Data Reviewed: Basic Metabolic Panel:  Recent Labs Lab 01/20/15 1820 01/21/15 0406  NA 136 136  K 5.2* 4.7  CL 100* 104  CO2 23 24  GLUCOSE 108* 147*  BUN 43* 35*  CREATININE 1.98* 1.57*  CALCIUM 9.1 8.4*   Liver Function Tests: No results for input(s): AST, ALT, ALKPHOS, BILITOT, PROT, ALBUMIN in the last 168 hours. No results for input(s): LIPASE, AMYLASE in the last 168 hours. No results for input(s): AMMONIA in the last 168 hours. CBC:  Recent Labs Lab 01/20/15 1820 01/21/15 0406  WBC 10.5 8.7  HGB 12.7 11.8*  HCT 39.6 36.1  MCV 84.8 84.3  PLT 281 260   Cardiac Enzymes: No results for input(s): CKTOTAL, CKMB, CKMBINDEX, TROPONINI in the last 168  hours. BNP (last 3 results) No results for input(s): BNP in the last 8760 hours.  ProBNP (last 3 results) No results for input(s): PROBNP in the last 8760 hours.  CBG:  Recent Labs Lab 01/20/15 2055 01/21/15 0805 01/21/15 1155  GLUCAP 153* 146* 170*    No results found for this or any previous visit (from the past 240 hour(s)).   Studies: Dg Chest 2 View  01/20/2015   CLINICAL DATA:  Unwitnessed fall at the skilled nursing facility. Current smoker. Current history of hypertension and dementia.  EXAM: CHEST  2 VIEW  COMPARISON:  11/13/2014 and earlier.  FINDINGS: AP semi-erect and lateral images were obtained. Cardiac silhouette moderately enlarged, unchanged. Hilar and mediastinal contours otherwise unremarkable. Emphysematous changes in the upper lobes, unchanged. Lungs clear. Bronchovascular markings normal. Pulmonary vascularity normal. No visible pleural effusions. No pneumothorax. Degenerative changes involving the thoracic spine.  IMPRESSION: Stable cardiomegaly. COPD/emphysema. No acute cardiopulmonary disease.   Electronically Signed   By: Glenda Underwood M.D.   On: 01/20/2015 18:39   US Soft Tissue Head/neck  01/21/2015   CLINICAL DATA:  Thyroid nodule  EXAM: THYROID ULTRASOUND  TECHNIQUE: Ultrasound examination of the thyroid gland and adjacent soft tissues was performed.  COMPARISON:  Cervical spine CT - 01/19/2015  FINDINGS: Right thyroid lobe  Measurements: Borderline enlarged measuring 5.2 x 2.3 x 3.1 cm.  Right, superior - 0.4 cm - echogenic, shadowing, likely a dystrophic calcification.  *Right, mid - 1.6 x 1.9 x 1.5 cm - mixed echogenic, solid.  *Right, inferior - 1.5 x 1.3 x 1.5 cm - mixed echogenic, solid  Both of these code dominant right-sided thyroid nodules likely correlate with the abnormal lobular appearance of the right lobe of the thyroid seen on preceding noncontrast cervical spine CT.  Left thyroid lobe  Measurements: Normal in size measuring 3.1 x 1.3 x 1.2 cm   Left, superior - 0.6 x 0.3 x 0.5 cm - anechoic, cystic  Left, mid - 1.1 x 0.8 x 0.7 cm - echogenic, solid.  Left, inferior, anterior - 0.5 x 0.4 x 0.3 cm - anechoic, cystic  Isthmus  Thickness: Normal in size measuring 0.3 cm in diameter.  No discrete nodule or mass is identified within the thyroid isthmus.  Lymphadenopathy  None visualized.  IMPRESSION: Findings suggestive of multi nodular goiter asymmetrically involving the right lobe of the thyroid. The dominant approximately 1.6 cm nodule with the mid aspect the right lobe of the thyroid as well as the approximately 1.5 cm nodule within the inferior aspect the right lobe of the thyroid both meet imaging criteria to recommend percutaneous sampling as clinically indicated. This recommendation follows the consensus statement: Management of Thyroid Nodules Detected at [place]: Society of Radiologists in Ultrasound Consensus Conference Statement. Radiology 2005; [id number].   Electronically Signed   By: Simonne Come M.D.   On: 01/21/2015 10:31    Scheduled Meds: . aspirin  81 mg Oral Daily  . cloNIDine  0.2 mg Oral BID  . diltiazem  120 mg Oral Daily  . DULoxetine  60 mg Oral Daily  . felodipine  10 mg Oral Daily  . fenofibrate  54 mg Oral Daily  . gabapentin  600 mg Oral BID  . heparin  5,000 Units Subcutaneous 3 times per day  . insulin aspart  0-15 Units Subcutaneous TID WC  . insulin  aspart  0-5 Units Subcutaneous QHS  . levofloxacin (LEVAQUIN) IV  500 mg Intravenous Q48H  . memantine  28 mg Oral Daily   Continuous Infusions:    Time spent: 25 minutes    Makailey Hodgkin  [facility] Pager [phone]. If 7PM-7AM, please contact night-coverage at [email or web address], password [facility] 01/21/2015, 12:23 PM  LOS: 1 day

## 2015-01-21 NOTE — Evaluation (Signed)
Physical Therapy Evaluation Patient Details Name: Glenda Underwood MRN: [id number] DOB: 09-22-1932 Today's Date: 01/21/2015   History of Present Illness  81 yo female admitted with UTI, acute kidney injury, fall. Hx of Afib, dementia, COPD, HTN. Pt is from ALF  Clinical Impression  On eval, pt required Min assist for mobility-walked ~50 feet with RW. Small amount of assist to stabilize intermittently while ambulating. Pt tolerated activity fairly well. Daughter present-states she may take pt home with her.     Follow Up Recommendations Home health PT;Supervision/Assistance - 24 hour (if daughter agrees)    Equipment Recommendations  None recommended by PT    Recommendations for Other Services       Precautions / Restrictions Precautions Precautions: Fall Restrictions Weight Bearing Restrictions: No      Mobility  Bed Mobility Overal bed mobility: Needs Assistance Bed Mobility: Supine to Sit;Sit to Supine     Supine to sit: Supervision Sit to supine: Supervision   General bed mobility comments: for safety  Transfers Overall transfer level: Needs assistance Equipment used: Rolling walker (2 wheeled) Transfers: Sit to/from Stand Sit to Stand: Min guard         General transfer comment: close guard for safety  Ambulation/Gait Ambulation/Gait assistance: Min assist Ambulation Distance (Feet): 50 Feet Assistive device: Rolling walker (2 wheeled) Gait Pattern/deviations: Step-through pattern;Decreased stride length     General Gait Details: intermittent assist to stabilize.   Stairs            Wheelchair Mobility    Modified Rankin (Stroke Patients Only)       Balance                                             Pertinent Vitals/Pain Pain Assessment: No/denies pain    Home Living Family/patient expects to be discharged to:: Unsure Living Arrangements: Children Available Help at Discharge: Family             Additional  Comments: Pt is from ALF but daughter mentioned that she may take pt home with her at discharge    Prior Function Level of Independence: Independent with assistive device(s)         Comments: uses RW for ambulation at baseline     Hand Dominance        Extremity/Trunk Assessment   Upper Extremity Assessment: Overall WFL for tasks assessed           Lower Extremity Assessment: Generalized weakness      Cervical / Trunk Assessment: Normal  Communication   Communication: No difficulties  Cognition Arousal/Alertness: Awake/alert Behavior During Therapy: WFL for tasks assessed/performed Overall Cognitive Status: History of cognitive impairments - at baseline                      General Comments      Exercises        Assessment/Plan    PT Assessment Patient needs continued PT services  PT Diagnosis Difficulty walking;Generalized weakness   PT Problem List Decreased strength;Decreased activity tolerance;Decreased balance;Decreased mobility;Decreased knowledge of use of DME;Decreased cognition  PT Treatment Interventions DME instruction;Gait training;Functional mobility training;Therapeutic activities;Therapeutic exercise;Patient/family education;Balance training   PT Goals (Current goals can be found in the Care Plan section) Acute Rehab PT Goals Patient Stated Goal: none stated PT Goal Formulation: With family Time For Goal Achievement: 02/04/15 Potential to  Achieve Goals: Good    Frequency Min 3X/week   Barriers to discharge        Co-evaluation               End of Session Equipment Utilized During Treatment: Gait belt Activity Tolerance: Patient tolerated treatment well Patient left: in bed;with call bell/phone within reach;with bed alarm set;with family/visitor present           Time: [id number] PT Time Calculation (min) (ACUTE ONLY): 9 min   Charges:   PT Evaluation $Initial PT Evaluation Tier I: 1 Procedure     PT G  Codes:        Rebeca AlertJannie Vernisha Bacote, MPT Pager: [phone]

## 2015-01-21 NOTE — Significant Event (Signed)
Patient agitated. Says she wants go home. Believes she is at home. Will give a dose of Haldol(2mg ), benadryl and reassess.

## 2015-01-22 LAB — CBC
HCT: 37.9 % (ref 36.0–46.0)
Hemoglobin: 12.1 g/dL (ref 12.0–15.0)
MCH: 26.5 pg (ref 26.0–34.0)
MCHC: 31.9 g/dL (ref 30.0–36.0)
MCV: 82.9 fL (ref 78.0–100.0)
Platelets: 242 10*3/uL (ref 150–400)
RBC: 4.57 MIL/uL (ref 3.87–5.11)
RDW: 14.8 % (ref 11.5–15.5)
WBC: 8.2 10*3/uL (ref 4.0–10.5)

## 2015-01-22 LAB — GLUCOSE, CAPILLARY
GLUCOSE-CAPILLARY: 135 mg/dL — AB (ref 65–99)
GLUCOSE-CAPILLARY: 112 mg/dL — AB (ref 65–99)
Glucose-Capillary: 113 mg/dL — ABNORMAL HIGH (ref 65–99)
Glucose-Capillary: 136 mg/dL — ABNORMAL HIGH (ref 65–99)

## 2015-01-22 LAB — BASIC METABOLIC PANEL
Anion gap: 8 (ref 5–15)
BUN: 17 mg/dL (ref 6–20)
CALCIUM: 9 mg/dL (ref 8.9–10.3)
CO2: 24 mmol/L (ref 22–32)
CREATININE: 1.11 mg/dL — AB (ref 0.44–1.00)
Chloride: 108 mmol/L (ref 101–111)
GFR calc Af Amer: 53 mL/min — ABNORMAL LOW (ref >60)
GFR calc non Af Amer: 46 mL/min — ABNORMAL LOW (ref >60)
Glucose, Bld: 138 mg/dL — ABNORMAL HIGH (ref 65–99)
Potassium: 4.3 mmol/L (ref 3.5–5.1)
SODIUM: 140 mmol/L (ref 135–145)

## 2015-01-22 LAB — T3: T3, Total: 99 ng/dL (ref 71–180)

## 2015-01-22 MED ORDER — LEVOFLOXACIN 750 MG PO TABS: 750.0000 mg | ORAL_TABLET | ORAL | Status: DC

## 2015-01-22 MED ORDER — LORAZEPAM 2 MG/ML IJ SOLN
INTRAMUSCULAR | Status: AC
  Filled 2015-01-22: qty 1

## 2015-01-22 MED ORDER — LEVOFLOXACIN 500 MG PO TABS
500.0000 mg | ORAL_TABLET | ORAL | Status: DC
  Filled 2015-01-22: qty 1

## 2015-01-22 MED ORDER — LORAZEPAM 2 MG/ML IJ SOLN
0.5000 mg | INTRAMUSCULAR | Status: DC
  Administered 2015-01-22: 0.5 mg via INTRAVENOUS

## 2015-01-22 MED ORDER — TRAMADOL HCL 50 MG PO TABS
100.0000 mg | ORAL_TABLET | Freq: Four times a day (QID) | ORAL | Status: DC | PRN
Start: 2015-01-22 — End: 2015-01-23
  Administered 2015-01-22 (×2): 100 mg via ORAL
  Filled 2015-01-22 (×2): qty 2

## 2015-01-22 MED ORDER — HALOPERIDOL LACTATE 5 MG/ML IJ SOLN: 2.0000 mg | Freq: Once | INTRAMUSCULAR | Status: DC

## 2015-01-22 MED ORDER — LORAZEPAM 2 MG/ML IJ SOLN
0.5000 mg | INTRAMUSCULAR | Status: DC | PRN
Start: 2015-01-22 — End: 2015-01-23
  Administered 2015-01-23: 0.5 mg via INTRAVENOUS
  Filled 2015-01-22: qty 1

## 2015-01-22 NOTE — Progress Notes (Signed)
Patient awoke with increased agitation, wanting staff to get her  to back to her own room, difficult to  re orientation, Patient states that she is 81 years old does not need staff members telling her what to do." Patient assisted to bathroom, and ambulated 25 feet, in hallway. Patient wanting to go to the front office to report staff. Patient finally agreed to p.o. Ativan 1 mg. Paged N.P.regarding this situation.

## 2015-01-22 NOTE — Clinical Social Work Note (Signed)
Clinical Social Work Assessment  Patient Details  Name: Glenda Underwood MRN: [id number] Date of Birth: Aug 04, 1932  Date of referral:  01/22/15               Reason for consult:  Discharge Planning                Permission sought to share information with:  Family Supports Permission granted to share information::  No (pt oriented to person only. Assessment completed with pt daughter)  Name::     Ermalene Searing  Agency::     Relationship::  daughter  Contact Information:  [phone]  Housing/Transportation Living arrangements for the past 2 months:  [place] of Information:  Adult Children Patient Interpreter Needed:  None Criminal Activity/Legal Involvement Pertinent to Current Situation/Hospitalization:  No - Comment as needed Significant Relationships:  Adult Children Lives with:  Facility Resident Do you feel safe going back to the place where you live?  Yes Need for family participation in patient care:  Yes (Comment)  Care giving concerns:  Pt admitted from [place] [facility]. Pt daughter did not expressed that she is hopeful to take pt home to her home following hospitalization.    Social Worker assessment / plan:  CSW received referral that pt admitted from [facility].   CSW met with pt daughter, Colletta [place] alone in family room. CSW introduced self and explained role. Pt daughter confirmed that pt admitted from [facility]. Pt daughter stated that pt has been a resident at the facility for three years. Pt daughter discussed that her ultimate goal is for pt to move pt into her home and wanted to explore if pt could return home with pt daughter following hospitalization if arrangements could be made for hospital bed and home care. Pt daughter reports that she has been looking into resources in the community and has found a senior center in the area that pt daughter lives that can pt daughter can take pt to for socializing and games and respite for  pt daughter. Pt daughter reports that it was planned to move pt out of [facility] at the beginning of July, but then the plan changed to the beginning of August. Pt daughter states that her main concern is the hospital bed and if that can be arranged then she would prefer for pt to discharge directly home with her instead of having to go back to [facility] and then in a few weeks move in with pt daughter. CSW explained that RNCM can meet with pt daughter to discuss home health and equipment needs. Pt daughter agreeable and feels that moving in with pt daughter from hospital will be the easiest transition for the pt as pt has dementia.  CSW notified RNCM of pt daughter wishes for pt to return home with her if hospital bed and home health needs can be arranged. RNCM plan to meet with pt daughter.  CSW to continue to follow to provide support, but plan at this time is for discharge home with pt daughter and home health services.  Employment status:  Retired Forensic scientist:  Medicare PT Recommendations:    Information / Referral to community resources:  Other (Comment Required) (referral to [facility] for home care needs)  Patient/Family's Response to care:  Pt alert and oriented to person only. Pt pleasantly confused. Pt daughter supportive and actively involved in pt care and states that she is a Marine scientist, but no longer works and  can provide 24 hour care to pt at her home. Pt daughter has already explored options for transportation and adult day programs that pt could participate in and has been planning for pt to move into her home.  Patient/Family's Understanding of and Emotional Response to Diagnosis, Current Treatment, and Prognosis:  Pt daughter displayed understanding about pt diagnosis and treatment plan. Pt daughter coping appropriately with plans to transition pt to her home out of [place].   Emotional Assessment Appearance:  Appears stated  age Attitude/Demeanor/Rapport:  Other (pt appropriate, but oriented to person only) Affect (typically observed):  Other (pt pleasantly confused) Orientation:  Oriented to Self Alcohol / Substance use:  Not Applicable Psych involvement (Current and /or in the community):  No (Comment)  Discharge Needs  Concerns to be addressed:  Discharge Planning Concerns Readmission within the last 30 days:  No Current discharge risk:  None Barriers to Discharge:  No Barriers Identified   [place], [place], LCSW 01/22/2015, 12:00 PM  [phone]

## 2015-01-22 NOTE — Care Management Important Message (Signed)
Important Message  Patient Details  Name: ROSALIA MCAVOY MRN: [id number] Date of Birth: 04/14/1933   Medicare Important Message Given:  Yes-second notification given    Haskell Flirt 01/22/2015, 11:32 AMImportant Message  Patient Details  Name: POLETTE NOFSINGER MRN: [id number] Date of Birth: July 24, 1932   Medicare Important Message Given:  Yes-second notification given    Haskell Flirt 01/22/2015, 11:32 AM

## 2015-01-22 NOTE — Progress Notes (Signed)
[facility] PROGRESS NOTE  Glenda Underwood:[id number] DOB: 1933/02/21 DOA: 01/20/2015 PCP: Florentina Jenny, MD  Summary 01/21/15: I have seen and examined Glenda Underwood at bedside in the presence of her daughter Annice Pih and reviewed her chart. Glenda Underwood is a pleasant 81 year old female with multiple medical problems including diabetes mellitus type 2/at atrial fibrillation/essential hypertension/COPD/tobacco dependency. She came in from an assisted living facility after a fall and was found to have UTI with acute kidney injury resulting in acute metabolic encephalopathy. Concern for neck injury prompted CT of the cervical spine that showed "Multilevel osteoarthritic change. No acute fracture or Spondylolisthesis. Apparent nodular lesion right lobe of thyroid. Consider further evaluation with thyroid ultrasound. If patient is clinically hyperthyroid, consider nuclear medicine thyroid uptake and scan", and this prompted thyroid ultrasound that demonstrated "Findings suggestive of multi nodular goiter asymmetrically involving the right lobe of the thyroid. The dominant approximately 1.6 cm nodule with the mid aspect the right lobe of the thyroid as well as the approximately 1.5 cm nodule within the inferior aspect the right lobe of the thyroid both meet imaging criteria to recommend percutaneous sampling as clinically indicated". Her TSH is normal. In any case patient is on Levaquin/IV fluids and her renal function is improving. She is more with it today. Her daughter would like to eventually take her home. Daughter declined further workup for the abnormal thyroid function, understanding this could be malignant. Physical therapy evaluation is pending. Will continue current antibiotics, discontinue IV fluids, follow physical therapy recommendations and plan discharge home in the next 24-48 hours if patient continues to do well. 01/22/15: More with it. Occasionally confused. Plan to d/c home in am if no  significant changes Plan Metabolic encephalopathy/UTI (lower urinary tract infection)/COPD (chronic obstructive pulmonary disease)/Acute kidney injury  Continue Levaquin to complete 7 days of antibiotics  Nicotine patch Essential hypertension/Dementia/Atrial fibrillation/Diabetes  No acute changes  Continue current management Multiple thyroid nodules  No further workup per patient's choice Code Status: Full Code Family Communication: I spoke with patient's daughter Glenda Underwood at the bedside. Disposition Plan: Eventually DC home with home health services   Consultants:  None  Procedures:  None  Antibiotics:  Levaquin 01/20/2015>  HPI/Subjective: Feels better. No complaints. Objective: Filed Vitals:   01/22/15 1308  BP: 133/48  Pulse: 68  Temp: 98.6 F (37 C)  Resp: 16    Intake/Output Summary (Last 24 hours) at 01/22/15 2009 Last data filed at 01/22/15 1226  Gross per 24 hour  Intake    600 ml  Output    500 ml  Net    100 ml   Filed Weights   01/20/15 2100  Weight: 68.947 kg (152 lb)    Exam:   General:  Comfortable at rest.  Cardiovascular: S1-S2 normal. No murmurs. Pulse regular.  Respiratory: Good air entry bilaterally. No rhonchi or rales.  Abdomen: Soft and nontender. Normal bowel sounds. No organomegaly.  Musculoskeletal: No pedal edema   Neurological: Intact  Data Reviewed: Basic Metabolic Panel:  Recent Labs Lab 01/20/15 1820 01/21/15 0406 01/22/15 0348  NA 136 136 140  K 5.2* 4.7 4.3  CL 100* 104 108  CO2 23 24 24   GLUCOSE 108* 147* 138*  BUN 43* 35* 17  CREATININE 1.98* 1.57* 1.11*  CALCIUM 9.1 8.4* 9.0   Liver Function Tests: No results for input(s): AST, ALT, ALKPHOS, BILITOT, PROT, ALBUMIN in the last 168 hours. No results for input(s): LIPASE, AMYLASE in the last 168 hours. No results for input(s):  AMMONIA in the last 168 hours. CBC:  Recent Labs Lab 01/20/15 1820 01/21/15 0406 01/22/15 0348  WBC 10.5 8.7 8.2   HGB 12.7 11.8* 12.1  HCT 39.6 36.1 37.9  MCV 84.8 84.3 82.9  PLT 281 260 242   Cardiac Enzymes: No results for input(s): CKTOTAL, CKMB, CKMBINDEX, TROPONINI in the last 168 hours. BNP (last 3 results) No results for input(s): BNP in the last 8760 hours.  ProBNP (last 3 results) No results for input(s): PROBNP in the last 8760 hours.  CBG:  Recent Labs Lab 01/21/15 1659 01/21/15 2141 01/22/15 0804 01/22/15 1141 01/22/15 1644  GLUCAP 193* 113* 135* 136* 112*    No results found for this or any previous visit (from the past 240 hour(s)).   Studies: US Soft Tissue Head/neck  01/21/2015   CLINICAL DATA:  Thyroid nodule  EXAM: THYROID ULTRASOUND  TECHNIQUE: Ultrasound examination of the thyroid gland and adjacent soft tissues was performed.  COMPARISON:  Cervical spine CT - 01/19/2015  FINDINGS: Right thyroid lobe  Measurements: Borderline enlarged measuring 5.2 x 2.3 x 3.1 cm.  Right, superior - 0.4 cm - echogenic, shadowing, likely a dystrophic calcification.  *Right, mid - 1.6 x 1.9 x 1.5 cm - mixed echogenic, solid.  *Right, inferior - 1.5 x 1.3 x 1.5 cm - mixed echogenic, solid  Both of these code dominant right-sided thyroid nodules likely correlate with the abnormal lobular appearance of the right lobe of the thyroid seen on preceding noncontrast cervical spine CT.  Left thyroid lobe  Measurements: Normal in size measuring 3.1 x 1.3 x 1.2 cm  Left, superior - 0.6 x 0.3 x 0.5 cm - anechoic, cystic  Left, mid - 1.1 x 0.8 x 0.7 cm - echogenic, solid.  Left, inferior, anterior - 0.5 x 0.4 x 0.3 cm - anechoic, cystic  Isthmus  Thickness: Normal in size measuring 0.3 cm in diameter.  No discrete nodule or mass is identified within the thyroid isthmus.  Lymphadenopathy  None visualized.  IMPRESSION: Findings suggestive of multi nodular goiter asymmetrically involving the right lobe of the thyroid. The dominant approximately 1.6 cm nodule with the mid aspect the right lobe of the thyroid as  well as the approximately 1.5 cm nodule within the inferior aspect the right lobe of the thyroid both meet imaging criteria to recommend percutaneous sampling as clinically indicated. This recommendation follows the consensus statement: Management of Thyroid Nodules Detected at [place]: Society of Radiologists in Ultrasound Consensus Conference Statement. Radiology 2005; [id number].   Electronically Signed   By: Simonne Come M.D.   On: 01/21/2015 10:31    Scheduled Meds: . aspirin  81 mg Oral Daily  . cloNIDine  0.2 mg Oral BID  . diltiazem  120 mg Oral Daily  . DULoxetine  60 mg Oral Daily  . felodipine  10 mg Oral Daily  . fenofibrate  54 mg Oral Daily  . gabapentin  600 mg Oral BID  . haloperidol lactate  2 mg Intravenous Once  . heparin  5,000 Units Subcutaneous 3 times per day  . insulin aspart  0-15 Units Subcutaneous TID WC  . insulin aspart  0-5 Units Subcutaneous QHS  . [START ON 01/23/2015] levofloxacin  500 mg Oral Q48H  . memantine  28 mg Oral Daily  . nicotine  14 mg Transdermal Daily   Continuous Infusions:    Time spent: 15 minutes    Glenda Underwood  [facility] Pager [phone]. If 7PM-7AM, please contact night-coverage at [email or web address], password [facility]  01/22/2015, 8:09 PM  LOS: 2 days

## 2015-01-22 NOTE — Progress Notes (Signed)
Physical Therapy Treatment Patient Details Name: Glenda Underwood MRN: [id number] DOB: 05-25-1933 Today's Date: 01/22/2015    History of Present Illness 81 yo female admitted with UTI, acute kidney injury, fall. Hx of Afib, dementia, COPD, HTN. Pt is from ALF    PT Comments    Pt in bed on 2 lts O2 sats 100%.  Removed nasal cannula RA avg 96%.  Assisted pt OOB to amb to BR to void.  Assisted with amb in hallway using RW.    Follow Up Recommendations  Home health PT;Supervision/Assistance - 24 hour (daughter plans to help and provide approriate care needs.)     Equipment Recommendations  Hospital bed (daughter verbalized request )    Recommendations for Other Services       Precautions / Restrictions Precautions Precautions: Fall Restrictions Weight Bearing Restrictions: No    Mobility  Bed Mobility Overal bed mobility: Needs Assistance Bed Mobility: Supine to Sit     Supine to sit: Supervision     General bed mobility comments: increased time to complete task   Transfers Overall transfer level: Needs assistance Equipment used: Rolling walker (2 wheeled) Transfers: Sit to/from Stand Sit to Stand: Supervision;Min guard;Min assist         General transfer comment: 50% VC's for direction.  Assisted off bed and on/off toilet.  pt required VC's for safety with turns and to keep walker with her as she kept parking it to the side.    Ambulation/Gait Ambulation/Gait assistance: Min assist Ambulation Distance (Feet): 75 Feet Assistive device: Rolling walker (2 wheeled) Gait Pattern/deviations: Step-to pattern;Step-through pattern;Trunk flexed     General Gait Details: intermittent assist to stabilize.    Stairs            Wheelchair Mobility    Modified Rankin (Stroke Patients Only)       Balance                                    Cognition Arousal/Alertness: Awake/alert Behavior During Therapy: WFL for tasks assessed/performed Overall  Cognitive Status: History of cognitive impairments - at baseline                      Exercises      General Comments        Pertinent Vitals/Pain Pain Assessment: Faces Faces Pain Scale: Hurts a little bit Pain Location: aches and pains "all over' Pain Intervention(s): Monitored during session;Repositioned    Home Living                      Prior Function            PT Goals (current goals can now be found in the care plan section) Progress towards PT goals: Progressing toward goals    Frequency  Min 3X/week    PT Plan      Co-evaluation             End of Session Equipment Utilized During Treatment: Gait belt Activity Tolerance: Patient tolerated treatment well Patient left: in chair;with call bell/phone within reach;with chair alarm set     Time: 1105-1130 PT Time Calculation (min) (ACUTE ONLY): 25 min  Charges:  $Gait Training: 8-22 mins $Therapeutic Activity: 8-22 mins                    G Codes:  Rica Koyanagi  PTA [facility]  Acute  Rehab Pager      [phone]

## 2015-01-22 NOTE — Plan of Care (Signed)
Problem: Phase I Progression Outcomes Goal: OOB as tolerated unless otherwise ordered Outcome: Progressing Ambulated in hallway with NT down to room 1343 and back to nurses station. Did not use walker.

## 2015-01-23 LAB — GLUCOSE, CAPILLARY
Glucose-Capillary: 157 mg/dL — ABNORMAL HIGH (ref 65–99)
Glucose-Capillary: 126 mg/dL — ABNORMAL HIGH (ref 65–99)
Glucose-Capillary: 134 mg/dL — ABNORMAL HIGH (ref 65–99)

## 2015-01-23 MED ORDER — LORAZEPAM 0.5 MG PO TABS: 0.5000 mg | ORAL_TABLET | Freq: Two times a day (BID) | ORAL | Status: DC

## 2015-01-23 MED ORDER — TRAMADOL HCL 50 MG PO TABS: 100.0000 mg | ORAL_TABLET | Freq: Four times a day (QID) | ORAL | Status: DC | PRN

## 2015-01-23 MED ORDER — LEVOFLOXACIN 500 MG PO TABS: 500.0000 mg | ORAL_TABLET | Freq: Every day | ORAL | Status: DC

## 2015-01-23 NOTE — Discharge Summary (Signed)
Glenda Underwood, is a 81 y.o. female  DOB Jan 22, 1933  MRN [id number].  Admission date:  01/20/2015  Admitting Physician  Gery Pray, MD  Discharge Date:  01/23/2015   Primary MD  Florentina Jenny, MD  Recommendations for primary care physician for things to follow:  Please follow renal function and streamline BP/pain medications.   Admission Diagnosis   Dehydration [E86.0] UTI (lower urinary tract infection) [N39.0]   Discharge Diagnosis  Dehydration [E86.0] UTI (lower urinary tract infection) [N39.0]   Active Problems:   COPD (chronic obstructive pulmonary disease)   Essential hypertension   Dementia   Atrial fibrillation   Diabetes   Multiple thyroid nodules      Hospital Course  Glenda Underwood is a pleasant 81 year old female with multiple medical problems including diabetes mellitus type 2/at atrial fibrillation/essential hypertension/COPD/tobacco dependency. She came in from an assisted living facility after a fall and was found to have UTI with acute kidney injury resulting in acute metabolic encephalopathy. Concern for neck injury prompted CT of the cervical spine that showed "Multilevel osteoarthritic change. No acute fracture or spondylolisthesis. Apparent nodular lesion right lobe of thyroid. Consider further evaluation with thyroid ultrasound. If patient is clinically hyperthyroid, consider nuclear medicine thyroid uptake and scan", and this prompted thyroid ultrasound that demonstrated "Findings suggestive of multi nodular goiter asymmetrically involving the right lobe of the thyroid. The dominant approximately 1.6 cm nodule with the mid aspect the right lobe of the thyroid as well as the approximately 1.5 cm nodule within the inferior aspect the right lobe of the thyroid both meet imaging criteria to  recommend percutaneous sampling as clinically indicated". Her TSH is normal. In any case patient was placed on Levaquin/IV fluids and her renal function improved. Patient's daughter [name] declined further workup for the abnormal thyroid images, understanding this could be malignant. She has chosen to take patient home with home health services. Patient will complete 4 more days of Levaquin. She should have her medications streamlined. I will deferred these to her PCP.  Discharge Condition Stable.  Consults obtained  None  Follow UP With PCP in 1-2 weeks   Discharge Instructions  and  Discharge Medications  Discharge Instructions    Diet - low sodium heart healthy    Complete by:  As directed      Diet Carb Modified    Complete by:  As directed      Increase activity slowly    Complete by:  As directed             Medication List    STOP taking these medications        benzonatate 100 MG capsule  Commonly known as:  TESSALON     felodipine 10 MG 24 hr tablet  Commonly known as:  PLENDIL     guaiFENesin 100 MG/5ML liquid  Commonly known as:  ROBITUSSIN     valsartan 80 MG tablet  Commonly known as:  DIOVAN      TAKE these medications  acetaminophen 325 MG tablet  Commonly known as:  TYLENOL  Take 650 mg by mouth every 4 (four) hours as needed for pain.     albuterol (2.5 MG/3ML) 0.083% nebulizer solution  Commonly known as:  PROVENTIL  Take 3 mLs (2.5 mg total) by nebulization every 4 (four) hours as needed for wheezing or shortness of breath.     aspirin 81 MG chewable tablet  Chew 81 mg by mouth daily.     cholecalciferol 1000 UNITS tablet  Commonly known as:  VITAMIN D  Take 1,000 Units by mouth daily.     cloNIDine 0.2 MG tablet  Commonly known as:  CATAPRES  TAKE 1 TABLET BY MOUTH TWICE A DAY.     diltiazem 120 MG 24 hr capsule  Commonly known as:  CARDIZEM CD  Take 1 capsule (120 mg total) by mouth daily.     DULoxetine 60 MG capsule    Commonly known as:  CYMBALTA  Take 1 capsule (60 mg total) by mouth daily.     fenofibrate 48 MG tablet  Commonly known as:  TRICOR  Take 48 mg by mouth at bedtime.     furosemide 20 MG tablet  Commonly known as:  LASIX  Take 20 mg by mouth 2 (two) times daily as needed (Weight gain of 3lbs in one day). Takes 20 mg every day at noon. Pt may have an additional dose of 20 mg for weight gain of 3lbs or more in one day     gabapentin 600 MG tablet  Commonly known as:  NEURONTIN  Take 600 mg by mouth 4 (four) times daily.     insulin aspart 100 UNIT/ML injection  Commonly known as:  novoLOG  Inject 10 Units into the skin 3 (three) times daily before meals. Hold if BS is less than 100     levofloxacin 500 MG tablet  Commonly known as:  LEVAQUIN  Take 1 tablet (500 mg total) by mouth daily.     LORazepam 0.5 MG tablet  Commonly known as:  ATIVAN  Take 1 tablet (0.5 mg total) by mouth 2 (two) times daily.     metFORMIN 1000 MG tablet  Commonly known as:  GLUCOPHAGE  Take 1,000 mg by mouth 2 (two) times daily with a meal.     NAMENDA XR 28 MG Cp24 24 hr capsule  Generic drug:  memantine  Take 28 mg by mouth daily.     ondansetron 4 MG tablet  Commonly known as:  ZOFRAN  Take 4 mg by mouth every 6 (six) hours as needed for nausea or vomiting.     pantoprazole 40 MG tablet  Commonly known as:  PROTONIX  Take 1 tablet (40 mg total) by mouth daily.     potassium chloride 10 MEQ tablet  Commonly known as:  K-DUR,KLOR-CON  Take 1 tablet (10 mEq total) by mouth daily.     simvastatin 20 MG tablet  Commonly known as:  ZOCOR  Take 1 tablet (20 mg total) by mouth daily.     sodium chloride 0.65 % Soln nasal spray  Commonly known as:  OCEAN  Place 1 spray into both nostrils as needed for congestion.     traMADol 50 MG tablet  Commonly known as:  ULTRAM  Take 2 tablets (100 mg total) by mouth every 6 (six) hours as needed (pain).     traZODone 50 MG tablet  Commonly known as:   DESYREL  Take 50 mg by mouth at  bedtime.        Diet and Activity recommendation: See Discharge Instructions above  Major procedures and Radiology Reports - PLEASE review detailed and final reports for all details, in brief -    Dg Chest 2 View  01/20/2015   CLINICAL DATA:  Unwitnessed fall at the skilled nursing facility. Current smoker. Current history of hypertension and dementia.  EXAM: CHEST  2 VIEW  COMPARISON:  11/13/2014 and earlier.  FINDINGS: AP semi-erect and lateral images were obtained. Cardiac silhouette moderately enlarged, unchanged. Hilar and mediastinal contours otherwise unremarkable. Emphysematous changes in the upper lobes, unchanged. Lungs clear. Bronchovascular markings normal. Pulmonary vascularity normal. No visible pleural effusions. No pneumothorax. Degenerative changes involving the thoracic spine.  IMPRESSION: Stable cardiomegaly. COPD/emphysema. No acute cardiopulmonary disease.   Electronically Signed   By: Hulan Saas M.D.   On: 01/20/2015 18:39   Ct Cervical Spine Wo Contrast  01/19/2015   CLINICAL DATA:  Pain following fall  EXAM: CT CERVICAL SPINE WITHOUT CONTRAST  TECHNIQUE: Multidetector CT imaging of the cervical spine was performed without intravenous contrast. Multiplanar CT image reconstructions were also generated.  COMPARISON:  Cervical spine radiographs October 10, 2013  FINDINGS: There is no fracture or spondylolisthesis. Prevertebral soft tissues and predental space regions are normal. There are several cysts in the region of the odontoid and midline C2 vertebral body. No frank erosions seen in this area. There is moderate pannus surrounding the posterior aspect of the odontoid without significant craniocervical junction narrowing. There is moderately severe disc space narrowing at C6-7. There is also moderate narrowing at C3-4. There are multiple anterior osteophytes throughout the cervical and upper thoracic spine regions. There is facet hypertrophy  at most levels bilaterally. There is no disc extrusion or stenosis. There is exit foraminal narrowing at C4-5 on the left, C5-6 on the left, and C6-7 on the left, moderately severe. No frank disc extrusion or stenosis. Thyroid appears somewhat inhomogeneous with an apparent nodule in the right lobe of the thyroid measuring approximately 1.5 cm.  IMPRESSION: Multilevel osteoarthritic change. No acute fracture or spondylolisthesis.  Apparent nodular lesion right lobe of thyroid. Consider further evaluation with thyroid ultrasound. If patient is clinically hyperthyroid, consider nuclear medicine thyroid uptake and scan.   Electronically Signed   By: Bretta Bang III M.D.   On: 01/19/2015 11:11   US Soft Tissue Head/neck  01/21/2015   CLINICAL DATA:  Thyroid nodule  EXAM: THYROID ULTRASOUND  TECHNIQUE: Ultrasound examination of the thyroid gland and adjacent soft tissues was performed.  COMPARISON:  Cervical spine CT - 01/19/2015  FINDINGS: Right thyroid lobe  Measurements: Borderline enlarged measuring 5.2 x 2.3 x 3.1 cm.  Right, superior - 0.4 cm - echogenic, shadowing, likely a dystrophic calcification.  *Right, mid - 1.6 x 1.9 x 1.5 cm - mixed echogenic, solid.  *Right, inferior - 1.5 x 1.3 x 1.5 cm - mixed echogenic, solid  Both of these code dominant right-sided thyroid nodules likely correlate with the abnormal lobular appearance of the right lobe of the thyroid seen on preceding noncontrast cervical spine CT.  Left thyroid lobe  Measurements: Normal in size measuring 3.1 x 1.3 x 1.2 cm  Left, superior - 0.6 x 0.3 x 0.5 cm - anechoic, cystic  Left, mid - 1.1 x 0.8 x 0.7 cm - echogenic, solid.  Left, inferior, anterior - 0.5 x 0.4 x 0.3 cm - anechoic, cystic  Isthmus  Thickness: Normal in size measuring 0.3 cm in diameter.  No discrete  nodule or mass is identified within the thyroid isthmus.  Lymphadenopathy  None visualized.  IMPRESSION: Findings suggestive of multi nodular goiter asymmetrically involving  the right lobe of the thyroid. The dominant approximately 1.6 cm nodule with the mid aspect the right lobe of the thyroid as well as the approximately 1.5 cm nodule within the inferior aspect the right lobe of the thyroid both meet imaging criteria to recommend percutaneous sampling as clinically indicated. This recommendation follows the consensus statement: Management of Thyroid Nodules Detected at [place]: Society of Radiologists in Ultrasound Consensus Conference Statement. Radiology 2005; [id number].   Electronically Signed   By: Simonne Come M.D.   On: 01/21/2015 10:31   Dg Knee Complete 4 Views Left  01/19/2015   CLINICAL DATA:  Acute left knee pain after fall this morning. Initial encounter.  EXAM: LEFT KNEE - COMPLETE 4+ VIEW  COMPARISON:  None.  FINDINGS: There is no evidence of fracture, dislocation, or joint effusion. Severe narrowing of patellofemoral space is noted as well as medial joint space. Mild narrowing of lateral joint space is noted. Soft tissues are unremarkable.  IMPRESSION: Severe degenerative joint disease is noted. No acute abnormality seen in the left knee.   Electronically Signed   By: Lupita Raider, M.D.   On: 01/19/2015 11:03   Dg Hip Unilat With Pelvis 2-3 Views Left  01/19/2015   CLINICAL DATA:  Pain following fall  EXAM: DG HIP (WITH OR WITHOUT PELVIS) 2-3V LEFT  COMPARISON:  None.  FINDINGS: Frontal pelvis as well as frontal and lateral left hip images were obtained. There is mild narrowing of both hip joints. There is mild bony overgrowth along the lateral left acetabulum. There is no demonstrable fracture or dislocation. No erosive change.  IMPRESSION: Mild osteoarthritic change.  No fracture or dislocation.   Electronically Signed   By: Bretta Bang III M.D.   On: 01/19/2015 10:50    Micro Results   No results found for this or any previous visit (from the past 240 hour(s)).     Today   Subjective:   Glenda Underwood today has no headache,no chest abdominal  pain,no new weakness tingling or numbness, feels much better wants to go home today.   Objective:   Blood pressure 156/66, pulse 61, temperature 97.8 F (36.6 C), temperature source Oral, resp. rate 16, height 5' (1.524 m), weight 68.947 kg (152 lb), SpO2 98 %.   Intake/Output Summary (Last 24 hours) at 01/23/15 1042 Last data filed at 01/23/15 0900  Gross per 24 hour  Intake    720 ml  Output    300 ml  Net    420 ml    Exam Awake Alert, Oriented x 3, No new F.N deficits, Normal affect [place].AT,PERRAL Supple Neck,No JVD, No cervical lymphadenopathy appriciated.  Symmetrical Chest wall movement, Good air movement bilaterally, CTAB RRR,No Gallops,Rubs or new Murmurs, No Parasternal Heave +ve B.Sounds, Abd Soft, Non tender, No organomegaly appriciated, No rebound -guarding or rigidity. No Cyanosis, Clubbing or edema, No new Rash or bruise  Data Review   CBC w Diff: Lab Results  Component Value Date   WBC 8.2 01/22/2015   HGB 12.1 01/22/2015   HCT 37.9 01/22/2015   PLT 242 01/22/2015   LYMPHOPCT 6* 08/16/2013   MONOPCT 5 08/16/2013   EOSPCT 0 08/16/2013   BASOPCT 0 08/16/2013    CMP: Lab Results  Component Value Date   NA 140 01/22/2015   K 4.3 01/22/2015   CL 108 01/22/2015  CO2 24 01/22/2015   BUN 17 01/22/2015   CREATININE 1.11* 01/22/2015   PROT 7.6 09/23/2013   ALBUMIN 3.5 09/23/2013   BILITOT 0.6 09/23/2013   ALKPHOS 122* 09/23/2013   AST 34 09/23/2013   ALT 22 09/23/2013  .   Total Time in preparing paper work, data evaluation and todays exam - 35 minutes  Lashayla Armes M.D on 01/23/2015 at 10:42 AM  [facility] Office  [phone]

## 2015-01-23 NOTE — Progress Notes (Signed)
Pt for discharge home with pt daughter and home health services.  RNCM assisted with home care needs.  No further social work needs identified at this time.   CSW signing off.   Loletta Specter, MSW, LCSW Clinical Social Work [phone]

## 2015-01-23 NOTE — Progress Notes (Signed)
Physical Therapy Treatment Patient Details Name: Glenda Underwood MRN: [id number] DOB: [birth year] Today's Date: 01-29-2015    History of Present Illness 81 yo female admitted with UTI, acute kidney injury, fall. Hx of Afib, dementia, COPD, HTN. Pt is from ALF    PT Comments    Pt ambulated in hallway and currently requiring min/guard assist for safety.  Pt reports she will discharge home with daughter today.  Follow Up Recommendations  Home health PT;Supervision/Assistance - 24 hour (plans per chart for d/c home with daughter)     Equipment Recommendations  Hospital bed    Recommendations for Other Services       Precautions / Restrictions Precautions Precautions: Fall    Mobility  Bed Mobility               General bed mobility comments: sitting EOB upon arrival and exiting room with NT  Transfers Overall transfer level: Needs assistance Equipment used: Rolling walker (2 wheeled) Transfers: Sit to/from Stand Sit to Stand: Min guard         General transfer comment: verbal cues for safety, also attempted to leave RW off to side upon returning to bed  Ambulation/Gait Ambulation/Gait assistance: Min guard Ambulation Distance (Feet): 90 Feet Assistive device: Rolling walker (2 wheeled) Gait Pattern/deviations: Step-through pattern;Decreased stride length;Trunk flexed     General Gait Details: verbal cues for using RW, once pt fatigued she requested a chair   Stairs            Wheelchair Mobility    Modified Rankin (Stroke Patients Only)       Balance                                    Cognition Arousal/Alertness: Awake/alert Behavior During Therapy: WFL for tasks assessed/performed Overall Cognitive Status: History of cognitive impairments - at baseline                      Exercises      General Comments        Pertinent Vitals/Pain Pain Assessment: Faces Faces Pain Scale: No hurt    Home Living                       Prior Function            PT Goals (current goals can now be found in the care plan section) Progress towards PT goals: Progressing toward goals    Frequency  Min 3X/week    PT Plan Current plan remains appropriate    Co-evaluation             End of Session Equipment Utilized During Treatment: Gait belt Activity Tolerance: Patient tolerated treatment well Patient left: with call bell/phone within reach;in bed;with nursing/sitter in room     Time: 1035-1049 PT Time Calculation (min) (ACUTE ONLY): 14 min  Charges:  $Gait Training: 8-22 mins                    G Codes:      Evony Rezek,KATHrine E 29-Jan-2015, 11:39 AM Zenovia Jarred, PT, DPT 01/29/15 Pager: [phone]

## 2015-01-23 NOTE — Progress Notes (Signed)
Informed by Jorja Loa, RN with [name] that Dr. Redmond School is not pt's PCP or His NP. Spoke with pt's daughter Annice Pih to inform her of this information.  She states, pt will now go to Dr. Mauricio Po in [place] for PCP.

## 2015-01-23 NOTE — Progress Notes (Signed)
Spoke who gave permission to speak with her daughter Annice Pih concerning HH. Spoke with Annice Pih [phone] who selected [facility].  Also informed Annice Pih that pt will need a PCP. Annice Pih gave the name Dr. Mauricio Po in [place]. A call to [facility] [phone]. Informed HH that Dr. Mauricio Po will be PCP.

## 2015-01-23 NOTE — Progress Notes (Signed)
Discharge instructions and prescriptions given.  Heart failure packet also given before discharge.   Glenda Underwood

## 2015-01-24 DIAGNOSIS — G9341 Metabolic encephalopathy: Secondary | ICD-10-CM | POA: Diagnosis not present

## 2015-01-24 DIAGNOSIS — I4891 Unspecified atrial fibrillation: Secondary | ICD-10-CM | POA: Diagnosis not present

## 2015-01-24 DIAGNOSIS — I2510 Atherosclerotic heart disease of native coronary artery without angina pectoris: Secondary | ICD-10-CM | POA: Diagnosis not present

## 2015-01-24 DIAGNOSIS — F0390 Unspecified dementia without behavioral disturbance: Secondary | ICD-10-CM | POA: Diagnosis not present

## 2015-01-24 DIAGNOSIS — I10 Essential (primary) hypertension: Secondary | ICD-10-CM | POA: Diagnosis not present

## 2015-01-24 DIAGNOSIS — E119 Type 2 diabetes mellitus without complications: Secondary | ICD-10-CM | POA: Diagnosis not present

## 2015-01-27 DIAGNOSIS — R799 Abnormal finding of blood chemistry, unspecified: Secondary | ICD-10-CM | POA: Diagnosis not present

## 2015-01-27 DIAGNOSIS — E119 Type 2 diabetes mellitus without complications: Secondary | ICD-10-CM | POA: Diagnosis not present

## 2015-01-27 DIAGNOSIS — E784 Other hyperlipidemia: Secondary | ICD-10-CM | POA: Diagnosis not present

## 2015-01-27 DIAGNOSIS — M4807 Spinal stenosis, lumbosacral region: Secondary | ICD-10-CM | POA: Diagnosis not present

## 2015-01-27 DIAGNOSIS — I10 Essential (primary) hypertension: Secondary | ICD-10-CM | POA: Diagnosis not present

## 2015-01-28 DIAGNOSIS — E119 Type 2 diabetes mellitus without complications: Secondary | ICD-10-CM | POA: Diagnosis not present

## 2015-01-28 DIAGNOSIS — I4891 Unspecified atrial fibrillation: Secondary | ICD-10-CM | POA: Diagnosis not present

## 2015-01-28 DIAGNOSIS — I2510 Atherosclerotic heart disease of native coronary artery without angina pectoris: Secondary | ICD-10-CM | POA: Diagnosis not present

## 2015-01-28 DIAGNOSIS — G9341 Metabolic encephalopathy: Secondary | ICD-10-CM | POA: Diagnosis not present

## 2015-01-28 DIAGNOSIS — I10 Essential (primary) hypertension: Secondary | ICD-10-CM | POA: Diagnosis not present

## 2015-01-28 DIAGNOSIS — F0390 Unspecified dementia without behavioral disturbance: Secondary | ICD-10-CM | POA: Diagnosis not present

## 2015-01-29 DIAGNOSIS — I2510 Atherosclerotic heart disease of native coronary artery without angina pectoris: Secondary | ICD-10-CM | POA: Diagnosis not present

## 2015-01-29 DIAGNOSIS — E119 Type 2 diabetes mellitus without complications: Secondary | ICD-10-CM | POA: Diagnosis not present

## 2015-01-29 DIAGNOSIS — F0390 Unspecified dementia without behavioral disturbance: Secondary | ICD-10-CM | POA: Diagnosis not present

## 2015-01-29 DIAGNOSIS — I10 Essential (primary) hypertension: Secondary | ICD-10-CM | POA: Diagnosis not present

## 2015-01-29 DIAGNOSIS — G9341 Metabolic encephalopathy: Secondary | ICD-10-CM | POA: Diagnosis not present

## 2015-01-29 DIAGNOSIS — I4891 Unspecified atrial fibrillation: Secondary | ICD-10-CM | POA: Diagnosis not present

## 2015-01-30 DIAGNOSIS — E119 Type 2 diabetes mellitus without complications: Secondary | ICD-10-CM | POA: Diagnosis not present

## 2015-01-30 DIAGNOSIS — G9341 Metabolic encephalopathy: Secondary | ICD-10-CM | POA: Diagnosis not present

## 2015-01-30 DIAGNOSIS — I2510 Atherosclerotic heart disease of native coronary artery without angina pectoris: Secondary | ICD-10-CM | POA: Diagnosis not present

## 2015-01-30 DIAGNOSIS — I4891 Unspecified atrial fibrillation: Secondary | ICD-10-CM | POA: Diagnosis not present

## 2015-01-30 DIAGNOSIS — F0390 Unspecified dementia without behavioral disturbance: Secondary | ICD-10-CM | POA: Diagnosis not present

## 2015-01-30 DIAGNOSIS — I10 Essential (primary) hypertension: Secondary | ICD-10-CM | POA: Diagnosis not present

## 2015-02-02 DIAGNOSIS — I4891 Unspecified atrial fibrillation: Secondary | ICD-10-CM | POA: Diagnosis not present

## 2015-02-02 DIAGNOSIS — E119 Type 2 diabetes mellitus without complications: Secondary | ICD-10-CM | POA: Diagnosis not present

## 2015-02-02 DIAGNOSIS — I2510 Atherosclerotic heart disease of native coronary artery without angina pectoris: Secondary | ICD-10-CM | POA: Diagnosis not present

## 2015-02-02 DIAGNOSIS — I10 Essential (primary) hypertension: Secondary | ICD-10-CM | POA: Diagnosis not present

## 2015-02-02 DIAGNOSIS — F0390 Unspecified dementia without behavioral disturbance: Secondary | ICD-10-CM | POA: Diagnosis not present

## 2015-02-02 DIAGNOSIS — G9341 Metabolic encephalopathy: Secondary | ICD-10-CM | POA: Diagnosis not present

## 2015-02-03 DIAGNOSIS — M25562 Pain in left knee: Secondary | ICD-10-CM | POA: Diagnosis not present

## 2015-02-03 DIAGNOSIS — I2510 Atherosclerotic heart disease of native coronary artery without angina pectoris: Secondary | ICD-10-CM | POA: Diagnosis not present

## 2015-02-03 DIAGNOSIS — M4807 Spinal stenosis, lumbosacral region: Secondary | ICD-10-CM | POA: Diagnosis not present

## 2015-02-03 DIAGNOSIS — G9341 Metabolic encephalopathy: Secondary | ICD-10-CM | POA: Diagnosis not present

## 2015-02-03 DIAGNOSIS — E784 Other hyperlipidemia: Secondary | ICD-10-CM | POA: Diagnosis not present

## 2015-02-03 DIAGNOSIS — M25551 Pain in right hip: Secondary | ICD-10-CM | POA: Diagnosis not present

## 2015-02-03 DIAGNOSIS — I4891 Unspecified atrial fibrillation: Secondary | ICD-10-CM | POA: Diagnosis not present

## 2015-02-03 DIAGNOSIS — I10 Essential (primary) hypertension: Secondary | ICD-10-CM | POA: Diagnosis not present

## 2015-02-03 DIAGNOSIS — E119 Type 2 diabetes mellitus without complications: Secondary | ICD-10-CM | POA: Diagnosis not present

## 2015-02-03 DIAGNOSIS — F0390 Unspecified dementia without behavioral disturbance: Secondary | ICD-10-CM | POA: Diagnosis not present

## 2015-02-03 DIAGNOSIS — R8299 Other abnormal findings in urine: Secondary | ICD-10-CM | POA: Diagnosis not present

## 2015-02-06 DIAGNOSIS — E119 Type 2 diabetes mellitus without complications: Secondary | ICD-10-CM | POA: Diagnosis not present

## 2015-02-06 DIAGNOSIS — F0390 Unspecified dementia without behavioral disturbance: Secondary | ICD-10-CM | POA: Diagnosis not present

## 2015-02-06 DIAGNOSIS — I2510 Atherosclerotic heart disease of native coronary artery without angina pectoris: Secondary | ICD-10-CM | POA: Diagnosis not present

## 2015-02-06 DIAGNOSIS — I4891 Unspecified atrial fibrillation: Secondary | ICD-10-CM | POA: Diagnosis not present

## 2015-02-06 DIAGNOSIS — G9341 Metabolic encephalopathy: Secondary | ICD-10-CM | POA: Diagnosis not present

## 2015-02-06 DIAGNOSIS — I10 Essential (primary) hypertension: Secondary | ICD-10-CM | POA: Diagnosis not present

## 2015-02-09 DIAGNOSIS — M1611 Unilateral primary osteoarthritis, right hip: Secondary | ICD-10-CM | POA: Diagnosis not present

## 2015-02-09 DIAGNOSIS — M4316 Spondylolisthesis, lumbar region: Secondary | ICD-10-CM | POA: Diagnosis not present

## 2015-02-09 DIAGNOSIS — M5136 Other intervertebral disc degeneration, lumbar region: Secondary | ICD-10-CM | POA: Diagnosis not present

## 2015-02-09 DIAGNOSIS — M545 Low back pain: Secondary | ICD-10-CM | POA: Diagnosis not present

## 2015-02-10 DIAGNOSIS — I10 Essential (primary) hypertension: Secondary | ICD-10-CM | POA: Diagnosis not present

## 2015-02-10 DIAGNOSIS — E119 Type 2 diabetes mellitus without complications: Secondary | ICD-10-CM | POA: Diagnosis not present

## 2015-02-10 DIAGNOSIS — G9341 Metabolic encephalopathy: Secondary | ICD-10-CM | POA: Diagnosis not present

## 2015-02-10 DIAGNOSIS — F0390 Unspecified dementia without behavioral disturbance: Secondary | ICD-10-CM | POA: Diagnosis not present

## 2015-02-10 DIAGNOSIS — I4891 Unspecified atrial fibrillation: Secondary | ICD-10-CM | POA: Diagnosis not present

## 2015-02-10 DIAGNOSIS — I2510 Atherosclerotic heart disease of native coronary artery without angina pectoris: Secondary | ICD-10-CM | POA: Diagnosis not present

## 2015-02-11 DIAGNOSIS — E785 Hyperlipidemia, unspecified: Secondary | ICD-10-CM | POA: Diagnosis not present

## 2015-02-11 DIAGNOSIS — F0390 Unspecified dementia without behavioral disturbance: Secondary | ICD-10-CM | POA: Diagnosis not present

## 2015-02-11 DIAGNOSIS — G9341 Metabolic encephalopathy: Secondary | ICD-10-CM | POA: Diagnosis not present

## 2015-02-11 DIAGNOSIS — E119 Type 2 diabetes mellitus without complications: Secondary | ICD-10-CM | POA: Diagnosis not present

## 2015-02-11 DIAGNOSIS — I10 Essential (primary) hypertension: Secondary | ICD-10-CM | POA: Diagnosis not present

## 2015-02-11 DIAGNOSIS — I2510 Atherosclerotic heart disease of native coronary artery without angina pectoris: Secondary | ICD-10-CM | POA: Diagnosis not present

## 2015-02-11 DIAGNOSIS — I4891 Unspecified atrial fibrillation: Secondary | ICD-10-CM | POA: Diagnosis not present

## 2015-02-11 DIAGNOSIS — R6883 Chills (without fever): Secondary | ICD-10-CM | POA: Diagnosis not present

## 2015-02-17 DIAGNOSIS — E119 Type 2 diabetes mellitus without complications: Secondary | ICD-10-CM | POA: Diagnosis not present

## 2015-02-17 DIAGNOSIS — G9341 Metabolic encephalopathy: Secondary | ICD-10-CM | POA: Diagnosis not present

## 2015-02-17 DIAGNOSIS — I4891 Unspecified atrial fibrillation: Secondary | ICD-10-CM | POA: Diagnosis not present

## 2015-02-17 DIAGNOSIS — I2510 Atherosclerotic heart disease of native coronary artery without angina pectoris: Secondary | ICD-10-CM | POA: Diagnosis not present

## 2015-02-17 DIAGNOSIS — F0390 Unspecified dementia without behavioral disturbance: Secondary | ICD-10-CM | POA: Diagnosis not present

## 2015-02-17 DIAGNOSIS — I10 Essential (primary) hypertension: Secondary | ICD-10-CM | POA: Diagnosis not present

## 2015-02-18 ENCOUNTER — Encounter: Payer: Self-pay | Admitting: *Deleted

## 2015-02-18 ENCOUNTER — Telehealth: Payer: Self-pay | Admitting: *Deleted

## 2015-02-18 NOTE — Telephone Encounter (Signed)
Called and spoke with pts' daughter, Glenda Underwood, and updated pt's hx in chart.Glenda Underwood

## 2015-02-19 DIAGNOSIS — E119 Type 2 diabetes mellitus without complications: Secondary | ICD-10-CM | POA: Diagnosis not present

## 2015-02-19 DIAGNOSIS — I10 Essential (primary) hypertension: Secondary | ICD-10-CM | POA: Diagnosis not present

## 2015-02-19 DIAGNOSIS — I4891 Unspecified atrial fibrillation: Secondary | ICD-10-CM | POA: Diagnosis not present

## 2015-02-19 DIAGNOSIS — G9341 Metabolic encephalopathy: Secondary | ICD-10-CM | POA: Diagnosis not present

## 2015-02-19 DIAGNOSIS — F0390 Unspecified dementia without behavioral disturbance: Secondary | ICD-10-CM | POA: Diagnosis not present

## 2015-02-19 DIAGNOSIS — I2510 Atherosclerotic heart disease of native coronary artery without angina pectoris: Secondary | ICD-10-CM | POA: Diagnosis not present

## 2015-02-25 DIAGNOSIS — E119 Type 2 diabetes mellitus without complications: Secondary | ICD-10-CM | POA: Diagnosis not present

## 2015-02-25 DIAGNOSIS — R319 Hematuria, unspecified: Secondary | ICD-10-CM | POA: Diagnosis not present

## 2015-02-25 DIAGNOSIS — I2510 Atherosclerotic heart disease of native coronary artery without angina pectoris: Secondary | ICD-10-CM | POA: Diagnosis not present

## 2015-02-25 DIAGNOSIS — G9341 Metabolic encephalopathy: Secondary | ICD-10-CM | POA: Diagnosis not present

## 2015-02-25 DIAGNOSIS — I4891 Unspecified atrial fibrillation: Secondary | ICD-10-CM | POA: Diagnosis not present

## 2015-02-25 DIAGNOSIS — F0390 Unspecified dementia without behavioral disturbance: Secondary | ICD-10-CM | POA: Diagnosis not present

## 2015-02-25 DIAGNOSIS — I10 Essential (primary) hypertension: Secondary | ICD-10-CM | POA: Diagnosis not present

## 2015-02-26 ENCOUNTER — Ambulatory Visit: Payer: Medicare Other | Admitting: Cardiology

## 2015-03-04 ENCOUNTER — Encounter: Payer: Self-pay | Admitting: Cardiology

## 2015-03-04 DIAGNOSIS — I4891 Unspecified atrial fibrillation: Secondary | ICD-10-CM | POA: Diagnosis not present

## 2015-03-04 DIAGNOSIS — E119 Type 2 diabetes mellitus without complications: Secondary | ICD-10-CM | POA: Diagnosis not present

## 2015-03-04 DIAGNOSIS — G9341 Metabolic encephalopathy: Secondary | ICD-10-CM | POA: Diagnosis not present

## 2015-03-04 DIAGNOSIS — F0390 Unspecified dementia without behavioral disturbance: Secondary | ICD-10-CM | POA: Diagnosis not present

## 2015-03-04 DIAGNOSIS — I2510 Atherosclerotic heart disease of native coronary artery without angina pectoris: Secondary | ICD-10-CM | POA: Diagnosis not present

## 2015-03-04 DIAGNOSIS — I10 Essential (primary) hypertension: Secondary | ICD-10-CM | POA: Diagnosis not present

## 2015-03-16 DIAGNOSIS — F411 Generalized anxiety disorder: Secondary | ICD-10-CM | POA: Diagnosis not present

## 2015-03-16 DIAGNOSIS — M25562 Pain in left knee: Secondary | ICD-10-CM | POA: Diagnosis not present

## 2015-03-16 DIAGNOSIS — M25551 Pain in right hip: Secondary | ICD-10-CM | POA: Diagnosis not present

## 2015-03-16 DIAGNOSIS — M4807 Spinal stenosis, lumbosacral region: Secondary | ICD-10-CM | POA: Diagnosis not present

## 2015-03-16 DIAGNOSIS — Z79899 Other long term (current) drug therapy: Secondary | ICD-10-CM | POA: Diagnosis not present

## 2015-03-20 ENCOUNTER — Ambulatory Visit (INDEPENDENT_AMBULATORY_CARE_PROVIDER_SITE_OTHER): Payer: Medicare Other | Admitting: Family Medicine

## 2015-03-20 VITALS — BP 122/72 | HR 68 | Temp 98.4°F | Resp 17 | Ht 62.0 in | Wt 151.0 lb

## 2015-03-20 DIAGNOSIS — M4806 Spinal stenosis, lumbar region: Secondary | ICD-10-CM

## 2015-03-20 DIAGNOSIS — M48061 Spinal stenosis, lumbar region without neurogenic claudication: Secondary | ICD-10-CM

## 2015-03-20 DIAGNOSIS — E119 Type 2 diabetes mellitus without complications: Secondary | ICD-10-CM

## 2015-03-20 DIAGNOSIS — M25562 Pain in left knee: Secondary | ICD-10-CM | POA: Diagnosis not present

## 2015-03-20 DIAGNOSIS — M25561 Pain in right knee: Secondary | ICD-10-CM | POA: Diagnosis not present

## 2015-03-20 DIAGNOSIS — F411 Generalized anxiety disorder: Secondary | ICD-10-CM

## 2015-03-20 MED ORDER — HYDROXYZINE HCL 25 MG PO TABS: 25.0000 mg | ORAL_TABLET | Freq: Three times a day (TID) | ORAL | Status: DC | PRN

## 2015-03-20 MED ORDER — METFORMIN HCL 1000 MG PO TABS: 1000.0000 mg | ORAL_TABLET | Freq: Every day | ORAL | Status: DC

## 2015-03-20 MED ORDER — TRAMADOL HCL 50 MG PO TABS: 100.0000 mg | ORAL_TABLET | Freq: Four times a day (QID) | ORAL | Status: DC | PRN

## 2015-03-20 MED ORDER — METHYLPREDNISOLONE ACETATE 80 MG/ML IJ SUSP
40.0000 mg | Freq: Once | INTRAMUSCULAR | Status: AC
Start: 2015-03-20 — End: 2015-03-20
  Administered 2015-03-20: 40 mg via INTRAMUSCULAR

## 2015-03-20 NOTE — Patient Instructions (Signed)
You may want to reduce the Protonix to every other day. This is usually enough to control the reflux. When Protonix is taken every day, the patient's are at higher risk of getting pneumonia.  I changed the medication list to reflect what you're doing now. I will eliminated albuterol nebulizer, clonidine, Levaquin, Ativan, trazodone, and potassium from the medication list.

## 2015-03-20 NOTE — Progress Notes (Signed)
This is an 81 year old woman, retired, who lives with her daughter in [place]. She has multiple problems most of which are chronic and somewhat debilitating. She was in a nursing home until a few months ago at which time her daughter brought her home because she was having syncope and falls. Since being at home, patient has brightened up significantly. Daughter attributes this to discontinuing the Ativan and substituting hydroxyzine. She's also reduce the clonidine to once a day and this seems to be resulting in less dizziness. The daughter is a Engineer, civil (consulting).  Patient main problem is arthritis in her low back. She's been diagnosed with spinal stenosis and also has knee pain and hip pain. She is unable to sit or stand for very long before she starts having the pelvic pain. She's had a total knee replacement in the right knee, and the left knee is starting to become more more painful. She has crepitus in her shoulder joint on the left but this is not painful. She is able to use her hands without too much difficulty and there is no swelling there.  Patient also continues to smoke. Daughter asked if she should continue her Alzheimer's medicine or possibly add Aricept. We decided that because her mental status that improved some much by discontinuing Ativan, we would wait and see before changing any of her psychoactive medications.  Patient needs a refill on her tramadol and also needs an orthopedic referral to see if there's things we can do to improve her mobility. She no longer uses a walker because she actually gets around easier in the house without one. She does use a wheelchair here in the office to speed her movement.  Patient denies any acute chest pain or shortness of breath, no nausea vomiting or diarrhea, no new rash or weakness.  Objective: This elderly woman with poor dental hygiene and in no acute distress. She seen with her daughter today. BP 122/72 mmHg  Pulse 68  Temp(Src) 98.4 F (36.9 C)  (Oral)  Resp 17  Ht 5\' 2"  (1.575 m)  Wt 151 lb (68.493 kg)  BMI 27.61 kg/m2  SpO2 98% HEENT: As remarked above, patient has poor dental condition but she has no acute issues with her ears or throat. She has conjugate vision and no scleral icterus. Neck: Supple no adenopathy or thyromegaly, no bruit Chest: Clear Heart: Sounds are very distant and I am unable to appreciate any murmur. Abdomen: Soft and nontender Extremities: Mild ulnar deviation of both hands with no acute erythema or swelling suggestive of synovitis. Straight leg raising is negative. She has no edema. She has good pedal pulses. Patient has a well-healed total knee replacement scar over her right knee and her left knee does have full range of motion although there is some synovial thickening, there is no effusion. Skin: No rashes appreciated, no early bedsores either.  Assessment: 81 year old woman with multiple chronic problems involving multiple systems and reducing her mobility. Nevertheless she's managed very well with her daughter and continues to lead a productive and satisfying life. If we could control her pain in her legs better, she will be much happier. I'll try to get her into see the orthopedist for joint injections if indicated, and if asked the nurse to give her 40 mg of Depo-Medrol IM today to see if that will help somewhat with the ongoing arthritis in her knees. I've asked the daughter not give her any insulin unless her blood sugar gets over 200, to stop taking the clonidine  and potassium. Hopefully this will reduce the chance of falls and syncope. We will use the hydroxyzine instead of the Ativan because the Ativan causes such significant mental depression.  Plan: This chart was scribed in my presence and reviewed by me personally.    ICD-9-CM ICD-10-CM   1. Spinal stenosis of lumbar region 724.02 M48.06 traMADol (ULTRAM) 50 MG tablet     methylPREDNISolone acetate (DEPO-MEDROL) injection 40 mg  2. Arthralgia  of both knees 719.46 M25.561 traMADol (ULTRAM) 50 MG tablet    M25.562 AMB referral to orthopedics     methylPREDNISolone acetate (DEPO-MEDROL) injection 40 mg  3. Anxiety state 300.00 F41.1 hydrOXYzine (ATARAX/VISTARIL) 25 MG tablet  4. Type 2 diabetes mellitus, controlled 250.00 E11.9 metFORMIN (GLUCOPHAGE) 1000 MG tablet     Signed, Elvina Sidle, MD

## 2015-04-13 ENCOUNTER — Ambulatory Visit (INDEPENDENT_AMBULATORY_CARE_PROVIDER_SITE_OTHER): Payer: Medicare Other | Admitting: Family Medicine

## 2015-04-13 VITALS — BP 108/46 | HR 60 | Temp 99.0°F | Resp 16 | Ht 59.75 in | Wt 147.0 lb

## 2015-04-13 DIAGNOSIS — M25562 Pain in left knee: Secondary | ICD-10-CM | POA: Diagnosis not present

## 2015-04-13 DIAGNOSIS — M25561 Pain in right knee: Secondary | ICD-10-CM | POA: Diagnosis not present

## 2015-04-13 DIAGNOSIS — M4806 Spinal stenosis, lumbar region: Secondary | ICD-10-CM | POA: Diagnosis not present

## 2015-04-13 DIAGNOSIS — M48061 Spinal stenosis, lumbar region without neurogenic claudication: Secondary | ICD-10-CM

## 2015-04-13 MED ORDER — TRAMADOL HCL 50 MG PO TABS: 100.0000 mg | ORAL_TABLET | Freq: Four times a day (QID) | ORAL | Status: DC | PRN

## 2015-04-13 NOTE — Patient Instructions (Signed)
Stop the diltiazem and protonix and Tricor   Bring in all medications next visit.

## 2015-04-13 NOTE — Progress Notes (Signed)
   Subjective:    Patient ID: Glenda Underwood, female    DOB: 1933-01-22, 81 y.o.   MRN: [id number] This chart was scribed for Elvina Sidle, MD by Jolene Provost, Medical Scribe. This patient was seen in Room 4 and the patient's care was started a 4:15 PM.  Chief Complaint  Patient presents with  . Cough  . Medication Refill    Tramadol,     HPI HPI Comments: Glenda Underwood is a 81 y.o. female who presents to [facility] reporting for a medication refill. She takes tramadol 4 times per day, two  tablets. She states she has a mild persistent cough for the last several days. The pt smokes. No fever  Patient staying with her daughter who is a Engineer, civil (consulting). She's been monitoring medications and notes that the medications that the patient is taking do not line up with what is on medication list here on the computer. She's not taking TriCor and not taking protonix but she is taking valsartan.   Review of Systems  Constitutional: Negative for fever and chills.       No dramatic changes in health since last visit.  Respiratory: Positive for cough. Negative for shortness of breath.   Cardiovascular: Negative for palpitations and leg swelling.       Objective:   Physical Exam  Constitutional: She is oriented to person, place, and time. She appears well-developed and well-nourished. No distress.  HENT:  Head: Normocephalic and atraumatic.  Eyes: Pupils are equal, round, and reactive to light.  Neck: Neck supple.  Cardiovascular: Normal rate.   Pulmonary/Chest: Effort normal. No respiratory distress.  Musculoskeletal: Normal range of motion.  Neurological: She is alert and oriented to person, place, and time. Coordination normal.  Skin: Skin is warm and dry. She is not diaphoretic.  Psychiatric: She has a normal mood and affect. Her behavior is normal.  Nursing note and vitals reviewed.  Lungs are clear Heart is regular with no murmur or gallop Extremities show no edema Filed Vitals:   04/13/15  1542  BP: 108/46  Pulse: 60  Temp: 99 F (37.2 C)  TempSrc: Oral  Resp: 16  Height: 4' 11.75" (1.518 m)  Weight: 147 lb (66.679 kg)  SpO2: 98%       Assessment & Plan:    This chart was scribed in my presence and reviewed by me personally.    ICD-9-CM ICD-10-CM   1. Spinal stenosis of lumbar region 724.02 M48.06 traMADol (ULTRAM) 50 MG tablet  2. Arthralgia of both knees 719.46 M25.561 traMADol (ULTRAM) 50 MG tablet    M25.562      Signed, Elvina Sidle, MD   By signing my name below, I, Javier Docker, attest that this documentation has been prepared under the direction and in the presence of Elvina Sidle, MD. Electronically Signed: Javier Docker, ER Scribe. 04/13/2015. 4:15 PM.

## 2015-05-06 ENCOUNTER — Ambulatory Visit (INDEPENDENT_AMBULATORY_CARE_PROVIDER_SITE_OTHER): Payer: Medicare Other | Admitting: Family Medicine

## 2015-05-06 VITALS — BP 150/70 | HR 83 | Temp 98.6°F | Resp 16 | Ht 59.0 in | Wt 145.8 lb

## 2015-05-06 DIAGNOSIS — M48061 Spinal stenosis, lumbar region without neurogenic claudication: Secondary | ICD-10-CM

## 2015-05-06 DIAGNOSIS — G8929 Other chronic pain: Secondary | ICD-10-CM

## 2015-05-06 DIAGNOSIS — R404 Transient alteration of awareness: Secondary | ICD-10-CM

## 2015-05-06 DIAGNOSIS — R0602 Shortness of breath: Secondary | ICD-10-CM | POA: Diagnosis not present

## 2015-05-06 DIAGNOSIS — M545 Low back pain, unspecified: Secondary | ICD-10-CM

## 2015-05-06 DIAGNOSIS — M25562 Pain in left knee: Secondary | ICD-10-CM | POA: Diagnosis not present

## 2015-05-06 DIAGNOSIS — M4806 Spinal stenosis, lumbar region: Secondary | ICD-10-CM | POA: Diagnosis not present

## 2015-05-06 DIAGNOSIS — M25561 Pain in right knee: Secondary | ICD-10-CM | POA: Diagnosis not present

## 2015-05-06 LAB — POCT CBC
Granulocyte percent: 64.6 %G (ref 37–80)
HCT, POC: 43.7 % (ref 37.7–47.9)
Hemoglobin: 14.6 g/dL (ref 12.2–16.2)
Lymph, poc: 3.1 (ref 0.6–3.4)
MCH, POC: 27.9 pg (ref 27–31.2)
MCHC: 33.4 g/dL (ref 31.8–35.4)
MCV: 83.7 fL (ref 80–97)
MID (cbc): 1.1 — AB (ref 0–0.9)
MPV: 9.2 fL (ref 0–99.8)
POC Granulocyte: 7.6 — AB (ref 2–6.9)
POC LYMPH PERCENT: 26.5 %L (ref 10–50)
POC MID %: 8.9 %M (ref 0–12)
Platelet Count, POC: 244 10*3/uL (ref 142–424)
RBC: 5.22 M/uL (ref 4.04–5.48)
RDW, POC: 14 %
WBC: 11.8 10*3/uL — AB (ref 4.6–10.2)

## 2015-05-06 LAB — POCT URINALYSIS DIP (MANUAL ENTRY)
Bilirubin, UA: NEGATIVE
Blood, UA: NEGATIVE
Glucose, UA: 500 — AB
Ketones, POC UA: NEGATIVE
Nitrite, UA: NEGATIVE
Protein Ur, POC: 100 — AB
Spec Grav, UA: 1.02
Urobilinogen, UA: 0.2
pH, UA: 6.5

## 2015-05-06 LAB — POC MICROSCOPIC URINALYSIS (UMFC): Mucus: ABSENT

## 2015-05-06 LAB — GLUCOSE, POCT (MANUAL RESULT ENTRY): POC Glucose: 144 mg/dl — AB (ref 70–99)

## 2015-05-06 MED ORDER — TRAMADOL HCL 50 MG PO TABS: 100.0000 mg | ORAL_TABLET | Freq: Four times a day (QID) | ORAL | Status: DC | PRN

## 2015-05-06 MED ORDER — KETOROLAC TROMETHAMINE 60 MG/2ML IM SOLN
60.0000 mg | Freq: Once | INTRAMUSCULAR | Status: AC
  Administered 2015-05-06: 60 mg via INTRAMUSCULAR

## 2015-05-06 NOTE — Progress Notes (Signed)
This chart was scribed for Glenda Sidle, MD by Stann Ore, medical scribe at [facility].The patient was seen in exam room 11 and the patient's care was started at 2:02 PM.  Patient ID: Glenda Underwood MRN: [id number], DOB: February 11, 1933, 81 y.o. Date of Encounter: 05/06/2015  Primary Physician: Florentina Jenny, MD  Chief Complaint:  Chief Complaint  Patient presents with   Manic Behavior    According to daughter, goes through periods where she does not sleep   Altered Mental Status   Hyperglycemia   Mediation Refill    Tramadol   Other    Patient has not taken any medication this morning   Immunizations    Flu vaccine if ok. Pt daughter is worried mother has an infection.    HPI:  Glenda Underwood is a 81 y.o. female who presents to [facility] complaining of multiple concerns.  She has pain in her back but her medication ran out. She was having some AMS, according to her daughter. She had some shortness of breath and denies oxygen at home. She has some coughing before going to bed last night.   She is still a current smoker.  She was brought in by her daughter today.   Past Medical History  Diagnosis Date   CORONARY ARTERY DISEASE 07/20/2010    with stent   DEPRESSION 07/20/2010   DM (diabetes mellitus) type II controlled, neurological manifestation (HCC) 07/20/2010   GERD 07/20/2010   HYPERLIPIDEMIA 07/20/2010   HYPERTENSION 07/20/2010   LOW BACK PAIN 07/20/2010   OSTEOARTHRITIS 07/20/2010   URINARY INCONTINENCE 07/20/2010   TIA (transient ischemic attack)    CAD (coronary artery disease)     Stenting 2008 (no details)   Dementia    Hypokalemia    Spinal stenosis    Tobacco abuse    Varicose vein of leg      Home Meds: Prior to Admission medications   Medication Sig Start Date End Date Taking? Authorizing Provider  acetaminophen (TYLENOL) 325 MG tablet Take 650 mg by mouth every 4 (four) hours as needed for pain.    Yes Historical Provider, MD  aspirin 81 MG chewable tablet Chew 81 mg by mouth daily.   Yes Historical Provider, MD  cholecalciferol (VITAMIN D) 1000 UNITS tablet Take 1,000 Units by mouth daily.   Yes Historical Provider, MD  DULoxetine (CYMBALTA) 60 MG capsule Take 1 capsule (60 mg total) by mouth daily. 09/04/12  Yes Rollene Rotunda, MD  furosemide (LASIX) 20 MG tablet Take 20 mg by mouth 2 (two) times daily as needed (Weight gain of 3lbs in one day). Takes 20 mg every day at noon. Pt may have an additional dose of 20 mg for weight gain of 3lbs or more in one day   Yes Historical Provider, MD  gabapentin (NEURONTIN) 600 MG tablet Take 600 mg by mouth 4 (four) times daily.   Yes Historical Provider, MD  hydrOXYzine (ATARAX/VISTARIL) 25 MG tablet Take 1 tablet (25 mg total) by mouth 3 (three) times daily as needed. 03/20/15  Yes Glenda Sidle, MD  insulin aspart (NOVOLOG) 100 UNIT/ML injection Inject 10 Units into the skin 3 (three) times daily before meals. Hold if BS is less than 100   Yes Historical Provider, MD  Memantine HCl ER (NAMENDA XR) 28 MG CP24 Take 28 mg by mouth daily.   Yes Historical Provider, MD  metFORMIN (GLUCOPHAGE) 1000 MG tablet Take 1 tablet (1,000 mg total) by  mouth daily with breakfast. 03/20/15  Yes Glenda Sidle, MD  simvastatin (ZOCOR) 20 MG tablet Take 1 tablet (20 mg total) by mouth daily. Patient taking differently: Take 10 mg by mouth at bedtime.  09/04/12  Yes Rollene Rotunda, MD  traMADol (ULTRAM) 50 MG tablet Take 2 tablets (100 mg total) by mouth every 6 (six) hours as needed (pain). 04/13/15  Yes Glenda Sidle, MD  traZODone (DESYREL) 50 MG tablet Take 50 mg by mouth at bedtime.   Yes Historical Provider, MD  sodium chloride (OCEAN) 0.65 % SOLN nasal spray Place 1 spray into both nostrils as needed for congestion. Patient not taking: Reported on 05/06/2015 11/13/14   Roxy Horseman, PA-C    Allergies:  Allergies  Allergen Reactions   Penicillins Anaphylaxis      Social History   Social History   Marital Status: Widowed    Spouse Name: N/A   Number of Children: 5   Years of Education: N/A   Occupational History   Not on file.   Social History Main Topics   Smoking status: Current Every Day Smoker -- 0.50 packs/day for 15 years    Types: Cigarettes   Smokeless tobacco: Never Used     Comment: smokes 2-3 per day   Alcohol Use: No   Drug Use: No   Sexual Activity: Not Currently   Other Topics Concern   Not on file   Social History Narrative   She lives in [facility] and ambulates with walker.    She has two dogs.       Review of Systems: Constitutional: negative for fever, chills, night sweats, weight changes; positive for fatigue  HEENT: negative for vision changes, hearing loss, congestion, rhinorrhea, ST, epistaxis, or sinus pressure Cardiovascular: negative for chest pain or palpitations Respiratory: negative for hemoptysis, wheezing; positive for shortness of breath, cough Abdominal: negative for abdominal pain, nausea, vomiting, diarrhea, or constipation Dermatological: negative for rash Neurologic: negative for headache, dizziness, or syncope Musc: positive for back pain All other systems reviewed and are otherwise negative with the exception to those above and in the HPI.  Physical Exam: Blood pressure 150/70, pulse 83, temperature 98.6 F (37 C), temperature source Oral, resp. rate 16, height  (1.499 m), weight 145 lb 12.8 oz (66.134 kg), SpO2 97 %., Body mass index is 29.43 kg/(m^2). General: Well developed, well nourished, in no acute distress. Head: Normocephalic, atraumatic, eyes without discharge, sclera non-icteric, nares are without discharge. Bilateral auditory canals clear, TM's are without perforation, pearly grey and translucent with reflective cone of light bilaterally. Oral cavity moist, posterior pharynx without exudate, erythema, peritonsillar abscess, or post nasal drip.  Neck: Supple.  No thyromegaly. Full ROM. No lymphadenopathy. Lungs: Breathing is unlabored; expiratory wheezes bilaterally Heart: RRR with S1 S2. No murmurs, rubs, or gallops appreciated. Abdomen: Soft, non-tender, non-distended with normoactive bowel sounds. No hepatomegaly. No rebound/guarding. No obvious abdominal masses. Msk:  Strength and tone normal for age. Extremities/Skin: Warm and dry. No clubbing or cyanosis. No edema. No rashes or suspicious lesions. Neuro: Alert and oriented X 3. Moves all extremities spontaneously. Gait is normal. CNII-XII grossly in tact. Psych:  Responds to questions appropriately with a normal affect.   Labs: Results for orders placed or performed in visit on 05/06/15  POCT CBC  Result Value Ref Range   WBC 11.8 (A) 4.6 - 10.2 K/uL   Lymph, poc 3.1 0.6 - 3.4   POC LYMPH PERCENT 26.5 10 - 50 %L   MID (  cbc) 1.1 (A) 0 - 0.9   POC MID % 8.9 0 - 12 %M   POC Granulocyte 7.6 (A) 2 - 6.9   Granulocyte percent 64.6 37 - 80 %G   RBC 5.22 4.04 - 5.48 M/uL   Hemoglobin 14.6 12.2 - 16.2 g/dL   HCT, POC 16.143.7 09.637.7 - 47.9 %   MCV 83.7 80 - 97 fL   MCH, POC 27.9 27 - 31.2 pg   MCHC 33.4 31.8 - 35.4 g/dL   RDW, POC 04.514.0 %   Platelet Count, POC 244 142 - 424 K/uL   MPV 9.2 0 - 99.8 fL  POCT urinalysis dipstick  Result Value Ref Range   Color, UA yellow yellow   Clarity, UA clear clear   Glucose, UA =500 (A) negative   Bilirubin, UA negative negative   Ketones, POC UA negative negative   Spec Grav, UA 1.020    Blood, UA negative negative   pH, UA 6.5    Protein Ur, POC =100 (A) negative   Urobilinogen, UA 0.2    Nitrite, UA Negative Negative   Leukocytes, UA Trace (A) Negative  POCT Microscopic Urinalysis ([facility])  Result Value Ref Range   WBC,UR,HPF,POC None None WBC/hpf   RBC,UR,HPF,POC None None RBC/hpf   Bacteria None None, Too numerous to count   Mucus Absent Absent   Epithelial Cells, UR Per Microscopy Few (A) None, Too numerous to count cells/hpf  POCT glucose  (manual entry)  Result Value Ref Range   POC Glucose 144 (A) 70 - 99 mg/dl     ASSESSMENT AND PLAN:  81 y.o. year old female with chronic pain, probably overdoing the medication This chart was scribed in my presence and reviewed by me personally.    ICD-9-CM ICD-10-CM   1. Transient alteration of awareness 780.02 R40.4 POCT CBC     COMPLETE METABOLIC PANEL WITH GFR     POCT urinalysis dipstick     POCT Microscopic Urinalysis ([facility])     POCT glucose (manual entry)  2. Shortness of breath 786.05 R06.02   3. Chronic low back pain 724.2 M54.5 ketorolac (TORADOL) injection 60 mg   338.29 G89.29   4. Spinal stenosis of lumbar region 724.02 M48.06 traMADol (ULTRAM) 50 MG tablet     DISCONTINUED: traMADol (ULTRAM) 50 MG tablet  5. Arthralgia of both knees 719.46 M25.561 traMADol (ULTRAM) 50 MG tablet    M25.562 DISCONTINUED: traMADol (ULTRAM) 50 MG tablet    By signing my name below, I, Stann Oresung-Kai Tsai, attest that this documentation has been prepared under the direction and in the presence of Glenda SidleKurt Lauenstein, MD. Electronically Signed: Stann Oresung-Kai Tsai, Scribe. 05/06/2015 , 2:02 PM .  Signed, Glenda SidleKurt Lauenstein, MD 05/06/2015 2:02 PM

## 2015-05-07 LAB — COMPLETE METABOLIC PANEL WITH GFR
ALT: 17 U/L (ref 6–29)
AST: 19 U/L (ref 10–35)
Albumin: 4.2 g/dL (ref 3.6–5.1)
Alkaline Phosphatase: 116 U/L (ref 33–130)
BUN: 18 mg/dL (ref 7–25)
CO2: 22 mmol/L (ref 20–31)
Calcium: 9.7 mg/dL (ref 8.6–10.4)
Chloride: 102 mmol/L (ref 98–110)
Creat: 1.01 mg/dL — ABNORMAL HIGH (ref 0.60–0.88)
GFR, Est African American: 61 mL/min (ref >=60)
GFR, Est Non African American: 53 mL/min — ABNORMAL LOW (ref >=60)
Glucose, Bld: 173 mg/dL — ABNORMAL HIGH (ref 65–99)
Potassium: 3.9 mmol/L (ref 3.5–5.3)
Sodium: 137 mmol/L (ref 135–146)
Total Bilirubin: 0.5 mg/dL (ref 0.2–1.2)
Total Protein: 7.2 g/dL (ref 6.1–8.1)

## 2015-06-17 ENCOUNTER — Other Ambulatory Visit: Payer: Self-pay | Admitting: Family Medicine

## 2015-06-19 NOTE — Telephone Encounter (Signed)
Faxed

## 2015-06-20 DIAGNOSIS — R410 Disorientation, unspecified: Secondary | ICD-10-CM | POA: Diagnosis not present

## 2015-06-20 DIAGNOSIS — M545 Low back pain: Secondary | ICD-10-CM | POA: Diagnosis not present

## 2015-06-20 DIAGNOSIS — M5136 Other intervertebral disc degeneration, lumbar region: Secondary | ICD-10-CM | POA: Diagnosis not present

## 2015-06-20 DIAGNOSIS — F0390 Unspecified dementia without behavioral disturbance: Secondary | ICD-10-CM | POA: Diagnosis not present

## 2015-06-20 DIAGNOSIS — R4182 Altered mental status, unspecified: Secondary | ICD-10-CM | POA: Diagnosis not present

## 2015-07-09 ENCOUNTER — Other Ambulatory Visit: Payer: Self-pay | Admitting: Family Medicine

## 2015-07-09 NOTE — Telephone Encounter (Signed)
Meds ordered this encounter  Medications  . traMADol (ULTRAM) 50 MG tablet    Sig: TAKE TWO TABLETS BY MOUTH EVERY 6 HOURS AS NEEDED    Dispense:  120 tablet    Refill:  0

## 2015-07-10 NOTE — Telephone Encounter (Signed)
Faxed

## 2015-07-14 DIAGNOSIS — G8929 Other chronic pain: Secondary | ICD-10-CM | POA: Diagnosis not present

## 2015-07-14 DIAGNOSIS — Z8739 Personal history of other diseases of the musculoskeletal system and connective tissue: Secondary | ICD-10-CM | POA: Diagnosis not present

## 2015-07-14 DIAGNOSIS — M545 Low back pain: Secondary | ICD-10-CM | POA: Diagnosis not present

## 2015-07-14 DIAGNOSIS — E042 Nontoxic multinodular goiter: Secondary | ICD-10-CM | POA: Diagnosis not present

## 2015-07-14 DIAGNOSIS — E119 Type 2 diabetes mellitus without complications: Secondary | ICD-10-CM | POA: Diagnosis not present

## 2015-08-31 DIAGNOSIS — R413 Other amnesia: Secondary | ICD-10-CM | POA: Diagnosis not present

## 2015-09-10 DIAGNOSIS — M1712 Unilateral primary osteoarthritis, left knee: Secondary | ICD-10-CM | POA: Diagnosis not present

## 2015-09-25 DIAGNOSIS — R413 Other amnesia: Secondary | ICD-10-CM | POA: Diagnosis not present

## 2015-09-30 DIAGNOSIS — M79601 Pain in right arm: Secondary | ICD-10-CM | POA: Diagnosis not present

## 2015-10-06 DIAGNOSIS — Z751 Person awaiting admission to adequate facility elsewhere: Secondary | ICD-10-CM | POA: Diagnosis not present

## 2015-10-06 DIAGNOSIS — Z7984 Long term (current) use of oral hypoglycemic drugs: Secondary | ICD-10-CM | POA: Diagnosis not present

## 2015-10-06 DIAGNOSIS — E119 Type 2 diabetes mellitus without complications: Secondary | ICD-10-CM | POA: Diagnosis not present

## 2015-10-06 DIAGNOSIS — S41109A Unspecified open wound of unspecified upper arm, initial encounter: Secondary | ICD-10-CM | POA: Diagnosis not present

## 2015-10-06 DIAGNOSIS — R45851 Suicidal ideations: Secondary | ICD-10-CM | POA: Diagnosis not present

## 2015-10-06 DIAGNOSIS — F17200 Nicotine dependence, unspecified, uncomplicated: Secondary | ICD-10-CM | POA: Diagnosis not present

## 2015-10-06 DIAGNOSIS — T148 Other injury of unspecified body region: Secondary | ICD-10-CM | POA: Diagnosis not present

## 2015-10-06 DIAGNOSIS — Z79899 Other long term (current) drug therapy: Secondary | ICD-10-CM | POA: Diagnosis not present

## 2015-10-06 DIAGNOSIS — F0390 Unspecified dementia without behavioral disturbance: Secondary | ICD-10-CM | POA: Diagnosis not present

## 2015-10-06 DIAGNOSIS — I10 Essential (primary) hypertension: Secondary | ICD-10-CM | POA: Diagnosis not present

## 2015-10-06 DIAGNOSIS — R4182 Altered mental status, unspecified: Secondary | ICD-10-CM | POA: Diagnosis not present

## 2015-10-06 DIAGNOSIS — F29 Unspecified psychosis not due to a substance or known physiological condition: Secondary | ICD-10-CM | POA: Diagnosis not present

## 2015-10-06 DIAGNOSIS — Z794 Long term (current) use of insulin: Secondary | ICD-10-CM | POA: Diagnosis not present

## 2015-10-19 DIAGNOSIS — M545 Low back pain: Secondary | ICD-10-CM | POA: Diagnosis not present

## 2015-10-19 DIAGNOSIS — G8929 Other chronic pain: Secondary | ICD-10-CM | POA: Diagnosis not present

## 2015-10-19 DIAGNOSIS — Z8739 Personal history of other diseases of the musculoskeletal system and connective tissue: Secondary | ICD-10-CM | POA: Diagnosis not present

## 2015-10-19 DIAGNOSIS — I10 Essential (primary) hypertension: Secondary | ICD-10-CM | POA: Diagnosis not present

## 2015-10-19 DIAGNOSIS — E119 Type 2 diabetes mellitus without complications: Secondary | ICD-10-CM | POA: Diagnosis not present

## 2015-11-24 DIAGNOSIS — M0680 Other specified rheumatoid arthritis, unspecified site: Secondary | ICD-10-CM | POA: Diagnosis not present

## 2015-11-24 DIAGNOSIS — Z760 Encounter for issue of repeat prescription: Secondary | ICD-10-CM | POA: Diagnosis not present

## 2015-11-30 IMAGING — CT CT CERVICAL SPINE W/O CM
3 series · 15 of 27 positions shown, 18 images · non-contrast
Comparison: Cervical spine radiographs October 10, 2013

CLINICAL DATA: Pain following fall

EXAM:
CT CERVICAL SPINE WITHOUT CONTRAST
TECHNIQUE: Multidetector CT imaging of the cervical spine was performed without
intravenous contrast. Multiplanar CT image reconstructions were also
generated.

[Series 3: c-spine st · axial · 0.28mm/px · z∈[-162,-64]mm · 5 of 75 slices shown, 7 images]
[im 13/75  soft-tissue]
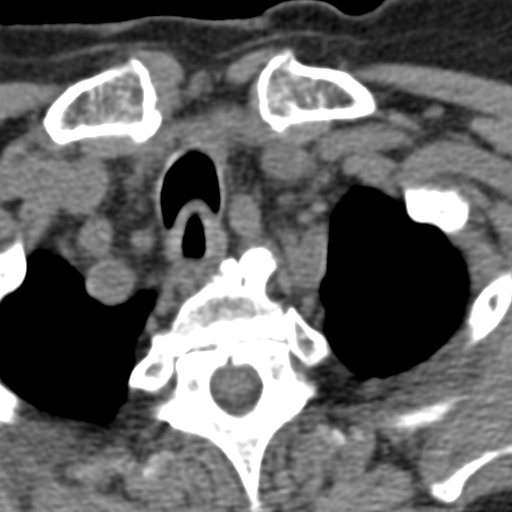
[im 13/75  bone]
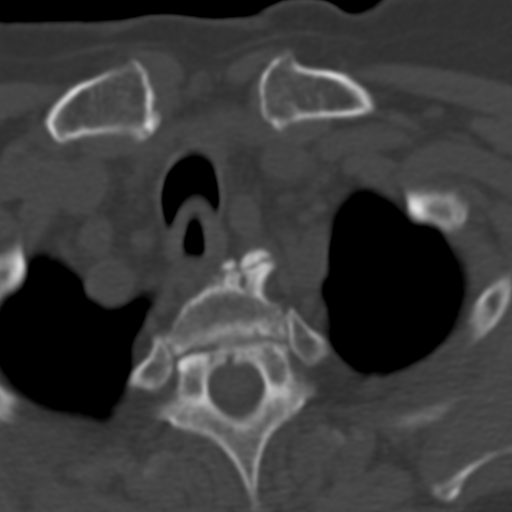
[im 25/75  bone]
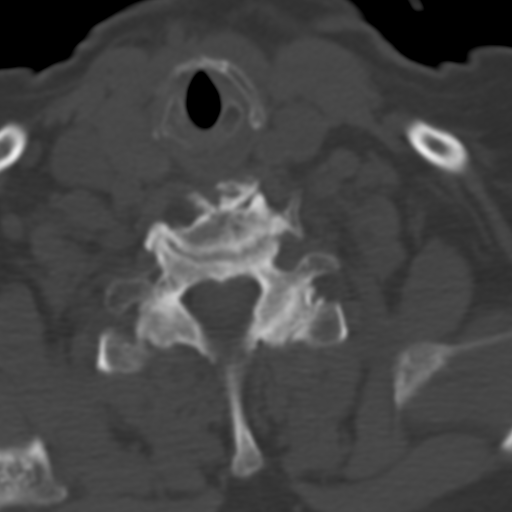
[im 38/75  bone]
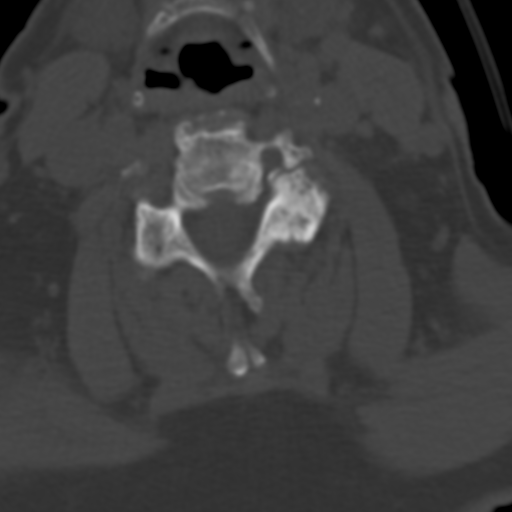
[im 50/75  bone]
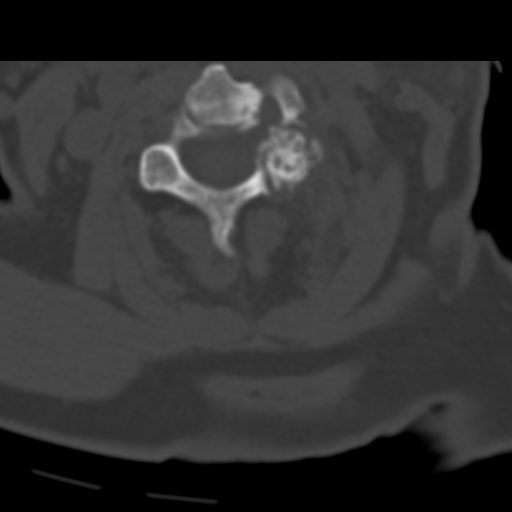
[im 62/75  soft-tissue]
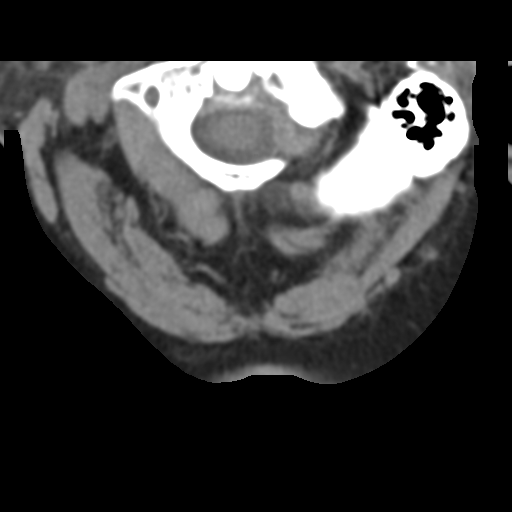
[im 62/75  bone]
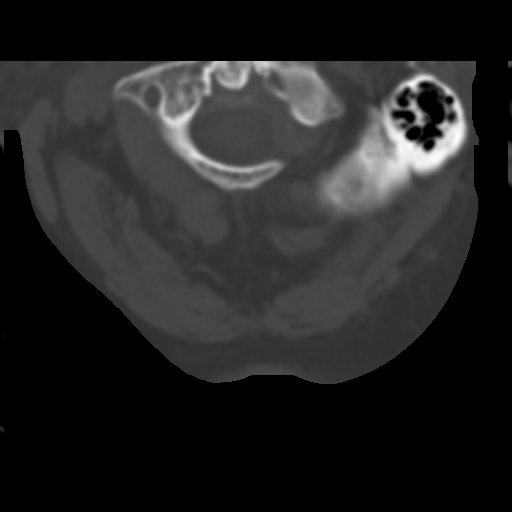

[Series 603: <mpr thick range(1)> · axial · 0.29mm/px · z∈[-195,-99]mm · 5 of 85 slices shown]
[im 15/85  bone]
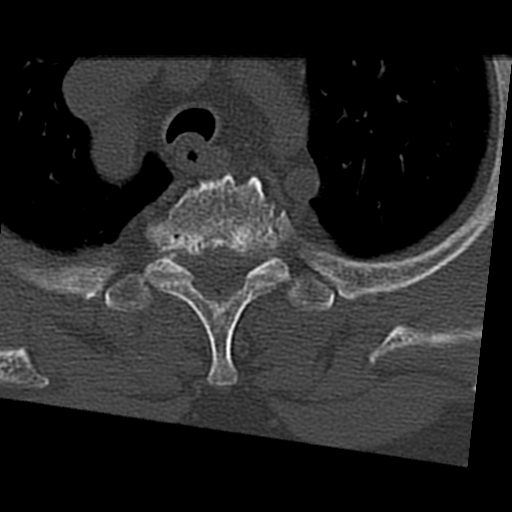
[im 29/85  bone]
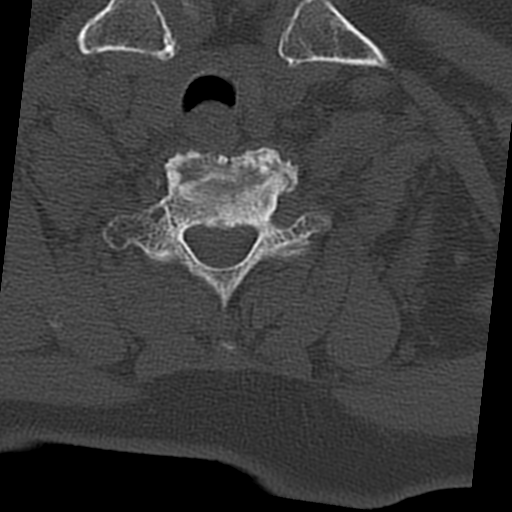
[im 43/85  bone]
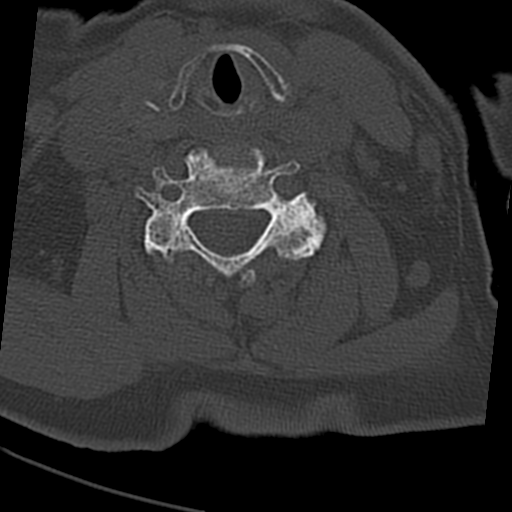
[im 57/85  bone]
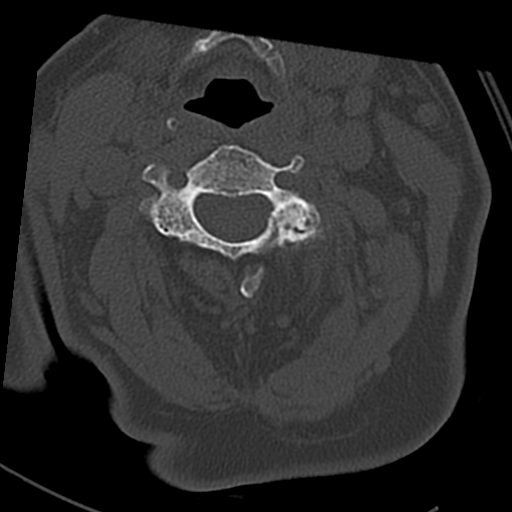
[im 71/85  bone]
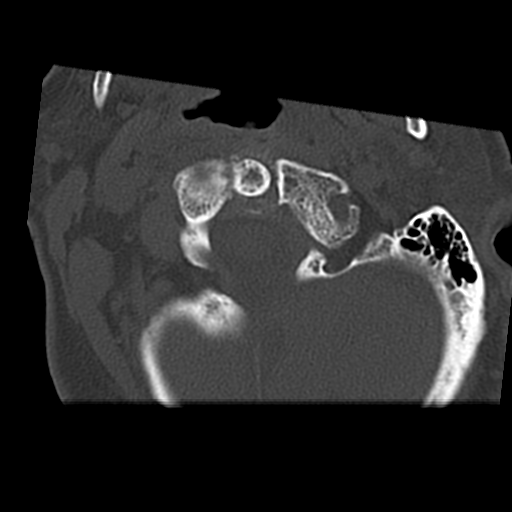

[Series 604: <mpr thick range(2)> · sagittal · 0.29mm/px · 5 of 46 slices shown, 6 images]
[im 16/46  bone]
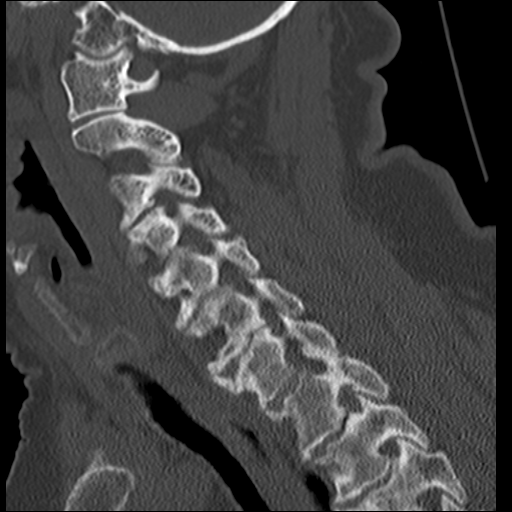
[im 19/46  bone]
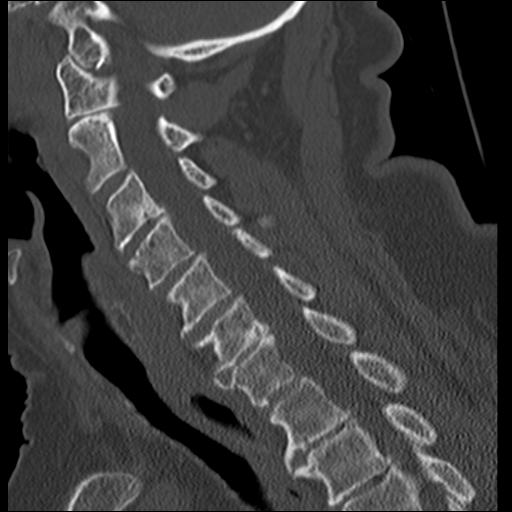
[im 23/46  soft-tissue]
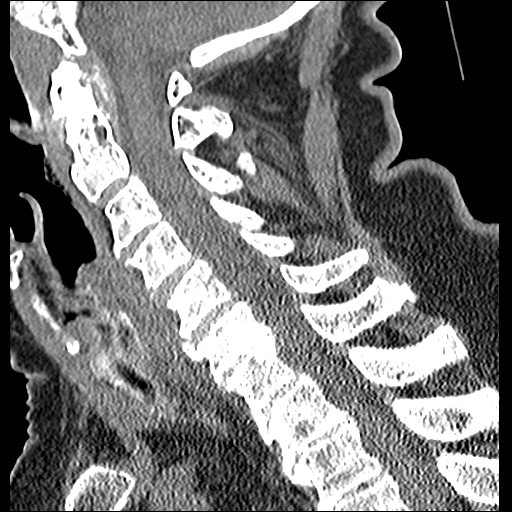
[im 23/46  bone]
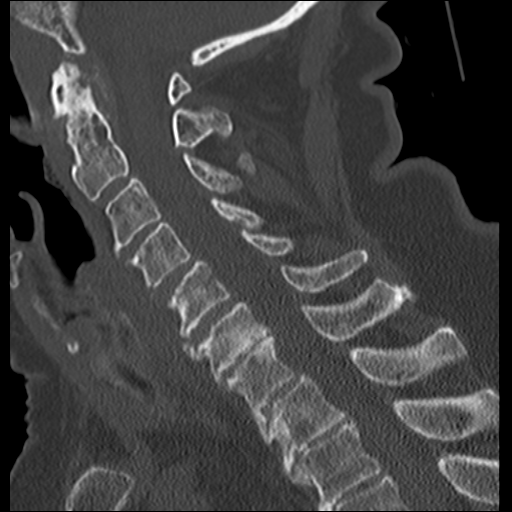
[im 27/46  bone]
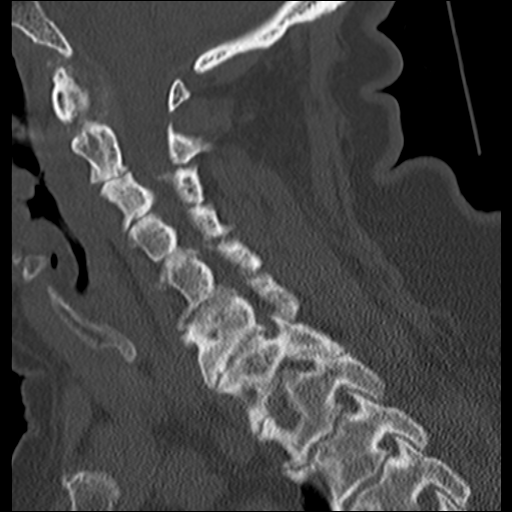
[im 31/46  bone]
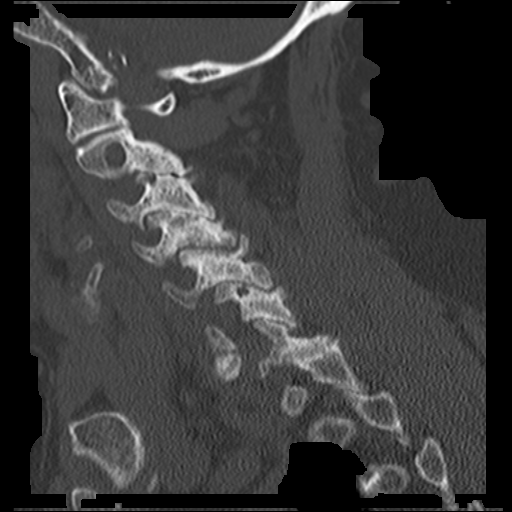

[15 of 27 positions shown; findings below may reference images not displayed]

FINDINGS: There is no fracture or spondylolisthesis. Prevertebral soft tissues
and predental space regions are normal. There are several cysts in
the region of the odontoid and midline C2 vertebral body. No frank
erosions seen in this area. There is moderate pannus surrounding the
posterior aspect of the odontoid without significant craniocervical
junction narrowing. There is moderately severe disc space narrowing
at C6-7. There is also moderate narrowing at C3-4. There are
multiple anterior osteophytes throughout the cervical and upper
thoracic spine regions. There is facet hypertrophy at most levels
bilaterally. There is no disc extrusion or stenosis. There is exit
foraminal narrowing at C4-5 on the left, C5-6 on the left, and C6-7
on the left, moderately severe. No frank disc extrusion or stenosis.
Thyroid appears somewhat inhomogeneous with an apparent nodule in
the right lobe of the thyroid measuring approximately 1.5 cm.
IMPRESSION: Multilevel osteoarthritic change. No acute fracture or
spondylolisthesis.

Apparent nodular lesion right lobe of thyroid. Consider further
evaluation with thyroid ultrasound. If patient is clinically
hyperthyroid, consider nuclear medicine thyroid uptake and scan.

## 2015-12-01 IMAGING — CR DG CHEST 2V
2 series · 2 of 2 positions shown · non-contrast
Comparison: 11/13/2014 and earlier.

CLINICAL DATA: Unwitnessed fall at the [REDACTED].
Current smoker. Current history of hypertension and dementia.

EXAM:
CHEST  2 VIEW

[w chest lat]
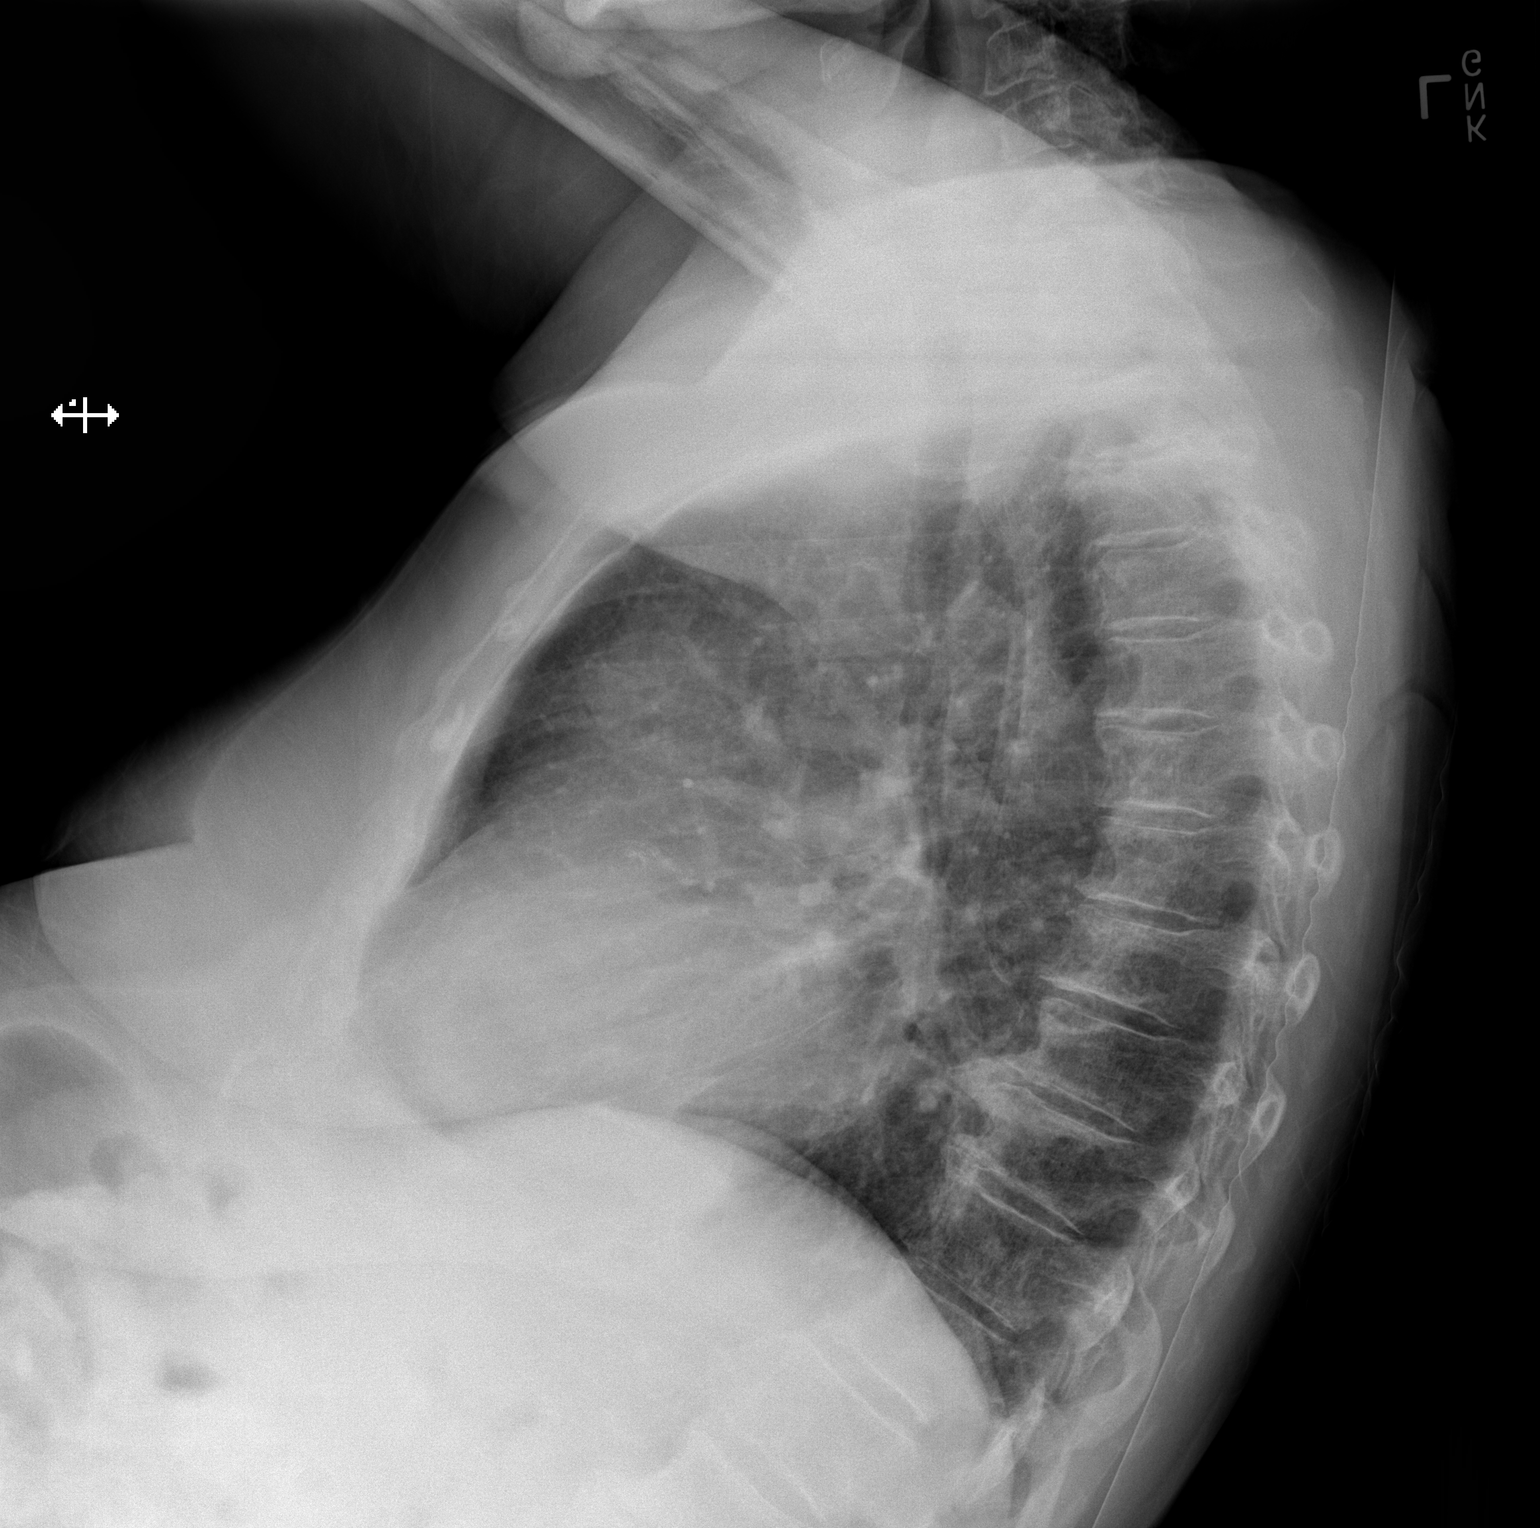

[x chest ap]
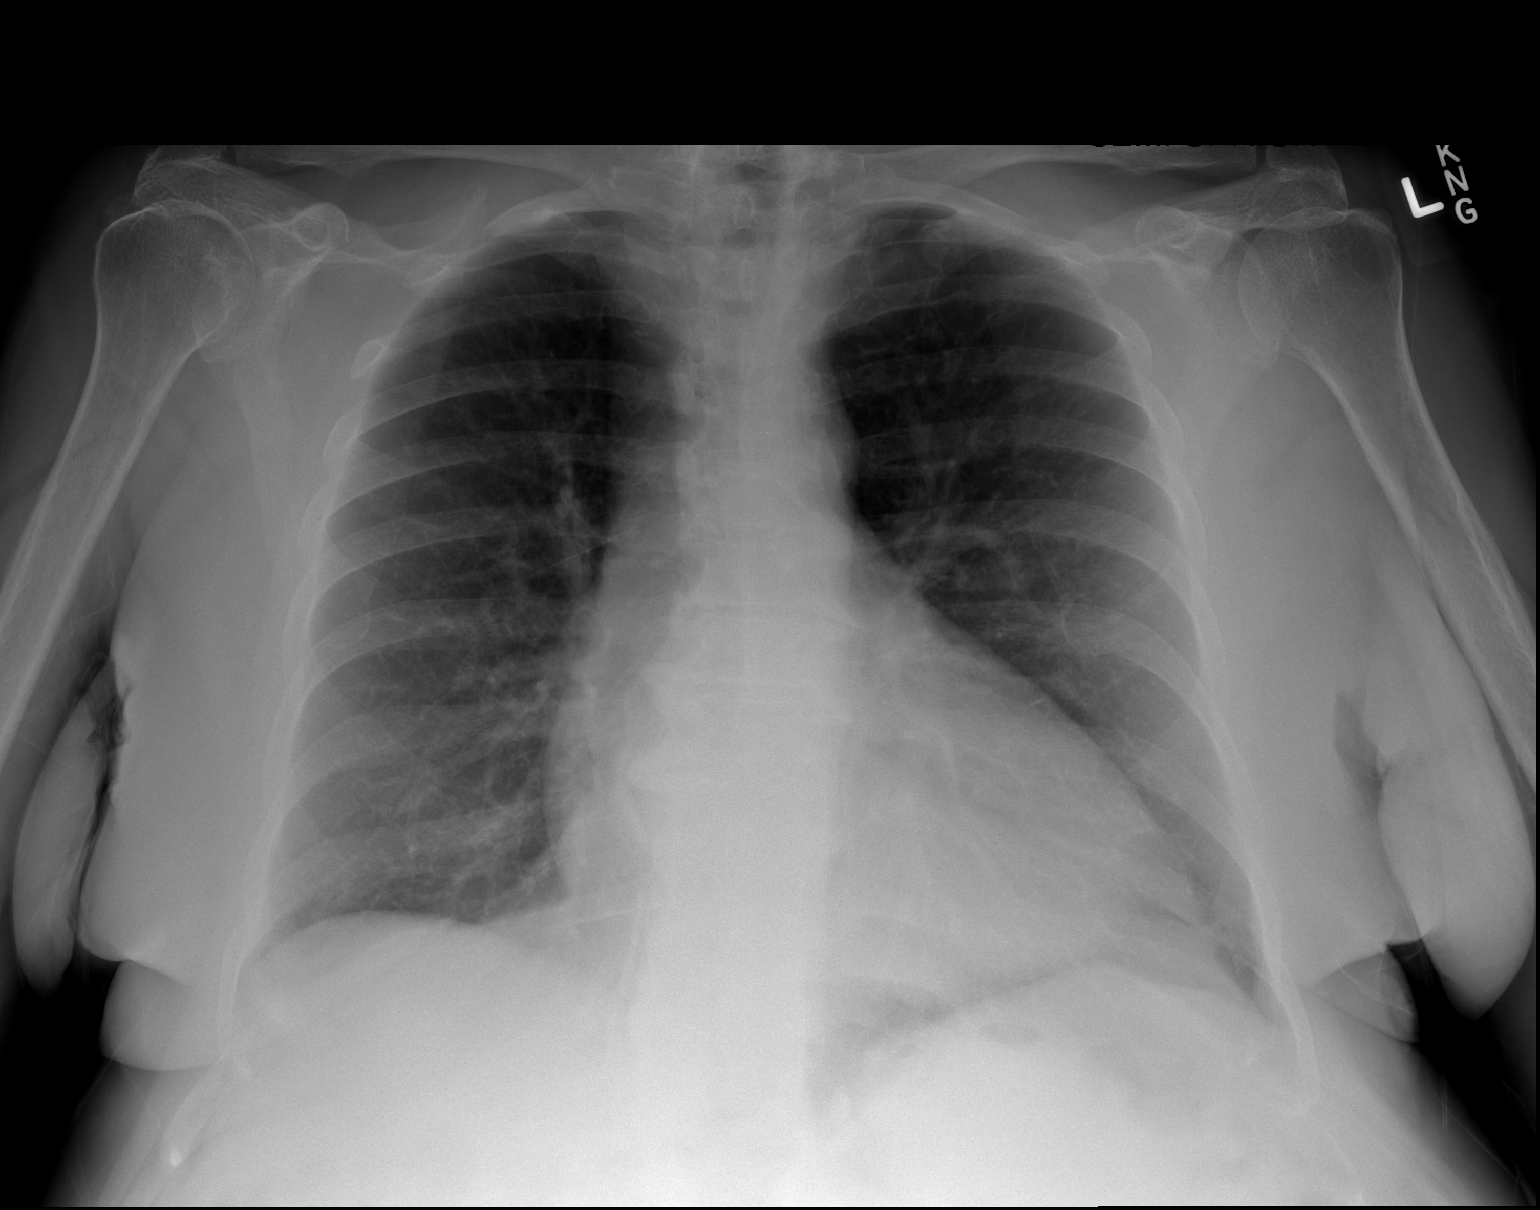

[2 of 2 positions shown; findings below may reference images not displayed]

FINDINGS: AP semi-erect and lateral images were obtained. Cardiac silhouette
moderately enlarged, unchanged. Hilar and mediastinal contours
otherwise unremarkable. Emphysematous changes in the upper lobes,
unchanged. Lungs clear. Bronchovascular markings normal. Pulmonary
vascularity normal. No visible pleural effusions. No pneumothorax.
Degenerative changes involving the thoracic spine.
IMPRESSION: Stable cardiomegaly. COPD/emphysema. No acute cardiopulmonary
disease.

## 2015-12-10 DIAGNOSIS — M1712 Unilateral primary osteoarthritis, left knee: Secondary | ICD-10-CM | POA: Diagnosis not present

## 2015-12-24 DIAGNOSIS — J208 Acute bronchitis due to other specified organisms: Secondary | ICD-10-CM | POA: Diagnosis not present

## 2015-12-24 DIAGNOSIS — I10 Essential (primary) hypertension: Secondary | ICD-10-CM | POA: Diagnosis not present

## 2015-12-24 DIAGNOSIS — B9689 Other specified bacterial agents as the cause of diseases classified elsewhere: Secondary | ICD-10-CM | POA: Diagnosis not present

## 2015-12-28 DIAGNOSIS — M1712 Unilateral primary osteoarthritis, left knee: Secondary | ICD-10-CM | POA: Diagnosis not present

## 2016-02-29 DIAGNOSIS — M545 Low back pain: Secondary | ICD-10-CM | POA: Diagnosis not present

## 2016-02-29 DIAGNOSIS — I10 Essential (primary) hypertension: Secondary | ICD-10-CM | POA: Diagnosis not present

## 2016-02-29 DIAGNOSIS — E119 Type 2 diabetes mellitus without complications: Secondary | ICD-10-CM | POA: Diagnosis not present

## 2016-02-29 DIAGNOSIS — G8929 Other chronic pain: Secondary | ICD-10-CM | POA: Diagnosis not present

## 2016-04-01 DIAGNOSIS — Z8739 Personal history of other diseases of the musculoskeletal system and connective tissue: Secondary | ICD-10-CM | POA: Diagnosis not present

## 2016-04-01 DIAGNOSIS — R413 Other amnesia: Secondary | ICD-10-CM | POA: Diagnosis not present

## 2016-04-01 DIAGNOSIS — R140 Abdominal distension (gaseous): Secondary | ICD-10-CM | POA: Diagnosis not present

## 2016-06-02 DIAGNOSIS — J40 Bronchitis, not specified as acute or chronic: Secondary | ICD-10-CM | POA: Diagnosis not present

## 2016-06-02 DIAGNOSIS — E119 Type 2 diabetes mellitus without complications: Secondary | ICD-10-CM | POA: Diagnosis not present

## 2016-08-04 DIAGNOSIS — M1712 Unilateral primary osteoarthritis, left knee: Secondary | ICD-10-CM | POA: Diagnosis not present

## 2016-08-19 DIAGNOSIS — M1712 Unilateral primary osteoarthritis, left knee: Secondary | ICD-10-CM | POA: Diagnosis not present

## 2016-09-26 DIAGNOSIS — R413 Other amnesia: Secondary | ICD-10-CM | POA: Diagnosis not present

## 2016-09-26 DIAGNOSIS — E119 Type 2 diabetes mellitus without complications: Secondary | ICD-10-CM | POA: Diagnosis not present

## 2016-09-26 DIAGNOSIS — Z6831 Body mass index (BMI) 31.0-31.9, adult: Secondary | ICD-10-CM | POA: Diagnosis not present

## 2016-09-26 DIAGNOSIS — E042 Nontoxic multinodular goiter: Secondary | ICD-10-CM | POA: Diagnosis not present

## 2016-10-10 DIAGNOSIS — Z111 Encounter for screening for respiratory tuberculosis: Secondary | ICD-10-CM | POA: Diagnosis not present

## 2016-10-23 ENCOUNTER — Encounter (HOSPITAL_COMMUNITY): Payer: Self-pay

## 2016-10-23 ENCOUNTER — Emergency Department (HOSPITAL_COMMUNITY)
Admission: EM | Admit: 2016-10-23 | Discharge: 2016-10-24 | Disposition: A | Discharge status: Home / Self Care | Payer: Medicare Other | Attending: Emergency Medicine | Admitting: Emergency Medicine

## 2016-10-23 DIAGNOSIS — Z8673 Personal history of transient ischemic attack (TIA), and cerebral infarction without residual deficits: Secondary | ICD-10-CM | POA: Insufficient documentation

## 2016-10-23 DIAGNOSIS — S0990XA Unspecified injury of head, initial encounter: Secondary | ICD-10-CM | POA: Diagnosis not present

## 2016-10-23 DIAGNOSIS — Z96659 Presence of unspecified artificial knee joint: Secondary | ICD-10-CM | POA: Insufficient documentation

## 2016-10-23 DIAGNOSIS — W19XXXA Unspecified fall, initial encounter: Secondary | ICD-10-CM

## 2016-10-23 DIAGNOSIS — Z7982 Long term (current) use of aspirin: Secondary | ICD-10-CM | POA: Insufficient documentation

## 2016-10-23 DIAGNOSIS — I2510 Atherosclerotic heart disease of native coronary artery without angina pectoris: Secondary | ICD-10-CM | POA: Diagnosis not present

## 2016-10-23 DIAGNOSIS — Z79899 Other long term (current) drug therapy: Secondary | ICD-10-CM | POA: Insufficient documentation

## 2016-10-23 DIAGNOSIS — Z23 Encounter for immunization: Secondary | ICD-10-CM | POA: Diagnosis not present

## 2016-10-23 DIAGNOSIS — E119 Type 2 diabetes mellitus without complications: Secondary | ICD-10-CM | POA: Diagnosis not present

## 2016-10-23 DIAGNOSIS — S01312A Laceration without foreign body of left ear, initial encounter: Secondary | ICD-10-CM

## 2016-10-23 DIAGNOSIS — Y999 Unspecified external cause status: Secondary | ICD-10-CM | POA: Insufficient documentation

## 2016-10-23 DIAGNOSIS — S01322A Laceration with foreign body of left ear, initial encounter: Secondary | ICD-10-CM | POA: Diagnosis not present

## 2016-10-23 DIAGNOSIS — R102 Pelvic and perineal pain: Secondary | ICD-10-CM | POA: Diagnosis not present

## 2016-10-23 DIAGNOSIS — Y939 Activity, unspecified: Secondary | ICD-10-CM | POA: Diagnosis not present

## 2016-10-23 DIAGNOSIS — W1830XA Fall on same level, unspecified, initial encounter: Secondary | ICD-10-CM | POA: Diagnosis not present

## 2016-10-23 DIAGNOSIS — Y929 Unspecified place or not applicable: Secondary | ICD-10-CM | POA: Insufficient documentation

## 2016-10-23 MED ORDER — TETANUS-DIPHTH-ACELL PERTUSSIS 5-2.5-18.5 LF-MCG/0.5 IM SUSP
0.5000 mL | Freq: Once | INTRAMUSCULAR | Status: AC
  Administered 2016-10-24: 0.5 mL via INTRAMUSCULAR
  Filled 2016-10-23: qty 0.5

## 2016-10-23 NOTE — ED Triage Notes (Signed)
Larey Seat unknown way and daughter found her with laceration on left ear noted with bleeding, went to [facility] ER to see Dr Suszanne Conners.

## 2016-10-23 NOTE — ED Notes (Signed)
Bed: RESB Expected date:  Expected time:  Means of arrival:  Comments: 10f ear bleeding

## 2016-10-23 NOTE — ED Provider Notes (Signed)
By signing my name below, I, Teofilo Pod, attest that this documentation has been prepared under the direction and in the presence of Kristen N Ward, DO . Electronically Signed: Teofilo Pod, ED Scribe. 10/23/2016. 11:50 PM.   TIME SEEN: 11:47 PM  CHIEF COMPLAINT:  Chief Complaint  Patient presents with  . Fall     HPI:  HPI Comments:  Glenda Underwood is a 83 y.o. female with HTN, HLD who presents to the Emergency Department s/p fall that occurred PTA. Daughter reports that pt was coming inside from her porch, lost her balance and fell, hitting her head on a steel wheel barrow filled with dirt. Pt notes a laceration to her left ear. Bleeding is controlled. She has no other complaints at this time. Tetanus is not UTD. Denies other associated symptoms. Patient was seen initially at [facility] and transferred here to see Dr. Suszanne Conners with ENT.  ROS: See HPI Constitutional: no fever  Eyes: no drainage  ENT: +laceration, no runny nose   Cardiovascular:  no chest pain  Resp: no SOB  GI: no vomiting GU: no dysuria Integumentary: no rash  Allergy: no hives  Musculoskeletal: no leg swelling  Neurological: no slurred speech ROS otherwise negative  PAST MEDICAL HISTORY/PAST SURGICAL HISTORY:  Past Medical History:  Diagnosis Date  . CAD (coronary artery disease)    Stenting 2008 (no details)  . CORONARY ARTERY DISEASE 07/20/2010   with stent  . Dementia   . DEPRESSION 07/20/2010  . DM (diabetes mellitus) type II controlled, neurological manifestation (HCC) 07/20/2010  . GERD 07/20/2010  . HYPERLIPIDEMIA 07/20/2010  . HYPERTENSION 07/20/2010  . Hypokalemia   . LOW BACK PAIN 07/20/2010  . OSTEOARTHRITIS 07/20/2010  . Spinal stenosis   . TIA (transient ischemic attack)   . Tobacco abuse   . URINARY INCONTINENCE 07/20/2010  . Varicose vein of leg     MEDICATIONS:  Prior to Admission medications   Medication Sig Start Date End Date Taking? Authorizing Provider   acetaminophen (TYLENOL) 325 MG tablet Take 650 mg by mouth every 4 (four) hours as needed for pain.    Historical Provider, MD  aspirin 81 MG chewable tablet Chew 81 mg by mouth daily.    Historical Provider, MD  cholecalciferol (VITAMIN D) 1000 UNITS tablet Take 1,000 Units by mouth daily.    Historical Provider, MD  DULoxetine (CYMBALTA) 60 MG capsule Take 1 capsule (60 mg total) by mouth daily. 09/04/12   Rollene Rotunda, MD  furosemide (LASIX) 20 MG tablet Take 20 mg by mouth 2 (two) times daily as needed (Weight gain of 3lbs in one day). Takes 20 mg every day at noon. Pt may have an additional dose of 20 mg for weight gain of 3lbs or more in one day    Historical Provider, MD  gabapentin (NEURONTIN) 600 MG tablet Take 600 mg by mouth 4 (four) times daily.    Historical Provider, MD  hydrOXYzine (ATARAX/VISTARIL) 25 MG tablet Take 1 tablet (25 mg total) by mouth 3 (three) times daily as needed. 03/20/15   Elvina Sidle, MD  insulin aspart (NOVOLOG) 100 UNIT/ML injection Inject 10 Units into the skin 3 (three) times daily before meals. Hold if BS is less than 100    Historical Provider, MD  Memantine HCl ER (NAMENDA XR) 28 MG CP24 Take 28 mg by mouth daily.    Historical Provider, MD  metFORMIN (GLUCOPHAGE) 1000 MG tablet Take 1 tablet (1,000 mg total) by mouth daily with  breakfast. 03/20/15   Elvina Sidle, MD  simvastatin (ZOCOR) 20 MG tablet Take 1 tablet (20 mg total) by mouth daily. Patient taking differently: Take 10 mg by mouth at bedtime.  09/04/12   Rollene Rotunda, MD  sodium chloride (OCEAN) 0.65 % SOLN nasal spray Place 1 spray into both nostrils as needed for congestion. Patient not taking: Reported on 05/06/2015 11/13/14   Roxy Horseman, PA-C  traMADol (ULTRAM) 50 MG tablet TAKE TWO TABLETS BY MOUTH EVERY 6 HOURS AS NEEDED 07/09/15   Chelle Jeffery, PA-C  traZODone (DESYREL) 50 MG tablet Take 50 mg by mouth at bedtime.    Historical Provider, MD    ALLERGIES:  Allergies  Allergen  Reactions  . Penicillins Anaphylaxis    SOCIAL HISTORY:  Social History  Substance Use Topics  . Smoking status: Current Every Day Smoker    Packs/day: 0.50    Years: 15.00    Types: Cigarettes  . Smokeless tobacco: Never Used     Comment: smokes 2-3 per day  . Alcohol use No    FAMILY HISTORY: Family History  Problem Relation Age of Onset  . Hypertension Mother   . Diabetes Mother   . Urinary tract infection Mother   . Heart attack Father   . Ulcerative colitis Sister   . Heart disease Sister   . Breast cancer Sister     EXAM: BP (!) 171/68 (BP Location: Left Arm)   Pulse 70   Temp 98.1 F (36.7 C) (Oral)   Resp 18   Ht  (1.499 m)   Wt 145 lb (65.8 kg)   SpO2 98%   BMI 29.29 kg/m   CONSTITUTIONAL: Alert and oriented and responds appropriately to questions. Well-appearing; well-nourished; GCS 15, elderly HEAD: Normocephalic; atraumatic EYES: Conjunctivae clear, PERRL, EOMI ENT: normal nose; no rhinorrhea; moist mucous membranes; pharynx without lesions noted; no dental injury; no septal hematoma, patient has a recent laceration to the lateral aspect of the auricle of the left ear that is through and through through the cartilage that is hemostatic, bruising noted to the top of the work all NECK: Supple, no meningismus, no LAD; no midline spinal tenderness, step-off or deformity; trachea midline CARD: RRR; S1 and S2 appreciated; no murmurs, no clicks, no rubs, no gallops RESP: Normal chest excursion without splinting or tachypnea; breath sounds clear and equal bilaterally; no wheezes, no rhonchi, no rales; no hypoxia or respiratory distress CHEST:  chest wall stable, no crepitus or ecchymosis or deformity, nontender to palpation; no flail chest ABD/GI: Normal bowel sounds; non-distended; soft, non-tender, no rebound, no guarding; no ecchymosis or other lesions noted PELVIS:  stable, nontender to palpation BACK:  The back appears normal and is non-tender to  palpation, there is no CVA tenderness; no midline spinal tenderness, step-off or deformity EXT: Normal ROM in all joints; non-tender to palpation; no edema; normal capillary refill; no cyanosis, no bony tenderness or bony deformity of patient's extremities, no joint effusion, compartments are soft, extremities are warm and well-perfused, no ecchymosis SKIN: Normal color for age and race; warm NEURO: Moves all extremities equally, sensation to light touch intact diffusely, cranial nerves II through XII intact, normal speech PSYCH: The patient's mood and manner are appropriate. Grooming and personal hygiene are appropriate.  MEDICAL DECISION MAKING: Patient here after mechanical fall. Has laceration to the ear and was sent from [facility] to see ear nose and throat physician Dr. Suszanne Conners.  Patient denies any pain at this time. On review of workup  done outside hospital patient had normal labs, EKG, CT head, x-ray of her pelvis. I will update her tetanus vaccination here.  Will keep NPO.  ED PROGRESS: 1:00 AM  Dr. Suszanne Conners has seen patient and repaired her ear laceration. He recommends sending patient home on clindamycin for the next 5 days and a short course of Vicodin for pain control as needed. They can follow-up in his office in one week for suture removal. Discussed head injury return cautions. Patient and family are comfortable with plan.   At this time, I do not feel there is any life-threatening condition present. I have reviewed and discussed all results (EKG, imaging, lab, urine as appropriate) and exam findings with patient/family. I have reviewed nursing notes and appropriate previous records.  I feel the patient is safe to be discharged home without further emergent workup and can continue workup as an outpatient as needed. Discussed usual and customary return precautions. Patient/family verbalize understanding and are comfortable with this plan.  Outpatient follow-up has been provided if needed.  All questions have been answered.      I personally performed the services described in this documentation, which was scribed in my presence. The recorded information has been reviewed and is accurate.   Layla Maw Ward, DO 10/24/16 [phone]

## 2016-10-24 DIAGNOSIS — S01322A Laceration with foreign body of left ear, initial encounter: Secondary | ICD-10-CM | POA: Diagnosis not present

## 2016-10-24 DIAGNOSIS — S01312A Laceration without foreign body of left ear, initial encounter: Secondary | ICD-10-CM | POA: Diagnosis not present

## 2016-10-24 MED ORDER — CLINDAMYCIN HCL 300 MG PO CAPS
300.0000 mg | ORAL_CAPSULE | Freq: Once | ORAL | Status: AC
  Administered 2016-10-24: 300 mg via ORAL
  Filled 2016-10-24: qty 1

## 2016-10-24 MED ORDER — CLINDAMYCIN HCL 300 MG PO CAPS: 300.0000 mg | ORAL_CAPSULE | Freq: Three times a day (TID) | ORAL | 0 refills | Status: AC

## 2016-10-24 MED ORDER — HYDROCODONE-ACETAMINOPHEN 5-325 MG PO TABS: 1.0000 | ORAL_TABLET | Freq: Four times a day (QID) | ORAL | 0 refills | Status: AC | PRN

## 2016-10-24 MED ORDER — LIDOCAINE HCL (PF) 1 % IJ SOLN
30.0000 mL | Freq: Once | INTRAMUSCULAR | Status: DC
  Filled 2016-10-24: qty 30

## 2016-10-24 NOTE — Consult Note (Signed)
Reason for Consult: Complex left ear laceration  HPI:  Glenda Underwood is an 83 y.o. female who presents to the [facility] ER s/p fall, with a large and complex left ear laceration. Daughter reports that pt was coming inside from her porch, lost her balance and fell, hitting her head on a steel wheel barrow filled with dirt. Pt notes a laceration to her left ear. She has no other complaints at this time. Tetanus is not UTD. Denies other associated symptoms. Patient was seen initially at [facility] and transferred here for further evaluation and treatment.  Past Medical History:  Diagnosis Date  . CAD (coronary artery disease)    Stenting 2008 (no details)  . CORONARY ARTERY DISEASE 07/20/2010   with stent  . Dementia   . DEPRESSION 07/20/2010  . DM (diabetes mellitus) type II controlled, neurological manifestation (HCC) 07/20/2010  . GERD 07/20/2010  . HYPERLIPIDEMIA 07/20/2010  . HYPERTENSION 07/20/2010  . Hypokalemia   . LOW BACK PAIN 07/20/2010  . OSTEOARTHRITIS 07/20/2010  . Spinal stenosis   . TIA (transient ischemic attack)   . Tobacco abuse   . URINARY INCONTINENCE 07/20/2010  . Varicose vein of leg     Past Surgical History:  Procedure Laterality Date  . ABDOMINAL HYSTERECTOMY    . CORONARY STENT PLACEMENT    . TOTAL KNEE ARTHROPLASTY    . VARICOSE VEIN SURGERY    . VITRECTOMY Left     Family History  Problem Relation Age of Onset  . Hypertension Mother   . Diabetes Mother   . Urinary tract infection Mother   . Heart attack Father   . Ulcerative colitis Sister   . Heart disease Sister   . Breast cancer Sister     Social History:  reports that she has been smoking Cigarettes.  She has a 7.50 pack-year smoking history. She has never used smokeless tobacco. She reports that she does not drink alcohol or use drugs.  Allergies:  Allergies  Allergen Reactions  . Penicillins Anaphylaxis    Prior to Admission medications   Medication Sig Start Date End Date  Taking? Authorizing Provider  acetaminophen (TYLENOL) 325 MG tablet Take 650 mg by mouth every 4 (four) hours as needed for pain.    Historical Provider, MD  aspirin 81 MG chewable tablet Chew 81 mg by mouth daily.    Historical Provider, MD  cholecalciferol (VITAMIN D) 1000 UNITS tablet Take 1,000 Units by mouth daily.    Historical Provider, MD  DULoxetine (CYMBALTA) 60 MG capsule Take 1 capsule (60 mg total) by mouth daily. 09/04/12   Rollene Rotunda, MD  furosemide (LASIX) 20 MG tablet Take 20 mg by mouth 2 (two) times daily as needed (Weight gain of 3lbs in one day). Takes 20 mg every day at noon. Pt may have an additional dose of 20 mg for weight gain of 3lbs or more in one day    Historical Provider, MD  gabapentin (NEURONTIN) 600 MG tablet Take 600 mg by mouth 4 (four) times daily.    Historical Provider, MD  hydrOXYzine (ATARAX/VISTARIL) 25 MG tablet Take 1 tablet (25 mg total) by mouth 3 (three) times daily as needed. 03/20/15   Elvina Sidle, MD  Memantine HCl ER (NAMENDA XR) 28 MG CP24 Take 28 mg by mouth daily.    Historical Provider, MD  metFORMIN (GLUCOPHAGE) 1000 MG tablet Take 1 tablet (1,000 mg total) by mouth daily with breakfast. 03/20/15   Elvina Sidle, MD  simvastatin [facility])  20 MG tablet Take 1 tablet (20 mg total) by mouth daily. Patient taking differently: Take 10 mg by mouth at bedtime.  09/04/12   Rollene Rotunda, MD  sodium chloride (OCEAN) 0.65 % SOLN nasal spray Place 1 spray into both nostrils as needed for congestion. Patient not taking: Reported on 05/06/2015 11/13/14   Roxy Horseman, PA-C  traMADol (ULTRAM) 50 MG tablet TAKE TWO TABLETS BY MOUTH EVERY 6 HOURS AS NEEDED 07/09/15   Porfirio Oar, PA-C    No results found for this or any previous visit (from the past 48 hour(s)).  No results found.  ROS: Constitutional: no fever  Eyes: no drainage  ENT: +laceration, no runny nose   Cardiovascular:  no chest pain  Resp: no SOB  GI: no vomiting GU: no  dysuria Integumentary: no rash  Allergy: no hives  Musculoskeletal: no leg swelling  Neurological: no slurred speech ROS otherwise negative  Blood pressure (!) 171/68, pulse 70, temperature 98.1 F (36.7 C), temperature source Oral, resp. rate 18, height  (1.499 m), weight 145 lb (65.8 kg), SpO2 98 %.  CONSTITUTIONAL: Alert and oriented and responds appropriately to questions. Well-appearing; well-nourished; GCS 15, elderly HEAD: Normocephalic; atraumatic EYES: Conjunctivae clear, PERRL, EOMI EARS: A large thru-and-thru 5cm left auricular laceration. The EACs are o/w normal. NOSE: Normal nose; no rhinorrhea; moist mucous membranes;  MOUTH: Pharynx without lesions noted; no dental injury; no septal hematoma. NECK: Supple, no meningismus, no LAD; no midline spinal tenderness, step-off or deformity; trachea midline RESP: Normal chest excursion without splinting or tachypnea; breath sounds clear and equal bilaterally; no wheezes, no rhonchi, no rales; no hypoxia or respiratory distress CHEST:  chest wall stable, no crepitus or ecchymosis or deformity, nontender to palpation; no flail chest EXT: Normal ROM in all joints; non-tender to palpation; no edema; normal capillary refill; no cyanosis, no bony tenderness or bony deformity. SKIN: Normal color for age and race; warm NEURO: Moves all extremities equally, sensation to light touch intact diffusely, cranial nerves II through XII intact, normal speech PSYCH: The patient's mood and manner are appropriate. Grooming and personal hygiene are appropriate.  Procedure: Complex repair of left ear laceration (5cm) Anesthesia: Local anesthesia with 1% lidocaine with 1:100,000 epinephrine Description: The patient is placed supine on the hospital table. The left is prepped and draped in a sterile fashion.  After adequate local anesthesia is achieved, the laceration site is carefully debrided.  Soft tissue undermining is performed to release the skin  tension. The laceration is closed in layers with interrupted sutures.  The patient tolerated the procedure well.    Assessment/Plan: Complex left ear laceration, repaired in the ER. Pt will be discharged home on oral clindamycin for 5 days and pain med PRN. She may return for suture removal in 1 week.  Mancil Pfenning W Indie Nickerson 10/24/2016, 1:02 AM

## 2016-11-21 DIAGNOSIS — R69 Illness, unspecified: Secondary | ICD-10-CM | POA: Diagnosis not present

## 2016-11-21 DIAGNOSIS — Z6831 Body mass index (BMI) 31.0-31.9, adult: Secondary | ICD-10-CM | POA: Diagnosis not present

## 2016-11-21 DIAGNOSIS — M25552 Pain in left hip: Secondary | ICD-10-CM | POA: Diagnosis not present

## 2016-11-21 DIAGNOSIS — M79651 Pain in right thigh: Secondary | ICD-10-CM | POA: Diagnosis not present

## 2016-11-21 DIAGNOSIS — R531 Weakness: Secondary | ICD-10-CM | POA: Diagnosis not present

## 2016-11-21 DIAGNOSIS — M25551 Pain in right hip: Secondary | ICD-10-CM | POA: Diagnosis not present

## 2016-11-21 DIAGNOSIS — M25561 Pain in right knee: Secondary | ICD-10-CM | POA: Diagnosis not present

## 2016-11-23 DIAGNOSIS — M545 Low back pain: Secondary | ICD-10-CM | POA: Diagnosis not present

## 2016-11-23 DIAGNOSIS — R296 Repeated falls: Secondary | ICD-10-CM | POA: Diagnosis not present

## 2016-11-23 DIAGNOSIS — I10 Essential (primary) hypertension: Secondary | ICD-10-CM | POA: Diagnosis not present

## 2016-11-23 DIAGNOSIS — E119 Type 2 diabetes mellitus without complications: Secondary | ICD-10-CM | POA: Diagnosis not present

## 2016-11-23 DIAGNOSIS — R2689 Other abnormalities of gait and mobility: Secondary | ICD-10-CM | POA: Diagnosis not present

## 2016-11-23 DIAGNOSIS — F0390 Unspecified dementia without behavioral disturbance: Secondary | ICD-10-CM | POA: Diagnosis not present

## 2016-11-25 DIAGNOSIS — I10 Essential (primary) hypertension: Secondary | ICD-10-CM | POA: Diagnosis not present

## 2016-11-25 DIAGNOSIS — R296 Repeated falls: Secondary | ICD-10-CM | POA: Diagnosis not present

## 2016-11-25 DIAGNOSIS — M545 Low back pain: Secondary | ICD-10-CM | POA: Diagnosis not present

## 2016-11-25 DIAGNOSIS — E119 Type 2 diabetes mellitus without complications: Secondary | ICD-10-CM | POA: Diagnosis not present

## 2016-11-25 DIAGNOSIS — R2689 Other abnormalities of gait and mobility: Secondary | ICD-10-CM | POA: Diagnosis not present

## 2016-11-25 DIAGNOSIS — F0390 Unspecified dementia without behavioral disturbance: Secondary | ICD-10-CM | POA: Diagnosis not present

## 2016-12-01 DIAGNOSIS — R296 Repeated falls: Secondary | ICD-10-CM | POA: Diagnosis not present

## 2016-12-29 DIAGNOSIS — R296 Repeated falls: Secondary | ICD-10-CM | POA: Diagnosis not present

## 2016-12-29 DIAGNOSIS — R2689 Other abnormalities of gait and mobility: Secondary | ICD-10-CM | POA: Diagnosis not present

## 2017-01-03 DIAGNOSIS — M6281 Muscle weakness (generalized): Secondary | ICD-10-CM | POA: Diagnosis not present

## 2017-03-15 DIAGNOSIS — I10 Essential (primary) hypertension: Secondary | ICD-10-CM | POA: Diagnosis not present

## 2017-03-15 DIAGNOSIS — G8929 Other chronic pain: Secondary | ICD-10-CM | POA: Diagnosis not present

## 2017-03-15 DIAGNOSIS — M545 Low back pain: Secondary | ICD-10-CM | POA: Diagnosis not present

## 2017-03-15 DIAGNOSIS — J40 Bronchitis, not specified as acute or chronic: Secondary | ICD-10-CM | POA: Diagnosis not present

## 2017-03-30 DIAGNOSIS — G479 Sleep disorder, unspecified: Secondary | ICD-10-CM | POA: Diagnosis not present

## 2017-05-24 DIAGNOSIS — J40 Bronchitis, not specified as acute or chronic: Secondary | ICD-10-CM | POA: Diagnosis not present

## 2017-05-24 DIAGNOSIS — M545 Low back pain: Secondary | ICD-10-CM | POA: Diagnosis not present

## 2017-05-24 DIAGNOSIS — E119 Type 2 diabetes mellitus without complications: Secondary | ICD-10-CM | POA: Diagnosis not present

## 2017-05-24 DIAGNOSIS — G8929 Other chronic pain: Secondary | ICD-10-CM | POA: Diagnosis not present

## 2017-05-28 DIAGNOSIS — F0390 Unspecified dementia without behavioral disturbance: Secondary | ICD-10-CM | POA: Diagnosis not present

## 2017-05-28 DIAGNOSIS — R4182 Altered mental status, unspecified: Secondary | ICD-10-CM | POA: Diagnosis not present

## 2017-05-28 DIAGNOSIS — F05 Delirium due to known physiological condition: Secondary | ICD-10-CM | POA: Diagnosis present

## 2017-05-28 DIAGNOSIS — I6523 Occlusion and stenosis of bilateral carotid arteries: Secondary | ICD-10-CM | POA: Diagnosis not present

## 2017-05-28 DIAGNOSIS — R279 Unspecified lack of coordination: Secondary | ICD-10-CM | POA: Diagnosis not present

## 2017-05-28 DIAGNOSIS — I639 Cerebral infarction, unspecified: Secondary | ICD-10-CM | POA: Diagnosis not present

## 2017-05-28 DIAGNOSIS — F419 Anxiety disorder, unspecified: Secondary | ICD-10-CM | POA: Diagnosis not present

## 2017-05-28 DIAGNOSIS — Z7401 Bed confinement status: Secondary | ICD-10-CM | POA: Diagnosis not present

## 2017-05-28 DIAGNOSIS — R278 Other lack of coordination: Secondary | ICD-10-CM | POA: Diagnosis not present

## 2017-05-28 DIAGNOSIS — R2681 Unsteadiness on feet: Secondary | ICD-10-CM | POA: Diagnosis not present

## 2017-05-28 DIAGNOSIS — Z880 Allergy status to penicillin: Secondary | ICD-10-CM | POA: Diagnosis not present

## 2017-05-28 DIAGNOSIS — R262 Difficulty in walking, not elsewhere classified: Secondary | ICD-10-CM | POA: Diagnosis not present

## 2017-05-28 DIAGNOSIS — Z741 Need for assistance with personal care: Secondary | ICD-10-CM | POA: Diagnosis not present

## 2017-05-28 DIAGNOSIS — F329 Major depressive disorder, single episode, unspecified: Secondary | ICD-10-CM | POA: Diagnosis present

## 2017-05-28 DIAGNOSIS — I252 Old myocardial infarction: Secondary | ICD-10-CM | POA: Diagnosis not present

## 2017-05-28 DIAGNOSIS — I10 Essential (primary) hypertension: Secondary | ICD-10-CM | POA: Diagnosis not present

## 2017-05-28 DIAGNOSIS — R410 Disorientation, unspecified: Secondary | ICD-10-CM | POA: Diagnosis not present

## 2017-05-28 DIAGNOSIS — E785 Hyperlipidemia, unspecified: Secondary | ICD-10-CM | POA: Diagnosis not present

## 2017-05-28 DIAGNOSIS — R4701 Aphasia: Secondary | ICD-10-CM | POA: Diagnosis present

## 2017-05-28 DIAGNOSIS — Z794 Long term (current) use of insulin: Secondary | ICD-10-CM | POA: Diagnosis not present

## 2017-05-28 DIAGNOSIS — E119 Type 2 diabetes mellitus without complications: Secondary | ICD-10-CM | POA: Diagnosis not present

## 2017-05-28 DIAGNOSIS — K219 Gastro-esophageal reflux disease without esophagitis: Secondary | ICD-10-CM | POA: Diagnosis not present

## 2017-05-28 DIAGNOSIS — I6789 Other cerebrovascular disease: Secondary | ICD-10-CM | POA: Diagnosis not present

## 2017-05-28 DIAGNOSIS — R6889 Other general symptoms and signs: Secondary | ICD-10-CM | POA: Diagnosis not present

## 2017-05-28 DIAGNOSIS — Z79899 Other long term (current) drug therapy: Secondary | ICD-10-CM | POA: Diagnosis not present

## 2017-05-28 DIAGNOSIS — M6281 Muscle weakness (generalized): Secondary | ICD-10-CM | POA: Diagnosis not present

## 2017-05-28 DIAGNOSIS — Z23 Encounter for immunization: Secondary | ICD-10-CM | POA: Diagnosis not present

## 2017-05-28 DIAGNOSIS — R079 Chest pain, unspecified: Secondary | ICD-10-CM | POA: Diagnosis not present

## 2017-05-28 DIAGNOSIS — R29705 NIHSS score 5: Secondary | ICD-10-CM | POA: Diagnosis present

## 2017-05-28 DIAGNOSIS — R404 Transient alteration of awareness: Secondary | ICD-10-CM | POA: Diagnosis not present

## 2017-05-29 DIAGNOSIS — I6789 Other cerebrovascular disease: Secondary | ICD-10-CM

## 2017-05-31 DIAGNOSIS — Z741 Need for assistance with personal care: Secondary | ICD-10-CM | POA: Diagnosis not present

## 2017-05-31 DIAGNOSIS — Z79899 Other long term (current) drug therapy: Secondary | ICD-10-CM | POA: Diagnosis not present

## 2017-05-31 DIAGNOSIS — K219 Gastro-esophageal reflux disease without esophagitis: Secondary | ICD-10-CM | POA: Diagnosis not present

## 2017-05-31 DIAGNOSIS — R29705 NIHSS score 5: Secondary | ICD-10-CM | POA: Diagnosis not present

## 2017-05-31 DIAGNOSIS — R2681 Unsteadiness on feet: Secondary | ICD-10-CM | POA: Diagnosis not present

## 2017-05-31 DIAGNOSIS — R279 Unspecified lack of coordination: Secondary | ICD-10-CM | POA: Diagnosis not present

## 2017-05-31 DIAGNOSIS — I5032 Chronic diastolic (congestive) heart failure: Secondary | ICD-10-CM | POA: Diagnosis not present

## 2017-05-31 DIAGNOSIS — Z7401 Bed confinement status: Secondary | ICD-10-CM | POA: Diagnosis not present

## 2017-05-31 DIAGNOSIS — F419 Anxiety disorder, unspecified: Secondary | ICD-10-CM | POA: Diagnosis not present

## 2017-05-31 DIAGNOSIS — I10 Essential (primary) hypertension: Secondary | ICD-10-CM | POA: Diagnosis not present

## 2017-05-31 DIAGNOSIS — F329 Major depressive disorder, single episode, unspecified: Secondary | ICD-10-CM | POA: Diagnosis not present

## 2017-05-31 DIAGNOSIS — I639 Cerebral infarction, unspecified: Secondary | ICD-10-CM | POA: Diagnosis not present

## 2017-05-31 DIAGNOSIS — I6359 Cerebral infarction due to unspecified occlusion or stenosis of other cerebral artery: Secondary | ICD-10-CM | POA: Diagnosis not present

## 2017-05-31 DIAGNOSIS — G301 Alzheimer's disease with late onset: Secondary | ICD-10-CM | POA: Diagnosis not present

## 2017-05-31 DIAGNOSIS — E119 Type 2 diabetes mellitus without complications: Secondary | ICD-10-CM | POA: Diagnosis not present

## 2017-05-31 DIAGNOSIS — Z880 Allergy status to penicillin: Secondary | ICD-10-CM | POA: Diagnosis not present

## 2017-05-31 DIAGNOSIS — I252 Old myocardial infarction: Secondary | ICD-10-CM | POA: Diagnosis not present

## 2017-05-31 DIAGNOSIS — Z23 Encounter for immunization: Secondary | ICD-10-CM | POA: Diagnosis not present

## 2017-05-31 DIAGNOSIS — M6281 Muscle weakness (generalized): Secondary | ICD-10-CM | POA: Diagnosis not present

## 2017-05-31 DIAGNOSIS — F0390 Unspecified dementia without behavioral disturbance: Secondary | ICD-10-CM | POA: Diagnosis not present

## 2017-05-31 DIAGNOSIS — R4701 Aphasia: Secondary | ICD-10-CM | POA: Diagnosis not present

## 2017-05-31 DIAGNOSIS — R262 Difficulty in walking, not elsewhere classified: Secondary | ICD-10-CM | POA: Diagnosis not present

## 2017-05-31 DIAGNOSIS — Z794 Long term (current) use of insulin: Secondary | ICD-10-CM | POA: Diagnosis not present

## 2017-05-31 DIAGNOSIS — E785 Hyperlipidemia, unspecified: Secondary | ICD-10-CM | POA: Diagnosis not present

## 2017-05-31 DIAGNOSIS — R278 Other lack of coordination: Secondary | ICD-10-CM | POA: Diagnosis not present

## 2017-05-31 DIAGNOSIS — F05 Delirium due to known physiological condition: Secondary | ICD-10-CM | POA: Diagnosis not present

## 2017-06-01 DIAGNOSIS — I10 Essential (primary) hypertension: Secondary | ICD-10-CM | POA: Diagnosis not present

## 2017-06-01 DIAGNOSIS — I6359 Cerebral infarction due to unspecified occlusion or stenosis of other cerebral artery: Secondary | ICD-10-CM | POA: Diagnosis not present

## 2017-06-01 DIAGNOSIS — E119 Type 2 diabetes mellitus without complications: Secondary | ICD-10-CM | POA: Diagnosis not present

## 2017-06-01 DIAGNOSIS — I5032 Chronic diastolic (congestive) heart failure: Secondary | ICD-10-CM | POA: Diagnosis not present

## 2017-06-02 DIAGNOSIS — I6359 Cerebral infarction due to unspecified occlusion or stenosis of other cerebral artery: Secondary | ICD-10-CM | POA: Diagnosis not present

## 2017-06-02 DIAGNOSIS — I5032 Chronic diastolic (congestive) heart failure: Secondary | ICD-10-CM | POA: Diagnosis not present

## 2017-06-02 DIAGNOSIS — G301 Alzheimer's disease with late onset: Secondary | ICD-10-CM | POA: Diagnosis not present

## 2017-06-02 DIAGNOSIS — I10 Essential (primary) hypertension: Secondary | ICD-10-CM | POA: Diagnosis not present

## 2017-06-09 DIAGNOSIS — E119 Type 2 diabetes mellitus without complications: Secondary | ICD-10-CM | POA: Diagnosis not present

## 2017-06-09 DIAGNOSIS — I5032 Chronic diastolic (congestive) heart failure: Secondary | ICD-10-CM | POA: Diagnosis not present

## 2017-06-09 DIAGNOSIS — I6359 Cerebral infarction due to unspecified occlusion or stenosis of other cerebral artery: Secondary | ICD-10-CM | POA: Diagnosis not present

## 2017-06-09 DIAGNOSIS — I10 Essential (primary) hypertension: Secondary | ICD-10-CM | POA: Diagnosis not present

## 2017-06-15 DIAGNOSIS — I10 Essential (primary) hypertension: Secondary | ICD-10-CM | POA: Diagnosis not present

## 2017-06-15 DIAGNOSIS — E119 Type 2 diabetes mellitus without complications: Secondary | ICD-10-CM | POA: Diagnosis not present

## 2017-06-15 DIAGNOSIS — I6359 Cerebral infarction due to unspecified occlusion or stenosis of other cerebral artery: Secondary | ICD-10-CM | POA: Diagnosis not present

## 2017-06-15 DIAGNOSIS — I5032 Chronic diastolic (congestive) heart failure: Secondary | ICD-10-CM | POA: Diagnosis not present

## 2017-06-19 DIAGNOSIS — F0281 Dementia in other diseases classified elsewhere with behavioral disturbance: Secondary | ICD-10-CM | POA: Diagnosis not present

## 2017-06-19 DIAGNOSIS — R451 Restlessness and agitation: Secondary | ICD-10-CM | POA: Diagnosis not present

## 2017-06-19 DIAGNOSIS — G301 Alzheimer's disease with late onset: Secondary | ICD-10-CM | POA: Diagnosis not present

## 2017-06-19 DIAGNOSIS — F339 Major depressive disorder, recurrent, unspecified: Secondary | ICD-10-CM | POA: Diagnosis not present

## 2017-06-22 DIAGNOSIS — I10 Essential (primary) hypertension: Secondary | ICD-10-CM | POA: Diagnosis not present

## 2017-06-22 DIAGNOSIS — I6359 Cerebral infarction due to unspecified occlusion or stenosis of other cerebral artery: Secondary | ICD-10-CM | POA: Diagnosis not present

## 2017-06-22 DIAGNOSIS — E119 Type 2 diabetes mellitus without complications: Secondary | ICD-10-CM | POA: Diagnosis not present

## 2017-06-22 DIAGNOSIS — I5032 Chronic diastolic (congestive) heart failure: Secondary | ICD-10-CM | POA: Diagnosis not present

## 2017-07-06 DIAGNOSIS — E119 Type 2 diabetes mellitus without complications: Secondary | ICD-10-CM | POA: Diagnosis not present

## 2017-07-06 DIAGNOSIS — I5032 Chronic diastolic (congestive) heart failure: Secondary | ICD-10-CM | POA: Diagnosis not present

## 2017-07-06 DIAGNOSIS — I10 Essential (primary) hypertension: Secondary | ICD-10-CM | POA: Diagnosis not present

## 2017-07-06 DIAGNOSIS — I6359 Cerebral infarction due to unspecified occlusion or stenosis of other cerebral artery: Secondary | ICD-10-CM | POA: Diagnosis not present

## 2017-07-18 DIAGNOSIS — F3341 Major depressive disorder, recurrent, in partial remission: Secondary | ICD-10-CM | POA: Diagnosis not present

## 2017-07-18 DIAGNOSIS — I6359 Cerebral infarction due to unspecified occlusion or stenosis of other cerebral artery: Secondary | ICD-10-CM | POA: Diagnosis not present

## 2017-07-18 DIAGNOSIS — I10 Essential (primary) hypertension: Secondary | ICD-10-CM | POA: Diagnosis not present

## 2017-07-18 DIAGNOSIS — I5032 Chronic diastolic (congestive) heart failure: Secondary | ICD-10-CM | POA: Diagnosis not present

## 2017-07-29 DIAGNOSIS — I517 Cardiomegaly: Secondary | ICD-10-CM | POA: Diagnosis not present

## 2017-07-29 DIAGNOSIS — R0602 Shortness of breath: Secondary | ICD-10-CM | POA: Diagnosis not present

## 2017-08-03 DIAGNOSIS — I10 Essential (primary) hypertension: Secondary | ICD-10-CM | POA: Diagnosis not present

## 2017-08-03 DIAGNOSIS — I6359 Cerebral infarction due to unspecified occlusion or stenosis of other cerebral artery: Secondary | ICD-10-CM | POA: Diagnosis not present

## 2017-08-03 DIAGNOSIS — E119 Type 2 diabetes mellitus without complications: Secondary | ICD-10-CM | POA: Diagnosis not present

## 2017-08-03 DIAGNOSIS — I5032 Chronic diastolic (congestive) heart failure: Secondary | ICD-10-CM | POA: Diagnosis not present

## 2017-08-10 DIAGNOSIS — R451 Restlessness and agitation: Secondary | ICD-10-CM | POA: Diagnosis not present

## 2017-08-10 DIAGNOSIS — F339 Major depressive disorder, recurrent, unspecified: Secondary | ICD-10-CM | POA: Diagnosis not present

## 2017-08-10 DIAGNOSIS — F0280 Dementia in other diseases classified elsewhere without behavioral disturbance: Secondary | ICD-10-CM | POA: Diagnosis not present

## 2017-08-10 DIAGNOSIS — G309 Alzheimer's disease, unspecified: Secondary | ICD-10-CM | POA: Diagnosis not present

## 2017-08-11 DIAGNOSIS — I10 Essential (primary) hypertension: Secondary | ICD-10-CM | POA: Diagnosis not present

## 2017-08-11 DIAGNOSIS — G301 Alzheimer's disease with late onset: Secondary | ICD-10-CM | POA: Diagnosis not present

## 2017-08-11 DIAGNOSIS — F339 Major depressive disorder, recurrent, unspecified: Secondary | ICD-10-CM | POA: Diagnosis not present

## 2017-08-11 DIAGNOSIS — E119 Type 2 diabetes mellitus without complications: Secondary | ICD-10-CM | POA: Diagnosis not present

## 2017-08-21 DIAGNOSIS — F0281 Dementia in other diseases classified elsewhere with behavioral disturbance: Secondary | ICD-10-CM | POA: Diagnosis not present

## 2017-08-21 DIAGNOSIS — G309 Alzheimer's disease, unspecified: Secondary | ICD-10-CM | POA: Diagnosis not present

## 2017-08-21 DIAGNOSIS — F0150 Vascular dementia without behavioral disturbance: Secondary | ICD-10-CM | POA: Diagnosis not present

## 2017-08-21 DIAGNOSIS — F419 Anxiety disorder, unspecified: Secondary | ICD-10-CM | POA: Diagnosis not present

## 2017-08-24 DIAGNOSIS — J811 Chronic pulmonary edema: Secondary | ICD-10-CM | POA: Diagnosis not present

## 2017-08-24 DIAGNOSIS — I517 Cardiomegaly: Secondary | ICD-10-CM | POA: Diagnosis not present

## 2017-08-24 DIAGNOSIS — J90 Pleural effusion, not elsewhere classified: Secondary | ICD-10-CM | POA: Diagnosis not present

## 2017-08-25 DIAGNOSIS — J90 Pleural effusion, not elsewhere classified: Secondary | ICD-10-CM | POA: Diagnosis not present

## 2017-08-29 DIAGNOSIS — Z09 Encounter for follow-up examination after completed treatment for conditions other than malignant neoplasm: Secondary | ICD-10-CM | POA: Diagnosis not present

## 2017-08-29 DIAGNOSIS — I517 Cardiomegaly: Secondary | ICD-10-CM | POA: Diagnosis not present

## 2017-08-29 DIAGNOSIS — Z79899 Other long term (current) drug therapy: Secondary | ICD-10-CM | POA: Diagnosis not present

## 2017-09-01 DIAGNOSIS — G301 Alzheimer's disease with late onset: Secondary | ICD-10-CM | POA: Diagnosis not present

## 2017-09-01 DIAGNOSIS — I10 Essential (primary) hypertension: Secondary | ICD-10-CM | POA: Diagnosis not present

## 2017-09-01 DIAGNOSIS — E119 Type 2 diabetes mellitus without complications: Secondary | ICD-10-CM | POA: Diagnosis not present

## 2017-09-01 DIAGNOSIS — I5032 Chronic diastolic (congestive) heart failure: Secondary | ICD-10-CM | POA: Diagnosis not present

## 2017-09-04 DIAGNOSIS — R451 Restlessness and agitation: Secondary | ICD-10-CM | POA: Diagnosis not present

## 2017-09-04 DIAGNOSIS — G301 Alzheimer's disease with late onset: Secondary | ICD-10-CM | POA: Diagnosis not present

## 2017-09-04 DIAGNOSIS — F419 Anxiety disorder, unspecified: Secondary | ICD-10-CM | POA: Diagnosis not present

## 2017-09-04 DIAGNOSIS — F0281 Dementia in other diseases classified elsewhere with behavioral disturbance: Secondary | ICD-10-CM | POA: Diagnosis not present

## 2017-09-08 DIAGNOSIS — R062 Wheezing: Secondary | ICD-10-CM | POA: Diagnosis not present

## 2017-09-08 DIAGNOSIS — R0603 Acute respiratory distress: Secondary | ICD-10-CM | POA: Diagnosis not present

## 2017-09-08 DIAGNOSIS — I517 Cardiomegaly: Secondary | ICD-10-CM | POA: Diagnosis not present

## 2017-09-09 DIAGNOSIS — F339 Major depressive disorder, recurrent, unspecified: Secondary | ICD-10-CM | POA: Diagnosis not present

## 2017-09-09 DIAGNOSIS — I10 Essential (primary) hypertension: Secondary | ICD-10-CM | POA: Diagnosis not present

## 2017-09-09 DIAGNOSIS — F419 Anxiety disorder, unspecified: Secondary | ICD-10-CM | POA: Diagnosis not present

## 2017-09-09 DIAGNOSIS — I5032 Chronic diastolic (congestive) heart failure: Secondary | ICD-10-CM | POA: Diagnosis not present

## 2017-09-21 DIAGNOSIS — F419 Anxiety disorder, unspecified: Secondary | ICD-10-CM | POA: Diagnosis not present

## 2017-09-21 DIAGNOSIS — F0281 Dementia in other diseases classified elsewhere with behavioral disturbance: Secondary | ICD-10-CM | POA: Diagnosis not present

## 2017-09-21 DIAGNOSIS — G309 Alzheimer's disease, unspecified: Secondary | ICD-10-CM | POA: Diagnosis not present

## 2017-09-27 DIAGNOSIS — F419 Anxiety disorder, unspecified: Secondary | ICD-10-CM | POA: Diagnosis not present

## 2017-09-27 DIAGNOSIS — F339 Major depressive disorder, recurrent, unspecified: Secondary | ICD-10-CM | POA: Diagnosis not present

## 2017-09-29 DIAGNOSIS — I6359 Cerebral infarction due to unspecified occlusion or stenosis of other cerebral artery: Secondary | ICD-10-CM | POA: Diagnosis not present

## 2017-09-29 DIAGNOSIS — I10 Essential (primary) hypertension: Secondary | ICD-10-CM | POA: Diagnosis not present

## 2017-09-29 DIAGNOSIS — E119 Type 2 diabetes mellitus without complications: Secondary | ICD-10-CM | POA: Diagnosis not present

## 2017-09-29 DIAGNOSIS — G301 Alzheimer's disease with late onset: Secondary | ICD-10-CM | POA: Diagnosis not present

## 2017-09-30 DIAGNOSIS — I10 Essential (primary) hypertension: Secondary | ICD-10-CM | POA: Diagnosis not present

## 2017-09-30 DIAGNOSIS — I6359 Cerebral infarction due to unspecified occlusion or stenosis of other cerebral artery: Secondary | ICD-10-CM | POA: Diagnosis not present

## 2017-09-30 DIAGNOSIS — I5032 Chronic diastolic (congestive) heart failure: Secondary | ICD-10-CM | POA: Diagnosis not present

## 2017-09-30 DIAGNOSIS — G309 Alzheimer's disease, unspecified: Secondary | ICD-10-CM | POA: Diagnosis not present

## 2017-10-02 DIAGNOSIS — E119 Type 2 diabetes mellitus without complications: Secondary | ICD-10-CM | POA: Diagnosis not present

## 2017-10-02 DIAGNOSIS — E559 Vitamin D deficiency, unspecified: Secondary | ICD-10-CM | POA: Diagnosis not present

## 2017-10-02 DIAGNOSIS — E039 Hypothyroidism, unspecified: Secondary | ICD-10-CM | POA: Diagnosis not present

## 2017-10-02 DIAGNOSIS — Z79899 Other long term (current) drug therapy: Secondary | ICD-10-CM | POA: Diagnosis not present

## 2017-10-02 DIAGNOSIS — E785 Hyperlipidemia, unspecified: Secondary | ICD-10-CM | POA: Diagnosis not present

## 2017-10-02 DEATH — deceased

## 2017-10-26 DIAGNOSIS — I5032 Chronic diastolic (congestive) heart failure: Secondary | ICD-10-CM | POA: Diagnosis not present

## 2017-10-26 DIAGNOSIS — I6359 Cerebral infarction due to unspecified occlusion or stenosis of other cerebral artery: Secondary | ICD-10-CM | POA: Diagnosis not present

## 2017-10-26 DIAGNOSIS — I10 Essential (primary) hypertension: Secondary | ICD-10-CM | POA: Diagnosis not present

## 2017-10-26 DIAGNOSIS — E119 Type 2 diabetes mellitus without complications: Secondary | ICD-10-CM | POA: Diagnosis not present

## 2017-10-27 DIAGNOSIS — I10 Essential (primary) hypertension: Secondary | ICD-10-CM | POA: Diagnosis not present

## 2017-10-27 DIAGNOSIS — I5032 Chronic diastolic (congestive) heart failure: Secondary | ICD-10-CM | POA: Diagnosis not present

## 2017-10-27 DIAGNOSIS — G301 Alzheimer's disease with late onset: Secondary | ICD-10-CM | POA: Diagnosis not present

## 2017-10-27 DIAGNOSIS — E119 Type 2 diabetes mellitus without complications: Secondary | ICD-10-CM | POA: Diagnosis not present

## 2017-11-10 DIAGNOSIS — R0603 Acute respiratory distress: Secondary | ICD-10-CM | POA: Diagnosis not present

## 2017-11-10 DIAGNOSIS — E119 Type 2 diabetes mellitus without complications: Secondary | ICD-10-CM | POA: Diagnosis not present

## 2017-11-20 DIAGNOSIS — E1165 Type 2 diabetes mellitus with hyperglycemia: Secondary | ICD-10-CM | POA: Diagnosis not present

## 2017-11-20 DIAGNOSIS — I5032 Chronic diastolic (congestive) heart failure: Secondary | ICD-10-CM | POA: Diagnosis not present

## 2017-11-20 DIAGNOSIS — I10 Essential (primary) hypertension: Secondary | ICD-10-CM | POA: Diagnosis not present

## 2017-11-20 DIAGNOSIS — G301 Alzheimer's disease with late onset: Secondary | ICD-10-CM | POA: Diagnosis not present

## 2017-11-24 DIAGNOSIS — E119 Type 2 diabetes mellitus without complications: Secondary | ICD-10-CM | POA: Diagnosis not present

## 2017-11-24 DIAGNOSIS — G301 Alzheimer's disease with late onset: Secondary | ICD-10-CM | POA: Diagnosis not present

## 2017-11-24 DIAGNOSIS — I5032 Chronic diastolic (congestive) heart failure: Secondary | ICD-10-CM | POA: Diagnosis not present

## 2017-11-24 DIAGNOSIS — I10 Essential (primary) hypertension: Secondary | ICD-10-CM | POA: Diagnosis not present

## 2017-11-30 DIAGNOSIS — F0390 Unspecified dementia without behavioral disturbance: Secondary | ICD-10-CM | POA: Diagnosis not present

## 2017-11-30 DIAGNOSIS — F329 Major depressive disorder, single episode, unspecified: Secondary | ICD-10-CM | POA: Diagnosis not present

## 2017-11-30 DIAGNOSIS — F411 Generalized anxiety disorder: Secondary | ICD-10-CM | POA: Diagnosis not present

## 2017-12-14 DIAGNOSIS — F0390 Unspecified dementia without behavioral disturbance: Secondary | ICD-10-CM | POA: Diagnosis not present

## 2017-12-14 DIAGNOSIS — F411 Generalized anxiety disorder: Secondary | ICD-10-CM | POA: Diagnosis not present

## 2017-12-14 DIAGNOSIS — F329 Major depressive disorder, single episode, unspecified: Secondary | ICD-10-CM | POA: Diagnosis not present

## 2017-12-15 DIAGNOSIS — I10 Essential (primary) hypertension: Secondary | ICD-10-CM | POA: Diagnosis not present

## 2017-12-22 DIAGNOSIS — E119 Type 2 diabetes mellitus without complications: Secondary | ICD-10-CM | POA: Diagnosis not present

## 2017-12-22 DIAGNOSIS — G301 Alzheimer's disease with late onset: Secondary | ICD-10-CM | POA: Diagnosis not present

## 2017-12-22 DIAGNOSIS — I10 Essential (primary) hypertension: Secondary | ICD-10-CM | POA: Diagnosis not present

## 2017-12-22 DIAGNOSIS — I5032 Chronic diastolic (congestive) heart failure: Secondary | ICD-10-CM | POA: Diagnosis not present

## 2017-12-29 DIAGNOSIS — E1165 Type 2 diabetes mellitus with hyperglycemia: Secondary | ICD-10-CM | POA: Diagnosis not present

## 2017-12-29 DIAGNOSIS — I10 Essential (primary) hypertension: Secondary | ICD-10-CM | POA: Diagnosis not present

## 2017-12-29 DIAGNOSIS — G309 Alzheimer's disease, unspecified: Secondary | ICD-10-CM | POA: Diagnosis not present

## 2017-12-29 DIAGNOSIS — I5032 Chronic diastolic (congestive) heart failure: Secondary | ICD-10-CM | POA: Diagnosis not present

## 2018-01-08 DIAGNOSIS — E039 Hypothyroidism, unspecified: Secondary | ICD-10-CM | POA: Diagnosis not present

## 2018-01-08 DIAGNOSIS — E785 Hyperlipidemia, unspecified: Secondary | ICD-10-CM | POA: Diagnosis not present

## 2018-01-08 DIAGNOSIS — E559 Vitamin D deficiency, unspecified: Secondary | ICD-10-CM | POA: Diagnosis not present

## 2018-01-08 DIAGNOSIS — E119 Type 2 diabetes mellitus without complications: Secondary | ICD-10-CM | POA: Diagnosis not present

## 2018-01-08 DIAGNOSIS — Z79899 Other long term (current) drug therapy: Secondary | ICD-10-CM | POA: Diagnosis not present

## 2018-01-12 DIAGNOSIS — G309 Alzheimer's disease, unspecified: Secondary | ICD-10-CM | POA: Diagnosis not present

## 2018-01-12 DIAGNOSIS — Z741 Need for assistance with personal care: Secondary | ICD-10-CM | POA: Diagnosis not present

## 2018-01-15 DIAGNOSIS — G309 Alzheimer's disease, unspecified: Secondary | ICD-10-CM | POA: Diagnosis not present

## 2018-01-15 DIAGNOSIS — Z741 Need for assistance with personal care: Secondary | ICD-10-CM | POA: Diagnosis not present

## 2018-01-16 DIAGNOSIS — G309 Alzheimer's disease, unspecified: Secondary | ICD-10-CM | POA: Diagnosis not present

## 2018-01-16 DIAGNOSIS — Z741 Need for assistance with personal care: Secondary | ICD-10-CM | POA: Diagnosis not present

## 2018-01-17 DIAGNOSIS — Z741 Need for assistance with personal care: Secondary | ICD-10-CM | POA: Diagnosis not present

## 2018-01-17 DIAGNOSIS — G309 Alzheimer's disease, unspecified: Secondary | ICD-10-CM | POA: Diagnosis not present

## 2018-01-18 DIAGNOSIS — G309 Alzheimer's disease, unspecified: Secondary | ICD-10-CM | POA: Diagnosis not present

## 2018-01-18 DIAGNOSIS — M25561 Pain in right knee: Secondary | ICD-10-CM | POA: Diagnosis not present

## 2018-01-18 DIAGNOSIS — M25562 Pain in left knee: Secondary | ICD-10-CM | POA: Diagnosis not present

## 2018-01-18 DIAGNOSIS — Z741 Need for assistance with personal care: Secondary | ICD-10-CM | POA: Diagnosis not present

## 2018-01-18 DIAGNOSIS — F411 Generalized anxiety disorder: Secondary | ICD-10-CM | POA: Diagnosis not present

## 2018-01-18 DIAGNOSIS — J3489 Other specified disorders of nose and nasal sinuses: Secondary | ICD-10-CM | POA: Diagnosis not present

## 2018-01-18 DIAGNOSIS — F0390 Unspecified dementia without behavioral disturbance: Secondary | ICD-10-CM | POA: Diagnosis not present

## 2018-01-18 DIAGNOSIS — M170 Bilateral primary osteoarthritis of knee: Secondary | ICD-10-CM | POA: Diagnosis not present

## 2018-01-18 DIAGNOSIS — F329 Major depressive disorder, single episode, unspecified: Secondary | ICD-10-CM | POA: Diagnosis not present

## 2018-01-19 DIAGNOSIS — Z741 Need for assistance with personal care: Secondary | ICD-10-CM | POA: Diagnosis not present

## 2018-01-19 DIAGNOSIS — G309 Alzheimer's disease, unspecified: Secondary | ICD-10-CM | POA: Diagnosis not present

## 2018-01-21 DIAGNOSIS — I6359 Cerebral infarction due to unspecified occlusion or stenosis of other cerebral artery: Secondary | ICD-10-CM | POA: Diagnosis not present

## 2018-01-21 DIAGNOSIS — I5032 Chronic diastolic (congestive) heart failure: Secondary | ICD-10-CM | POA: Diagnosis not present

## 2018-01-21 DIAGNOSIS — E119 Type 2 diabetes mellitus without complications: Secondary | ICD-10-CM | POA: Diagnosis not present

## 2018-01-21 DIAGNOSIS — I10 Essential (primary) hypertension: Secondary | ICD-10-CM | POA: Diagnosis not present

## 2018-01-22 DIAGNOSIS — Z741 Need for assistance with personal care: Secondary | ICD-10-CM | POA: Diagnosis not present

## 2018-01-22 DIAGNOSIS — G309 Alzheimer's disease, unspecified: Secondary | ICD-10-CM | POA: Diagnosis not present

## 2018-01-23 DIAGNOSIS — G309 Alzheimer's disease, unspecified: Secondary | ICD-10-CM | POA: Diagnosis not present

## 2018-01-23 DIAGNOSIS — Z741 Need for assistance with personal care: Secondary | ICD-10-CM | POA: Diagnosis not present

## 2018-01-24 DIAGNOSIS — G309 Alzheimer's disease, unspecified: Secondary | ICD-10-CM | POA: Diagnosis not present

## 2018-01-24 DIAGNOSIS — Z741 Need for assistance with personal care: Secondary | ICD-10-CM | POA: Diagnosis not present

## 2018-01-25 DIAGNOSIS — G309 Alzheimer's disease, unspecified: Secondary | ICD-10-CM | POA: Diagnosis not present

## 2018-01-25 DIAGNOSIS — Z741 Need for assistance with personal care: Secondary | ICD-10-CM | POA: Diagnosis not present

## 2018-01-26 DIAGNOSIS — Z741 Need for assistance with personal care: Secondary | ICD-10-CM | POA: Diagnosis not present

## 2018-01-26 DIAGNOSIS — G309 Alzheimer's disease, unspecified: Secondary | ICD-10-CM | POA: Diagnosis not present

## 2018-01-29 DIAGNOSIS — Z741 Need for assistance with personal care: Secondary | ICD-10-CM | POA: Diagnosis not present

## 2018-01-29 DIAGNOSIS — G309 Alzheimer's disease, unspecified: Secondary | ICD-10-CM | POA: Diagnosis not present

## 2018-01-30 DIAGNOSIS — G309 Alzheimer's disease, unspecified: Secondary | ICD-10-CM | POA: Diagnosis not present

## 2018-01-30 DIAGNOSIS — Z741 Need for assistance with personal care: Secondary | ICD-10-CM | POA: Diagnosis not present

## 2018-01-31 DIAGNOSIS — Z741 Need for assistance with personal care: Secondary | ICD-10-CM | POA: Diagnosis not present

## 2018-01-31 DIAGNOSIS — G309 Alzheimer's disease, unspecified: Secondary | ICD-10-CM | POA: Diagnosis not present

## 2018-02-11 DIAGNOSIS — I10 Essential (primary) hypertension: Secondary | ICD-10-CM | POA: Diagnosis not present

## 2018-02-11 DIAGNOSIS — I6359 Cerebral infarction due to unspecified occlusion or stenosis of other cerebral artery: Secondary | ICD-10-CM | POA: Diagnosis not present

## 2018-02-11 DIAGNOSIS — I5032 Chronic diastolic (congestive) heart failure: Secondary | ICD-10-CM | POA: Diagnosis not present

## 2018-02-11 DIAGNOSIS — E119 Type 2 diabetes mellitus without complications: Secondary | ICD-10-CM | POA: Diagnosis not present

## 2018-02-15 DIAGNOSIS — I5032 Chronic diastolic (congestive) heart failure: Secondary | ICD-10-CM | POA: Diagnosis not present

## 2018-02-15 DIAGNOSIS — E559 Vitamin D deficiency, unspecified: Secondary | ICD-10-CM | POA: Diagnosis not present

## 2018-02-15 DIAGNOSIS — I10 Essential (primary) hypertension: Secondary | ICD-10-CM | POA: Diagnosis not present

## 2018-02-15 DIAGNOSIS — G301 Alzheimer's disease with late onset: Secondary | ICD-10-CM | POA: Diagnosis not present

## 2018-02-22 DIAGNOSIS — F411 Generalized anxiety disorder: Secondary | ICD-10-CM | POA: Diagnosis not present

## 2018-02-22 DIAGNOSIS — F329 Major depressive disorder, single episode, unspecified: Secondary | ICD-10-CM | POA: Diagnosis not present

## 2018-02-22 DIAGNOSIS — F0390 Unspecified dementia without behavioral disturbance: Secondary | ICD-10-CM | POA: Diagnosis not present

## 2018-03-20 DIAGNOSIS — I10 Essential (primary) hypertension: Secondary | ICD-10-CM | POA: Diagnosis not present

## 2018-03-20 DIAGNOSIS — E559 Vitamin D deficiency, unspecified: Secondary | ICD-10-CM | POA: Diagnosis not present

## 2018-03-20 DIAGNOSIS — I5032 Chronic diastolic (congestive) heart failure: Secondary | ICD-10-CM | POA: Diagnosis not present

## 2018-03-20 DIAGNOSIS — G301 Alzheimer's disease with late onset: Secondary | ICD-10-CM | POA: Diagnosis not present

## 2018-03-22 DIAGNOSIS — F329 Major depressive disorder, single episode, unspecified: Secondary | ICD-10-CM | POA: Diagnosis not present

## 2018-03-22 DIAGNOSIS — F411 Generalized anxiety disorder: Secondary | ICD-10-CM | POA: Diagnosis not present

## 2018-03-22 DIAGNOSIS — F0390 Unspecified dementia without behavioral disturbance: Secondary | ICD-10-CM | POA: Diagnosis not present

## 2018-03-23 DIAGNOSIS — I10 Essential (primary) hypertension: Secondary | ICD-10-CM | POA: Diagnosis not present

## 2018-03-23 DIAGNOSIS — I6359 Cerebral infarction due to unspecified occlusion or stenosis of other cerebral artery: Secondary | ICD-10-CM | POA: Diagnosis not present

## 2018-03-23 DIAGNOSIS — I5032 Chronic diastolic (congestive) heart failure: Secondary | ICD-10-CM | POA: Diagnosis not present

## 2018-03-23 DIAGNOSIS — E119 Type 2 diabetes mellitus without complications: Secondary | ICD-10-CM | POA: Diagnosis not present

## 2018-03-27 DIAGNOSIS — I10 Essential (primary) hypertension: Secondary | ICD-10-CM | POA: Diagnosis not present

## 2018-03-27 DIAGNOSIS — G301 Alzheimer's disease with late onset: Secondary | ICD-10-CM | POA: Diagnosis not present

## 2018-03-27 DIAGNOSIS — F329 Major depressive disorder, single episode, unspecified: Secondary | ICD-10-CM | POA: Diagnosis not present

## 2018-03-27 DIAGNOSIS — F419 Anxiety disorder, unspecified: Secondary | ICD-10-CM | POA: Diagnosis not present

## 2018-04-06 DIAGNOSIS — F0390 Unspecified dementia without behavioral disturbance: Secondary | ICD-10-CM | POA: Diagnosis not present

## 2018-04-06 DIAGNOSIS — F411 Generalized anxiety disorder: Secondary | ICD-10-CM | POA: Diagnosis not present

## 2018-04-06 DIAGNOSIS — F329 Major depressive disorder, single episode, unspecified: Secondary | ICD-10-CM | POA: Diagnosis not present

## 2018-04-09 DIAGNOSIS — E785 Hyperlipidemia, unspecified: Secondary | ICD-10-CM | POA: Diagnosis not present

## 2018-04-09 DIAGNOSIS — Z79899 Other long term (current) drug therapy: Secondary | ICD-10-CM | POA: Diagnosis not present

## 2018-04-09 DIAGNOSIS — E119 Type 2 diabetes mellitus without complications: Secondary | ICD-10-CM | POA: Diagnosis not present

## 2018-04-09 DIAGNOSIS — E559 Vitamin D deficiency, unspecified: Secondary | ICD-10-CM | POA: Diagnosis not present

## 2018-04-09 DIAGNOSIS — E039 Hypothyroidism, unspecified: Secondary | ICD-10-CM | POA: Diagnosis not present

## 2018-04-13 DIAGNOSIS — F329 Major depressive disorder, single episode, unspecified: Secondary | ICD-10-CM | POA: Diagnosis not present

## 2018-04-13 DIAGNOSIS — G301 Alzheimer's disease with late onset: Secondary | ICD-10-CM | POA: Diagnosis not present

## 2018-04-13 DIAGNOSIS — R41840 Attention and concentration deficit: Secondary | ICD-10-CM | POA: Diagnosis not present

## 2018-04-13 DIAGNOSIS — R41841 Cognitive communication deficit: Secondary | ICD-10-CM | POA: Diagnosis not present

## 2018-04-13 DIAGNOSIS — I639 Cerebral infarction, unspecified: Secondary | ICD-10-CM | POA: Diagnosis not present

## 2018-04-16 DIAGNOSIS — F329 Major depressive disorder, single episode, unspecified: Secondary | ICD-10-CM | POA: Diagnosis not present

## 2018-04-16 DIAGNOSIS — G301 Alzheimer's disease with late onset: Secondary | ICD-10-CM | POA: Diagnosis not present

## 2018-04-16 DIAGNOSIS — I639 Cerebral infarction, unspecified: Secondary | ICD-10-CM | POA: Diagnosis not present

## 2018-04-16 DIAGNOSIS — R41840 Attention and concentration deficit: Secondary | ICD-10-CM | POA: Diagnosis not present

## 2018-04-16 DIAGNOSIS — R41841 Cognitive communication deficit: Secondary | ICD-10-CM | POA: Diagnosis not present

## 2018-04-17 DIAGNOSIS — G301 Alzheimer's disease with late onset: Secondary | ICD-10-CM | POA: Diagnosis not present

## 2018-04-17 DIAGNOSIS — R41841 Cognitive communication deficit: Secondary | ICD-10-CM | POA: Diagnosis not present

## 2018-04-17 DIAGNOSIS — I639 Cerebral infarction, unspecified: Secondary | ICD-10-CM | POA: Diagnosis not present

## 2018-04-17 DIAGNOSIS — F329 Major depressive disorder, single episode, unspecified: Secondary | ICD-10-CM | POA: Diagnosis not present

## 2018-04-17 DIAGNOSIS — R41840 Attention and concentration deficit: Secondary | ICD-10-CM | POA: Diagnosis not present

## 2018-04-18 DIAGNOSIS — G301 Alzheimer's disease with late onset: Secondary | ICD-10-CM | POA: Diagnosis not present

## 2018-04-18 DIAGNOSIS — R41841 Cognitive communication deficit: Secondary | ICD-10-CM | POA: Diagnosis not present

## 2018-04-18 DIAGNOSIS — F329 Major depressive disorder, single episode, unspecified: Secondary | ICD-10-CM | POA: Diagnosis not present

## 2018-04-18 DIAGNOSIS — I639 Cerebral infarction, unspecified: Secondary | ICD-10-CM | POA: Diagnosis not present

## 2018-04-18 DIAGNOSIS — R41840 Attention and concentration deficit: Secondary | ICD-10-CM | POA: Diagnosis not present

## 2018-04-19 DIAGNOSIS — R41841 Cognitive communication deficit: Secondary | ICD-10-CM | POA: Diagnosis not present

## 2018-04-19 DIAGNOSIS — F0150 Vascular dementia without behavioral disturbance: Secondary | ICD-10-CM | POA: Diagnosis not present

## 2018-04-19 DIAGNOSIS — R600 Localized edema: Secondary | ICD-10-CM | POA: Diagnosis not present

## 2018-04-19 DIAGNOSIS — I639 Cerebral infarction, unspecified: Secondary | ICD-10-CM | POA: Diagnosis not present

## 2018-04-19 DIAGNOSIS — F329 Major depressive disorder, single episode, unspecified: Secondary | ICD-10-CM | POA: Diagnosis not present

## 2018-04-19 DIAGNOSIS — I10 Essential (primary) hypertension: Secondary | ICD-10-CM | POA: Diagnosis not present

## 2018-04-19 DIAGNOSIS — R41840 Attention and concentration deficit: Secondary | ICD-10-CM | POA: Diagnosis not present

## 2018-04-19 DIAGNOSIS — E1165 Type 2 diabetes mellitus with hyperglycemia: Secondary | ICD-10-CM | POA: Diagnosis not present

## 2018-04-19 DIAGNOSIS — I5032 Chronic diastolic (congestive) heart failure: Secondary | ICD-10-CM | POA: Diagnosis not present

## 2018-04-19 DIAGNOSIS — G301 Alzheimer's disease with late onset: Secondary | ICD-10-CM | POA: Diagnosis not present

## 2018-04-23 DIAGNOSIS — R41841 Cognitive communication deficit: Secondary | ICD-10-CM | POA: Diagnosis not present

## 2018-04-23 DIAGNOSIS — F329 Major depressive disorder, single episode, unspecified: Secondary | ICD-10-CM | POA: Diagnosis not present

## 2018-04-23 DIAGNOSIS — G301 Alzheimer's disease with late onset: Secondary | ICD-10-CM | POA: Diagnosis not present

## 2018-04-23 DIAGNOSIS — R41840 Attention and concentration deficit: Secondary | ICD-10-CM | POA: Diagnosis not present

## 2018-04-23 DIAGNOSIS — I639 Cerebral infarction, unspecified: Secondary | ICD-10-CM | POA: Diagnosis not present

## 2018-04-24 DIAGNOSIS — R41840 Attention and concentration deficit: Secondary | ICD-10-CM | POA: Diagnosis not present

## 2018-04-24 DIAGNOSIS — F329 Major depressive disorder, single episode, unspecified: Secondary | ICD-10-CM | POA: Diagnosis not present

## 2018-04-24 DIAGNOSIS — F419 Anxiety disorder, unspecified: Secondary | ICD-10-CM | POA: Diagnosis not present

## 2018-04-24 DIAGNOSIS — F0150 Vascular dementia without behavioral disturbance: Secondary | ICD-10-CM | POA: Diagnosis not present

## 2018-04-24 DIAGNOSIS — I639 Cerebral infarction, unspecified: Secondary | ICD-10-CM | POA: Diagnosis not present

## 2018-04-24 DIAGNOSIS — R41841 Cognitive communication deficit: Secondary | ICD-10-CM | POA: Diagnosis not present

## 2018-04-24 DIAGNOSIS — G301 Alzheimer's disease with late onset: Secondary | ICD-10-CM | POA: Diagnosis not present

## 2018-04-25 DIAGNOSIS — D649 Anemia, unspecified: Secondary | ICD-10-CM | POA: Diagnosis not present

## 2018-04-25 DIAGNOSIS — R41840 Attention and concentration deficit: Secondary | ICD-10-CM | POA: Diagnosis not present

## 2018-04-25 DIAGNOSIS — I639 Cerebral infarction, unspecified: Secondary | ICD-10-CM | POA: Diagnosis not present

## 2018-04-25 DIAGNOSIS — F329 Major depressive disorder, single episode, unspecified: Secondary | ICD-10-CM | POA: Diagnosis not present

## 2018-04-25 DIAGNOSIS — R41841 Cognitive communication deficit: Secondary | ICD-10-CM | POA: Diagnosis not present

## 2018-04-25 DIAGNOSIS — G301 Alzheimer's disease with late onset: Secondary | ICD-10-CM | POA: Diagnosis not present

## 2018-04-25 DIAGNOSIS — Z79899 Other long term (current) drug therapy: Secondary | ICD-10-CM | POA: Diagnosis not present

## 2018-04-26 DIAGNOSIS — G301 Alzheimer's disease with late onset: Secondary | ICD-10-CM | POA: Diagnosis not present

## 2018-04-26 DIAGNOSIS — R41840 Attention and concentration deficit: Secondary | ICD-10-CM | POA: Diagnosis not present

## 2018-04-26 DIAGNOSIS — I639 Cerebral infarction, unspecified: Secondary | ICD-10-CM | POA: Diagnosis not present

## 2018-04-26 DIAGNOSIS — F329 Major depressive disorder, single episode, unspecified: Secondary | ICD-10-CM | POA: Diagnosis not present

## 2018-04-26 DIAGNOSIS — R41841 Cognitive communication deficit: Secondary | ICD-10-CM | POA: Diagnosis not present

## 2018-04-28 DIAGNOSIS — R52 Pain, unspecified: Secondary | ICD-10-CM | POA: Diagnosis not present

## 2018-04-28 DIAGNOSIS — R404 Transient alteration of awareness: Secondary | ICD-10-CM | POA: Diagnosis not present

## 2018-04-28 DIAGNOSIS — I214 Non-ST elevation (NSTEMI) myocardial infarction: Secondary | ICD-10-CM | POA: Diagnosis not present

## 2018-04-28 DIAGNOSIS — I10 Essential (primary) hypertension: Secondary | ICD-10-CM | POA: Diagnosis not present

## 2018-04-28 DIAGNOSIS — S299XXA Unspecified injury of thorax, initial encounter: Secondary | ICD-10-CM | POA: Diagnosis not present

## 2018-04-28 DIAGNOSIS — R51 Headache: Secondary | ICD-10-CM | POA: Diagnosis not present

## 2018-04-28 DIAGNOSIS — F0390 Unspecified dementia without behavioral disturbance: Secondary | ICD-10-CM | POA: Diagnosis not present

## 2018-04-28 DIAGNOSIS — M25562 Pain in left knee: Secondary | ICD-10-CM | POA: Diagnosis not present

## 2018-04-28 DIAGNOSIS — W19XXXA Unspecified fall, initial encounter: Secondary | ICD-10-CM | POA: Diagnosis not present

## 2018-04-28 DIAGNOSIS — R410 Disorientation, unspecified: Secondary | ICD-10-CM | POA: Diagnosis not present

## 2018-04-29 ENCOUNTER — Inpatient Hospital Stay
Admission: AD | Admit: 2018-04-29 | Payer: Self-pay | Source: Other Acute Inpatient Hospital | Admitting: Internal Medicine

## 2018-04-29 DIAGNOSIS — I10 Essential (primary) hypertension: Secondary | ICD-10-CM | POA: Diagnosis not present

## 2018-04-29 DIAGNOSIS — I2510 Atherosclerotic heart disease of native coronary artery without angina pectoris: Secondary | ICD-10-CM | POA: Diagnosis present

## 2018-04-29 DIAGNOSIS — J811 Chronic pulmonary edema: Secondary | ICD-10-CM | POA: Diagnosis not present

## 2018-04-29 DIAGNOSIS — F419 Anxiety disorder, unspecified: Secondary | ICD-10-CM | POA: Diagnosis present

## 2018-04-29 DIAGNOSIS — G301 Alzheimer's disease with late onset: Secondary | ICD-10-CM | POA: Diagnosis not present

## 2018-04-29 DIAGNOSIS — I5023 Acute on chronic systolic (congestive) heart failure: Secondary | ICD-10-CM | POA: Diagnosis present

## 2018-04-29 DIAGNOSIS — I214 Non-ST elevation (NSTEMI) myocardial infarction: Secondary | ICD-10-CM | POA: Diagnosis present

## 2018-04-29 DIAGNOSIS — E119 Type 2 diabetes mellitus without complications: Secondary | ICD-10-CM | POA: Diagnosis present

## 2018-04-29 DIAGNOSIS — I871 Compression of vein: Secondary | ICD-10-CM | POA: Diagnosis not present

## 2018-04-29 DIAGNOSIS — J9601 Acute respiratory failure with hypoxia: Secondary | ICD-10-CM | POA: Diagnosis present

## 2018-04-29 DIAGNOSIS — I4891 Unspecified atrial fibrillation: Secondary | ICD-10-CM | POA: Diagnosis not present

## 2018-04-29 DIAGNOSIS — R9431 Abnormal electrocardiogram [ECG] [EKG]: Secondary | ICD-10-CM | POA: Diagnosis not present

## 2018-04-29 DIAGNOSIS — I493 Ventricular premature depolarization: Secondary | ICD-10-CM | POA: Diagnosis not present

## 2018-04-29 DIAGNOSIS — Z436 Encounter for attention to other artificial openings of urinary tract: Secondary | ICD-10-CM | POA: Diagnosis not present

## 2018-04-29 DIAGNOSIS — R7989 Other specified abnormal findings of blood chemistry: Secondary | ICD-10-CM | POA: Diagnosis not present

## 2018-04-29 DIAGNOSIS — F418 Other specified anxiety disorders: Secondary | ICD-10-CM | POA: Diagnosis not present

## 2018-04-29 DIAGNOSIS — J189 Pneumonia, unspecified organism: Secondary | ICD-10-CM | POA: Diagnosis present

## 2018-04-29 DIAGNOSIS — J80 Acute respiratory distress syndrome: Secondary | ICD-10-CM | POA: Diagnosis not present

## 2018-04-29 DIAGNOSIS — E8770 Fluid overload, unspecified: Secondary | ICD-10-CM | POA: Diagnosis not present

## 2018-04-29 DIAGNOSIS — G309 Alzheimer's disease, unspecified: Secondary | ICD-10-CM | POA: Diagnosis present

## 2018-04-29 DIAGNOSIS — I447 Left bundle-branch block, unspecified: Secondary | ICD-10-CM | POA: Diagnosis not present

## 2018-04-29 DIAGNOSIS — R0689 Other abnormalities of breathing: Secondary | ICD-10-CM | POA: Diagnosis not present

## 2018-04-29 DIAGNOSIS — I252 Old myocardial infarction: Secondary | ICD-10-CM | POA: Diagnosis not present

## 2018-04-29 DIAGNOSIS — R0600 Dyspnea, unspecified: Secondary | ICD-10-CM | POA: Diagnosis not present

## 2018-04-29 DIAGNOSIS — Z66 Do not resuscitate: Secondary | ICD-10-CM | POA: Diagnosis not present

## 2018-04-29 DIAGNOSIS — R0601 Orthopnea: Secondary | ICD-10-CM | POA: Diagnosis not present

## 2018-04-29 DIAGNOSIS — Z515 Encounter for palliative care: Secondary | ICD-10-CM | POA: Diagnosis not present

## 2018-04-29 DIAGNOSIS — F0280 Dementia in other diseases classified elsewhere without behavioral disturbance: Secondary | ICD-10-CM | POA: Diagnosis present

## 2018-04-29 DIAGNOSIS — Z87891 Personal history of nicotine dependence: Secondary | ICD-10-CM | POA: Diagnosis not present

## 2018-04-29 DIAGNOSIS — R0602 Shortness of breath: Secondary | ICD-10-CM | POA: Diagnosis not present

## 2018-04-29 DIAGNOSIS — I509 Heart failure, unspecified: Secondary | ICD-10-CM | POA: Diagnosis not present

## 2018-04-29 DIAGNOSIS — I110 Hypertensive heart disease with heart failure: Secondary | ICD-10-CM | POA: Diagnosis present

## 2018-05-04 DEATH — deceased
